# Patient Record
Sex: Female | Born: 1970
Health system: Southern US, Community
[De-identification: ages and names within clinical notes are randomized; demographics above are authoritative.]

## PROBLEM LIST (undated history)

## (undated) DIAGNOSIS — S299XXA Unspecified injury of thorax, initial encounter: Secondary | ICD-10-CM

## (undated) DIAGNOSIS — I639 Cerebral infarction, unspecified: Secondary | ICD-10-CM

## (undated) DIAGNOSIS — R519 Headache, unspecified: Secondary | ICD-10-CM

## (undated) DIAGNOSIS — Z973 Presence of spectacles and contact lenses: Secondary | ICD-10-CM

## (undated) DIAGNOSIS — K219 Gastro-esophageal reflux disease without esophagitis: Secondary | ICD-10-CM

## (undated) DIAGNOSIS — M1990 Unspecified osteoarthritis, unspecified site: Secondary | ICD-10-CM

## (undated) DIAGNOSIS — T7840XA Allergy, unspecified, initial encounter: Secondary | ICD-10-CM

## (undated) HISTORY — DX: Headache, unspecified: R51.9

## (undated) HISTORY — DX: Cerebral infarction, unspecified: I63.9

## (undated) HISTORY — DX: Allergy, unspecified, initial encounter: T78.40XA

## (undated) HISTORY — DX: Gastro-esophageal reflux disease without esophagitis: K21.9

---

## 1997-06-08 DIAGNOSIS — I639 Cerebral infarction, unspecified: Secondary | ICD-10-CM

## 1997-06-08 HISTORY — DX: Cerebral infarction, unspecified: I63.9

## 1998-06-08 HISTORY — PX: TUBAL LIGATION: SHX77

## 2000-07-22 ENCOUNTER — Other Ambulatory Visit: Admission: RE | Admit: 2000-07-22 | Discharge: 2000-07-22 | Payer: Self-pay | Admitting: Family Medicine

## 2002-08-24 ENCOUNTER — Encounter: Admission: RE | Admit: 2002-08-24 | Discharge: 2002-08-24 | Payer: Self-pay | Admitting: Orthopedic Surgery

## 2002-08-24 ENCOUNTER — Encounter: Payer: Self-pay | Admitting: Orthopedic Surgery

## 2003-03-28 ENCOUNTER — Encounter
Admission: RE | Admit: 2003-03-28 | Discharge: 2003-06-26 | Payer: Self-pay | Admitting: Physical Medicine & Rehabilitation

## 2004-01-31 ENCOUNTER — Encounter
Admission: RE | Admit: 2004-01-31 | Discharge: 2004-04-30 | Payer: Self-pay | Admitting: Physical Medicine & Rehabilitation

## 2004-07-30 ENCOUNTER — Encounter
Admission: RE | Admit: 2004-07-30 | Discharge: 2004-10-28 | Payer: Self-pay | Admitting: Physical Medicine & Rehabilitation

## 2004-07-31 ENCOUNTER — Ambulatory Visit: Payer: Self-pay | Admitting: Physical Medicine & Rehabilitation

## 2005-01-23 ENCOUNTER — Ambulatory Visit: Payer: Self-pay | Admitting: Physical Medicine & Rehabilitation

## 2005-01-23 ENCOUNTER — Encounter
Admission: RE | Admit: 2005-01-23 | Discharge: 2005-04-23 | Payer: Self-pay | Admitting: Physical Medicine & Rehabilitation

## 2005-07-15 ENCOUNTER — Encounter
Admission: RE | Admit: 2005-07-15 | Discharge: 2005-10-13 | Payer: Self-pay | Admitting: Physical Medicine & Rehabilitation

## 2005-07-15 ENCOUNTER — Ambulatory Visit: Payer: Self-pay | Admitting: Physical Medicine & Rehabilitation

## 2005-11-22 ENCOUNTER — Emergency Department (HOSPITAL_COMMUNITY): Admission: EM | Admit: 2005-11-22 | Discharge: 2005-11-22 | Payer: Self-pay | Admitting: Emergency Medicine

## 2006-01-04 ENCOUNTER — Encounter
Admission: RE | Admit: 2006-01-04 | Discharge: 2006-04-04 | Payer: Self-pay | Admitting: Physical Medicine & Rehabilitation

## 2006-01-04 ENCOUNTER — Ambulatory Visit: Payer: Self-pay | Admitting: Physical Medicine & Rehabilitation

## 2006-02-10 ENCOUNTER — Emergency Department (HOSPITAL_COMMUNITY): Admission: EM | Admit: 2006-02-10 | Discharge: 2006-02-10 | Payer: Self-pay | Admitting: Emergency Medicine

## 2006-06-08 HISTORY — PX: ARTHROSCOPY KNEE W/ DRILLING: SUR92

## 2006-06-22 ENCOUNTER — Encounter
Admission: RE | Admit: 2006-06-22 | Discharge: 2006-09-20 | Payer: Self-pay | Admitting: Physical Medicine & Rehabilitation

## 2006-06-22 ENCOUNTER — Ambulatory Visit: Payer: Self-pay | Admitting: Physical Medicine & Rehabilitation

## 2007-01-20 ENCOUNTER — Ambulatory Visit (HOSPITAL_BASED_OUTPATIENT_CLINIC_OR_DEPARTMENT_OTHER): Admission: RE | Admit: 2007-01-20 | Discharge: 2007-01-20 | Payer: Self-pay | Admitting: Orthopedic Surgery

## 2007-02-09 ENCOUNTER — Encounter: Admission: RE | Admit: 2007-02-09 | Discharge: 2007-02-09 | Payer: Self-pay | Admitting: Orthopedic Surgery

## 2007-06-09 HISTORY — PX: REPLACEMENT TOTAL KNEE: SUR1224

## 2007-06-14 ENCOUNTER — Inpatient Hospital Stay (HOSPITAL_COMMUNITY): Admission: RE | Admit: 2007-06-14 | Discharge: 2007-06-18 | Payer: Self-pay | Admitting: Orthopedic Surgery

## 2007-07-18 ENCOUNTER — Encounter: Admission: RE | Admit: 2007-07-18 | Discharge: 2007-10-16 | Payer: Self-pay | Admitting: Orthopedic Surgery

## 2007-10-17 ENCOUNTER — Encounter: Admission: RE | Admit: 2007-10-17 | Discharge: 2007-11-14 | Payer: Self-pay | Admitting: Orthopedic Surgery

## 2008-02-16 ENCOUNTER — Encounter: Admission: RE | Admit: 2008-02-16 | Discharge: 2008-03-15 | Payer: Self-pay | Admitting: Nurse Practitioner

## 2010-09-04 ENCOUNTER — Other Ambulatory Visit: Payer: Self-pay | Admitting: Family Medicine

## 2010-09-04 DIAGNOSIS — N920 Excessive and frequent menstruation with regular cycle: Secondary | ICD-10-CM

## 2010-09-08 ENCOUNTER — Ambulatory Visit
Admission: RE | Admit: 2010-09-08 | Discharge: 2010-09-08 | Disposition: A | Discharge status: Home / Self Care | Payer: PRIVATE HEALTH INSURANCE | Source: Ambulatory Visit | Attending: Family Medicine | Admitting: Family Medicine

## 2010-09-08 DIAGNOSIS — N920 Excessive and frequent menstruation with regular cycle: Secondary | ICD-10-CM

## 2010-10-21 NOTE — Op Note (Signed)
NAME:  Cheryl Reeves, Cheryl Reeves                 ACCOUNT NO.:  [id number]   MEDICAL RECORD NO.:  [id number]          PATIENT TYPE:  INP   LOCATION:  0002                         FACILITY:  [facility]   PHYSICIAN:  Deidre Ala, M.D.    DATE OF BIRTH:  [birth year]   DATE OF PROCEDURE:  06/14/2007  DATE OF DISCHARGE:                               OPERATIVE REPORT   PREOPERATIVE DIAGNOSIS:  End-stage severe degenerative joint disease  tricompartmental right knee.   POSTOPERATIVE DIAGNOSIS:  End-stage severe degenerative joint disease  tricompartmental right knee.   PROCEDURE:  Right total knee arthroplasty using cemented DePuy  components LCS type with rotating platform with MBT stem.   SURGEON:  Doristine Section.   ASSISTANT:  Phineas Semen, [facility]   ANESTHESIA:  General endotracheal with femoral nerve block.   Cultures none.  Drains were 2 medium Hemovac and 2 Autovac.   ESTIMATED BLOOD LOSS:  100 mL replaced.   Tourniquet time 1 hour 19 minutes.   PATHOLOGIC FINDINGS:  Diva is relatively young female who had her knee  arthroscoped in 2004 for chondromalacia of the femoral condyle and  synovectomy by Dr. Wyline Mood.  The patient had knee pain postoperatively  that continued, pain with some edema in Hoffa's fat pad and ultimately  was seen by me and failed conservative management for continued pain.  On 08/14, we took her to the operating room where we did a jumper's knee  excision on the knee scope.  She had a tender medial scar plica, but she  had a grade 4 defect under the medial tibial plateau that was flapped  upward and unstable.  Very unusual with cartilages peeling off measuring  1 cm x 1.5 cm.  She had a large medial plica with a grade 4 defect on  the medial femoral condyle with a piece of loose cartilage floating in  the joint over on the lateral side close to bone.  The plica was  rubbing.  She had a stellate degenerative anterior medial meniscus tear.  On the medial side, she had ablation  and microfracture on the medial  femoral condyle with debridement of the medial tibial plateau defect and  medial and lateral meniscectomies and abrasion chondroplasty.  She also  is a smoker.  In the postoperative period she did improve with her  jumper's knee tendinitis, but she continued to have significant knee  pain that she probably is going to have to go to total joint replacement  at some point.  She had significant arthritis in the knee and was  continued to have discomfort and the patient ultimately continued to  have pain which did not respond to general on the joint line especially  medially and she was aspirated several times postop but ultimately was  felt that she was going to come to total knee arthroplasty due to her  pain level.  At surgery today she had severe DJD on the medial femoral  condyle, medial tibial plateau and synovitis throughout.  Therefore she  was essentially an end stage even though very young for a multiplicity  of  reasons, she also has had a post partum stroke and was still a  smoker.  She was in the process of quitting smoking and has been on  Tegretol.  Therefore at surgery we replaced her with a standard right  femur.  A #3 revision cemented tray.  We used a universal stem fluted 75  x 14 with MBT stem to give long life to her knee as a young active  person as per my current standard.  We used a 12.5 insert.  We used a 32  mm over dome patella peg and Smart Set cement with Tobramycin two  batches with 1.3 grams per batch.  We had full extension, flexion to 105  degrees with no anterior drawer, stable varus-valgus stressing and good  overall alignment.   PROCEDURE:  With adequate anesthesia obtained using LMA technique after  femoral nerve block.  The patient was placed in supine position.  The  right lower extremity was prepped from the toes to the tourniquet in  standard fashion.  After standard prepping and draping, Esmarch  exsanguination was  used and the tourniquet was let up to 350 mmHg.  The  old midline jumper's knee excision was utilized and the straight  incision was then made over the middle third of the patella and incision  deepened sharply with adequate hemostasis obtained using the Bovie  electrocoagulated.  Small flaps were developed medially and laterally  and then the median parapatellar retinacular incision was made.  Patella  was everted and the fat pad was excised which was rather scarred.  We  then flexed the knee and removed the cruciates and both menisci.  I then  amputated the tibial spine flap and drilled down the intramedullary  canal.  Subsequently reaming up to a 14.  The tibial cutting jig was put  in place and a 2 degrees cut made.  We then sized the femur to a  standard.  We placed the intramedullary guide and used the C clamp, set  it, pinned it, felt it was still very tight for even a 10 and moved it  up 2.5 mm and made the anterior-posterior cuts.  Still tight in flexion  so cut 2.5 more on the tibia side.  That then accommodated a 12.5 in  flexion.  We then placed a 4 degrees valgus distal femoral cutting jig  in place and made that cut, subsequently cut 2 more and fit the 12.5 in  extension and flexion.  We then placed the finishing guide on the distal  femur to make the anterior-posterior chamfer cuts as well as the notch  cut.  We exposed the tibia sized to a 3, drilled centrally down with the  proximal conical reamer and ultimately reamed line-to-line to a 14 and  placed the 14 x 75 stem with a trial in place with the appropriate  rotation.  Then packed it down.  We then placed on the 10 and 12 and  decided we would trial prior to final implant on the rotating platform.  We placed on the femoral component and articulated the knee through a  range of motion.  I then callipered the patella to a 24, cut it down  with the cutting jig to a 15 replacing 8 with a 32 placed 3-peg holes  and then a  trial patella.  All trial components were then removed and  thorough jet lavaged of the knee was carried out as we brought the  components on the field  and checked for sizes.  We then mixed cement  with tobramycin and mixed it in the cement gun.  We then cemented on the  tibial component and impacted it and removed excess cement.  We had pre  assembled the tibial stem.  We then placed on the trial 12 rotating  platform cemented on the femoral component and impacted it and removed  excess cement, held in full extension, felt that was the best way to go  so then placed the 12.5 permanent implant in place and held the knee in  full extension while we removed excess cement from the femur.  We then  cemented on the patella component and packed it and removed excess  cement and held with a clamp until the cement cured.  When the cement  was set additional jet lavage was carried out.  The tourniquet was let  down and bleeding points were cauterized.  We then placed FloSeal in the  wound.  Hemovac drains were then placed in the lateral gutter and  brought out the superior lateral portal.  The Hemovac was hooked up to  that.  The wound was then closed in layers with #1 figure-of-eight  Vicryl sutures on the retinaculum with a running locking oversew of #1  PDS and 0, 2-0 and 3-0 Vicryl was then used on the subcu and a running 3-  0 Monocryl with Steri-Strips were placed.  A bulky sterile compressive  dressing was then applied.  Hemovac was hooked up to Autovac.  The  patient then having tolerated procedure well was awakened, placed in the  knee immobilizer, taken to the recovery room for routine postoperative  care, analgesia and CPM.           ______________________________  V. [name], M.D.     VEP/MEDQ  D:  06/14/2007  T:  06/14/2007  Job:  [id number]   cc:   [facility]

## 2010-10-21 NOTE — Op Note (Signed)
NAME:  Cheryl Reeves, Cheryl Reeves                 ACCOUNT NO.:  [id number]   MEDICAL RECORD NO.:  [id number]          PATIENT TYPE:  AMB   LOCATION:  NESC                         FACILITY:  [facility]   PHYSICIAN:  Deidre Ala, M.D.    DATE OF BIRTH:  18-Aug-1970   DATE OF PROCEDURE:  DATE OF DISCHARGE:                               OPERATIVE REPORT   PREOPERATIVE DIAGNOSES:  1. Right right knee jumpers knee, patellar tendinitis, with anterior      patellofemoral syndrome.  2. Hoffa's fat pad scarring  3. Medial plica.   POSTOPERATIVE DIAGNOSES:  1. Anterior medial meniscus tear.  2. Stellate degenerative unstable lateral meniscus tear.  3. Grade 4 defect of medial femoral condyle under plica.  4. Grade 4 defect with flap, anterior medial tibial plateau, unstable      osteochondral defect.  5. Medial and lateral plicas, scar type.  6. Tight lateral retinaculum.  7. Jumper's knee, patellar tendinitis inferior pole patella, entire      tendon down to tubercle.   PROCEDURE:  1. Right knee operative arthroscopy.  2. Partial lateral and medial meniscectomies.  3. Abrasion chondroplasty, ablation and microfracture of medial      femoral condyle defect and medial tibial plateau defect.  4. Lateral retinacular release.  5. Arthroscopic medial and lateral plica excisions.  6. Open excision of jumper's knee tendinitis of central patellar      tendon with drilling inferior pole patella and tubercle with      central longitudinal repair.   SURGEON:  Doristine Section, M.D.   ASSISTANT:  Phineas Semen, P.A.   ANESTHESIA:  General endotracheal.   CULTURES:  None.   DRAINS:  None.   ESTIMATED BLOOD LOSS:  Minimal.   TOURNIQUET TIME:  1 hour 17 minutes.   PATHOLOGIC FINDINGS AND HISTORY:  Cheryl Reeves has had history of knee pain.  She had surgery November 2007 by Dr. Thurston Hole.  Chondromalacia of the  femoral condyle was debrided, synovectomy carried out.  She did well  until the end of the last year when she  began having increasing pain of  the knee.  Initial evaluation suggested iliotibial band tendinitis,  which ultimately cleared.  She then continued have discomfort even after  the injection of the iliotibial band tendinitis.  The pain is now more  anteriorly, and she had a new MRI which showed edema in Hoffa's fat pad  anteriorly suggesting postsurgical synovitis.  There was an amorphous  signal over the anterior horn lateral meniscus compared with prior  examination.  Everything else looked normal.  The iliotibial band looked  normal.  We injected her with cortisone.  We told her to stop smoking.  She continually was tender over the anterior patella, central third  inferior pole down to the tubercle, a feeling of tightness  retropatellar, tenderness along the medial femoral condyle with a  palpable plica, and her exquisite pain was over the medial femoral  condyle as well as the patellar tendon.  She seemed to have a somewhat  tight lateral retinaculum.  We therefore elected to proceed after  thorough discussion of the fact that this would be a diagnostic maneuver  with arthroscopy, we would do what needed to be taken care of as well as  a classic Bassett type jumper's knee excision and repair.  That is Dr.  Cleophas Dunker of [facility] who wrote this up years ago on the jumpers  knee.  This was thoroughly explained to the patient and family.  We were  surprised to find at surgery an almost quarter-size defect over the  medial femoral condyle directly under the plica down to bone with a  ragged cartilage edge that we debrided smooth and did microfracture on  because it was raw bone.  We removed the plica over top.  We then  explored the anterior medial meniscus, which had  degenerative anterior  horn fraying, but there was a flap that when the probe was put in it,  the anterior one-third of the medial tibial plateau and somewhat  underneath the anterior medial meniscus flapped up off the  cartilage as  if it had dissociated.  This was debrided to a smooth rim defect and  also microfractured.  I think this was her area of greatest  symptomatology.  There was a loose body of cartilage in the lateral  joint that was from the medial femoral condyle.  There was lateral scar  plica.  There was a very tight lateral retinaculum, which was probably  contributing to her feeling of knee pain on flexion and overall  tightness.  The trochlea had some minor defect changes.  She was soft in  the posterior patella, but not broken down.  There was scar tissue in  the anterior notch.  The ACL was intact, but the lateral meniscus had a  significant anterior horn lateral meniscus tear, degenerative type,  unstable from front to the posterior one third that we debrided  completely out and tapered back smooth.  Therefore, she had a lot going  on in the knee.  We then did the central incision open into the patellar  tendon, excised the origin of the central patellar tendon off the  inferior pole, similar to what one does in a tennis elbow type inertial  procedure, and then multiply drilled it was 0.62 K wire to stimulate  neovascularity as well as distal.  I took out a strip of tendon distally  to stimulate healing in the central tendon where she was tender, and  also stripped the peritenon off and left it off for her patellar  tendinitis.  There was some minor necrotic tissue on the inferior pole  of the patella as well as distally.  The rest of the tendon looked  normal.  Also, I checked the Hoffa's fat pad from both the scope side as  well as through the tendon arthrotomy and did not find a pannus of  significant scar tissue that had to be excised along the anterior fat  pad.  I think she was feeling tightness because of the tight lateral  retinaculum.  She did have some synovitis underneath the inferior pole  patella inside the joint, which I resected, which may have been an area  of  tenderness also.  I think she was feeling it just in the insertional  point of the central tendon on the patellar tendon as well as the  tubercle.   PROCEDURE:  With adequate anesthesia obtained using LMA technique, 1  gram of Ancef given IV prophylaxis and another one at tourniquet let  down due  to the open procedure.  The patient was placed in the supine  position.  The right lower extremity was prepped from the malleoli to  the leg holder in the standard fashion.  After standard prepping and  draping, Esmarch exsanguination was used.  The tourniquet was let up 350  mmHg.  Superior lateral inflow portal was made, and the knee was  insufflated with normal saline with the arthroscopic pump.  Medial and  lateral scope portals were then made, and the joint was thoroughly  inspected.  I then shaved the medial femoral condyle defect directly  under the plica, smoothed the lateral edges.  There was 1 bony  excrescence that I thought was rubbing, especially under soft tissues.  I took that off with a bur.  I then used the ablator to smooth the edges  and microfractured the defect to stimulate fibrocartilage formation.  I  then checked the anterior medial meniscus and posterior, shaved the  anterior medial meniscus, smoothed it with the ablator and then found  the defect that was significant on the anterior medial tibial plateau,  flapped it up and debrided that to a stable rimmed defect using the  ablator on one to smooth and then later microfractured it also.  I then  checked the ACL.  I checked the lateral meniscus, finding the stellate  lateral meniscus tear and used basket and shaver and ablator to smooth  that down to a stable rim front to back.  I then shaved the under  surface patellar tendinitis, lateral gutter synovitis and scar tissue.  I then felt this very tight lateral retinaculum that I could not get the  scope underneath, so I did an arthroscopic lateral retinacular release   from vastus lateralis to the joint line, improving tilt and track.  I  then checked the defects again on the other side from another angle.  The knee was irrigated through the scope.  I then painted with Betadine  and made a longitudinal incision as one makes for autograft for an ACL  over the inferior pole patella and down the tendon.  Incision deepened  sharply, and adequate hemostasis was obtained using the Bovie  electrocoagulator.  I then split the patellar tendon longitudinally and  debrided its insertion centrally off the inferior pole patella and  drilled multiply with 0.62 K-wire to stimulate neovascularity.  I also  drilled distally on the tubercle as she was exquisitely tender there  preoperatively.  A small central component of the patellar tendon was  then debrided.  There was some old cortisone in that, and then I closed  fresh edges back after checking for scar pannus on Hoffa's fat pad  inside, which did not need to be resected.  Them I did a running locking  0-Vicryl suture down the tendon to repair it.  I did not repair the  paratenon, which had been excised and stripped off.  The subcu was  closed with 2-0 and 3-0 Vicryl and the skin with a running 3-0 Monocryl  with Steri-Strips.  0.5% Marcaine with morphine was injected in and  about the wound and the portals.  The medial and lateral portal were  left open.  The anterior joint line portal was closed with 4-0 nylon.  A  bulky sterile compressive dressing was applied with lateral a foam pad  and tamponade.  For tamponade, an eZY Wrap placed.  The patient then  having tolerated the procedure well was awakened, taken to the recovery  room in satisfactory condition to be discharged per outpatient routine  with crutches, weightbearing as tolerated and knee immobilizer.  Told  call the office for recheck tomorrow.           ______________________________  V. [name], M.D.     VEP/MEDQ  D:  01/20/2007  T:  01/21/2007   Job:  [id number]

## 2010-10-21 NOTE — Discharge Summary (Signed)
NAME:  Cheryl Reeves, Cheryl Reeves                 ACCOUNT NO.:  [id number]   MEDICAL RECORD NO.:  [id number]          PATIENT TYPE:  INP   LOCATION:  1602                         [facility]ACILITY:  [facility]   PHYSICIAN:  Deidre Ala, M.D.    DATE OF BIRTH:  November 25, 1970   DATE OF ADMISSION:  06/14/2007  DATE OF DISCHARGE:  06/18/2007                               DISCHARGE SUMMARY   FINAL DIAGNOSES:  1. Degenerative joint disease, right knee.  2. Acute blood loss anemia postoperative, stable.  3. History of stroke with residual right-sided weakness.  4. History of tobacco abuse.   PROCEDURES:  Right total knee arthroplasty on June 14, 2007.   SURGEON:  1. [name], M.D.   HISTORY:  This is a 40 year old Caucasian female with a long history of  right knee pain.  She has had arthroscopy for repair of jumper's knee on  January 20, 2007.  She continued to have significant pain.  She has pain  with walking at night.  She has had aspiration of her effusions, and at  this time, she has failed all medical treatments and was ready for a  total knee replacement.  Subsequently, she is scheduled for one.   HOSPITAL COURSE:  Patient is admitted to [facility] on June 14, 2007.  At that time, she underwent a right total knee arthroplasty.  Patient tolerated the procedure well.  No intraoperative complications  occurred.  Postoperatively, the patient did have a Autovac put in, and  in the PACU, she did receive 400 cc back of blood.  Other than that, she  required no other blood products.  Her hemoglobin had drifted down to  9.3 with a hematocrit of 26.4.  She continued to do well otherwise,  although she continued to have pain that was more difficult to control  than usual.  She was on a PCA pump.  We did DC this on the third  postoperative day.  The incision was healing well.  She was in an CMP  machine.  She was working with physical therapy.  She is still having  pain and difficulty moving about.   At this time on the third postoperative day, we DC'd, as noted, the PCA  pump and all IV fluids.  I encouraged her to get out of bed.  She  continues to use the CPM.  She prepared for discharge and will be  discharged on the fourth postoperative day, which will be June 18, 2007.   DISCHARGE MEDICATIONS:  At the time of discharge, her medications were:  1. Tegretol 200 mg b.i.d.  2. Claritin 10 mg daily.  3. Lioresal 10 mg daily.  4. Nasonex 2 sprays daily.  5. Percocet 5/325 1-2 p.o. q.4-6h. p.r.n. pain, 50 of these with no      refills.  6. Lovenox 30 mg subcu q.12h. for the next 10 days.  7. Robaxin 500 mg q.6h. p.r.n.   Patient has an allergy to CODEINE, which causes a rash.   Patient has done well, and we are planning on discharging her on  June 18, 2007 in satisfactory, stable condition.  She will follow up with Dr.  Renae Fickle in approximately 10 days.      Phineas Semen, P.A.    ______________________________  Seth Bake. [name], M.D.    CL/MEDQ  D:  06/17/2007  T:  06/17/2007  Job:  [id number]
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
## 2011-02-26 LAB — CBC
HCT: 26.4 — ABNORMAL LOW
HCT: 30.1 — ABNORMAL LOW
MCHC: 34.8
MCHC: 35.2
MCV: 92.1
MCV: 92.8
Platelets: 176
Platelets: 185
Platelets: 196
RBC: 2.82 — ABNORMAL LOW
WBC: 5.6
WBC: 7.1
WBC: 8.7

## 2011-02-26 LAB — BASIC METABOLIC PANEL
BUN: 3 — ABNORMAL LOW
BUN: 4 — ABNORMAL LOW
CO2: 27
Calcium: 7.8 — ABNORMAL LOW
Chloride: 106
Chloride: 109
Creatinine, Ser: 0.54
Creatinine, Ser: 0.62
GFR calc Af Amer: >60
Glucose, Bld: 121 — ABNORMAL HIGH
Potassium: 3.8

## 2011-02-26 LAB — TYPE AND SCREEN: ABO/RH(D): A POS

## 2011-03-13 LAB — COMPREHENSIVE METABOLIC PANEL
Alkaline Phosphatase: 82
BUN: 8
Chloride: 104
Creatinine, Ser: 0.63
GFR calc non Af Amer: >60
Glucose, Bld: 97
Potassium: 4.5
Total Bilirubin: 0.9

## 2011-03-13 LAB — DIFFERENTIAL
Basophils Absolute: 0
Basophils Relative: 0
Neutro Abs: 4.4
Neutrophils Relative %: 69

## 2011-03-13 LAB — CBC
HCT: 39
Hemoglobin: 13.7
MCV: 92.6
WBC: 6.4

## 2011-03-13 LAB — PREGNANCY, URINE

## 2011-03-13 LAB — APTT: aPTT: 26

## 2011-03-13 LAB — PROTIME-INR
INR: 1
Prothrombin Time: 13.4

## 2011-03-13 LAB — URINALYSIS, ROUTINE W REFLEX MICROSCOPIC
Bilirubin Urine: NEGATIVE
Ketones, ur: NEGATIVE
Nitrite: NEGATIVE
Protein, ur: NEGATIVE
pH: 7.5

## 2011-03-13 LAB — URINE CULTURE: Colony Count: NO GROWTH

## 2011-03-23 LAB — POCT PREGNANCY, URINE: Preg Test, Ur: NEGATIVE

## 2011-03-23 LAB — POCT HEMOGLOBIN-HEMACUE: Hemoglobin: 14.2

## 2012-11-21 ENCOUNTER — Telehealth: Payer: Self-pay | Admitting: Nurse Practitioner

## 2012-11-22 ENCOUNTER — Telehealth: Payer: Self-pay | Admitting: Nurse Practitioner

## 2012-11-22 NOTE — Telephone Encounter (Signed)
Called Wellbutrin to [facility] in [place]. [phone]. Patient notified

## 2012-11-22 NOTE — Telephone Encounter (Signed)
ALREADY ADDRESSED

## 2012-11-22 NOTE — Telephone Encounter (Signed)
Please advise 

## 2012-11-22 NOTE — Telephone Encounter (Signed)
Please call in rx

## 2012-11-22 NOTE — Telephone Encounter (Signed)
Please call in wellbutrin for her- pharmacy number in note

## 2012-11-22 NOTE — Telephone Encounter (Signed)
Already addressed

## 2012-12-05 ENCOUNTER — Ambulatory Visit (INDEPENDENT_AMBULATORY_CARE_PROVIDER_SITE_OTHER): Payer: No Typology Code available for payment source | Admitting: Family Medicine

## 2012-12-05 ENCOUNTER — Encounter: Payer: Self-pay | Admitting: Family Medicine

## 2012-12-05 ENCOUNTER — Telehealth: Payer: Self-pay | Admitting: Nurse Practitioner

## 2012-12-05 VITALS — BP 141/87 | HR 79 | Temp 97.1°F | Wt 187.2 lb

## 2012-12-05 DIAGNOSIS — J329 Chronic sinusitis, unspecified: Secondary | ICD-10-CM

## 2012-12-05 DIAGNOSIS — J029 Acute pharyngitis, unspecified: Secondary | ICD-10-CM

## 2012-12-05 DIAGNOSIS — R52 Pain, unspecified: Secondary | ICD-10-CM

## 2012-12-05 LAB — POCT RAPID STREP A (OFFICE): Rapid Strep A Screen: NEGATIVE

## 2012-12-05 LAB — POCT INFLUENZA A/B
Influenza A, POC: NEGATIVE
Influenza B, POC: NEGATIVE

## 2012-12-05 MED ORDER — SULFAMETHOXAZOLE-TRIMETHOPRIM 800-160 MG PO TABS: 1.0000 | ORAL_TABLET | Freq: Two times a day (BID) | ORAL | Status: DC

## 2012-12-05 MED ORDER — FLUCONAZOLE 150 MG PO TABS: 150.0000 mg | ORAL_TABLET | Freq: Once | ORAL | Status: DC

## 2012-12-05 MED ORDER — METHYLPREDNISOLONE 4 MG PO KIT: PACK | ORAL | Status: DC

## 2012-12-05 NOTE — Progress Notes (Signed)
  Subjective:    Patient ID: Cheryl Reeves, female    DOB: 17-Nov-1970, 42 y.o.   MRN: [id number]  HPI  This 42 y.o. female presents for evaluation of URI sx's for over a week.  She has been having sore throat, congestion, cough, fatigue and malaise.  She has been having chills.  She states she has a lot of sinus issues and she has had a lot of sinus infections in the past.  Review of Systems C/o sinus congestion and sore throat.  No chest pain, SOB, HA, dizziness, vision change, N/V, diarrhea, constipation, dysuria, urinary urgency or frequency, myalgias, arthralgias or rash.     Objective:   Physical Exam  Vital signs noted  Well developed well nourished female.  HEENT - Head atraumatic Normocephalic                Eyes - PERRLA, Conjuctiva - clear Sclera- Clear EOMI                Ears - EAC's Wnl TM's Wnl Gross Hearing WNL                Nose - Nares patent                 Throat - oropharanx injected. Respiratory - Lungs CTA bilateral Cardiac - RRR S1 and S2 without murmur       Results for orders placed in visit on 12/05/12  POCT RAPID STREP A (OFFICE)      Result Value Range   Rapid Strep A Screen Negative  Negative  POCT INFLUENZA A/B      Result Value Range   Influenza A, POC Negative     Influenza B, POC Negative     Assessment & Plan:  Body aches - Plan: POCT rapid strep A, POCT Influenza A/B, sulfamethoxazole-trimethoprim (BACTRIM DS,SEPTRA DS) 800-160 MG per tablet, methylPREDNISolone (MEDROL, PAK,) 4 MG tablet, fluconazole (DIFLUCAN) 150 MG tablet  Sore throat - Plan: POCT rapid strep A, POCT Influenza A/B, sulfamethoxazole-trimethoprim (BACTRIM DS,SEPTRA DS) 800-160 MG per tablet, methylPREDNISolone (MEDROL, PAK,) 4 MG tablet, fluconazole (DIFLUCAN) 150 MG tablet  Sinusitis, chronic - Plan: sulfamethoxazole-trimethoprim (BACTRIM DS,SEPTRA DS) 800-160 MG per tablet, methylPREDNISolone (MEDROL, PAK,) 4 MG tablet, fluconazole (DIFLUCAN) 150 MG tablet

## 2012-12-05 NOTE — Telephone Encounter (Signed)
APPT GIVEN

## 2012-12-12 ENCOUNTER — Other Ambulatory Visit: Payer: Self-pay | Admitting: Nurse Practitioner

## 2012-12-14 NOTE — Telephone Encounter (Signed)
LAST 1/14. MEDICINE NOT ON EPIC MED LIST BUT IN PAPER CHART. LAST RF 11/13/12.

## 2012-12-15 NOTE — Patient Instructions (Signed)

## 2012-12-30 ENCOUNTER — Encounter: Payer: Self-pay | Admitting: Family Medicine

## 2012-12-30 ENCOUNTER — Ambulatory Visit (INDEPENDENT_AMBULATORY_CARE_PROVIDER_SITE_OTHER): Payer: No Typology Code available for payment source | Admitting: Family Medicine

## 2012-12-30 ENCOUNTER — Ambulatory Visit (INDEPENDENT_AMBULATORY_CARE_PROVIDER_SITE_OTHER): Payer: No Typology Code available for payment source

## 2012-12-30 ENCOUNTER — Telehealth: Payer: Self-pay | Admitting: Family Medicine

## 2012-12-30 VITALS — BP 115/80 | HR 80 | Temp 99.4°F | Ht 66.0 in | Wt 190.0 lb

## 2012-12-30 DIAGNOSIS — M79609 Pain in unspecified limb: Secondary | ICD-10-CM

## 2012-12-30 DIAGNOSIS — M79671 Pain in right foot: Secondary | ICD-10-CM

## 2012-12-30 DIAGNOSIS — F329 Major depressive disorder, single episode, unspecified: Secondary | ICD-10-CM

## 2012-12-30 DIAGNOSIS — M21371 Foot drop, right foot: Secondary | ICD-10-CM

## 2012-12-30 DIAGNOSIS — M216X9 Other acquired deformities of unspecified foot: Secondary | ICD-10-CM

## 2012-12-30 DIAGNOSIS — R569 Unspecified convulsions: Secondary | ICD-10-CM

## 2012-12-30 MED ORDER — CYCLOBENZAPRINE HCL 10 MG PO TABS: 10.0000 mg | ORAL_TABLET | Freq: Three times a day (TID) | ORAL | Status: DC | PRN

## 2012-12-30 MED ORDER — BACLOFEN 10 MG PO TABS: 10.0000 mg | ORAL_TABLET | Freq: Three times a day (TID) | ORAL | Status: DC

## 2012-12-30 MED ORDER — BUPROPION HCL ER (XL) 150 MG PO TB24: 150.0000 mg | ORAL_TABLET | Freq: Every day | ORAL | Status: DC

## 2012-12-30 MED ORDER — NAPROXEN 500 MG PO TABS: 500.0000 mg | ORAL_TABLET | Freq: Two times a day (BID) | ORAL | Status: DC

## 2012-12-30 MED ORDER — CARBAMAZEPINE ER 200 MG PO CP12: 200.0000 mg | ORAL_CAPSULE | Freq: Two times a day (BID) | ORAL | Status: DC

## 2012-12-30 NOTE — Patient Instructions (Signed)
Tendinitis  Tendinitis is swelling and inflammation of the tendons. Tendons are band-like tissues that connect muscle to bone. Tendinitis commonly occurs in the:    Shoulders (rotator cuff).   Heels (Achilles tendon).   Elbows (triceps tendon).  CAUSES  Tendinitis is usually caused by overusing the tendon, muscles, and joints involved. When the tissue surrounding a tendon (synovium) becomes inflamed, it is called tenosynovitis. Tendinitis commonly develops in people whose jobs require repetitive motions.  SYMPTOMS   Pain.   Tenderness.   Mild swelling.  DIAGNOSIS  Tendinitis is usually diagnosed by physical exam. Your caregiver may also order X-rays or other imaging tests.  TREATMENT  Your caregiver may recommend certain medicines or exercises for your treatment.  HOME CARE INSTRUCTIONS    Use a sling or splint for as long as directed by your caregiver until the pain decreases.   Put ice on the injured area.   Put ice in a plastic bag.   Place a towel between your skin and the bag.   Leave the ice on for 15-20 minutes, 3-4 times a day.   Avoid using the limb while the tendon is painful. Perform gentle range of motion exercises only as directed by your caregiver. Stop exercises if pain or discomfort increase, unless directed otherwise by your caregiver.   Only take over-the-counter or prescription medicines for pain, discomfort, or fever as directed by your caregiver.  SEEK MEDICAL CARE IF:    Your pain and swelling increase.   You develop new, unexplained symptoms, especially increased numbness in the hands.  MAKE SURE YOU:    Understand these instructions.   Will watch your condition.   Will get help right away if you are not doing well or get worse.  Document Released: 05/22/2000 Document Revised: 08/17/2011 Document Reviewed: 08/11/2010  [employer] Patient Information 2014 [employer].

## 2012-12-30 NOTE — Telephone Encounter (Signed)
Resolved in another encounter.

## 2012-12-30 NOTE — Progress Notes (Signed)
  Subjective:    Patient ID: Cheryl Reeves, female    DOB: 1970/10/24, 42 y.o.   MRN: [id number]  HPI This 42 y.o. female presents for evaluation of pain and discomfort in her Right foot for a week.  She has hx of right foot drop/palsy due to cva  She had during pregnancy.  She wears an AFO foot device.  She gets Cramps in her right foot and is on baclofen for spasticity in her right  Foot.  She states she has chronic foot pain and discomfort. She also needs refills on her depression medicine and her siezure medicine.  Review of Systems C/o right foot pain and foot drop. No chest pain, SOB, HA, dizziness, vision change, N/V, diarrhea, constipation, dysuria, urinary urgency or frequency or rash.     Objective:   Physical Exam Vital signs noted  Well developed well nourished female.  HEENT - Head atraumatic Normocephalic                Throat - oropharanx wnl Respiratory - Lungs CTA bilateral Cardiac - RRR S1 and S2 without murmur GI - Abdomen soft Nontender and bowel sounds active x 4 Extremities - Right foot tender along the dorsal aspect and some swelling on The first and second metatarsal region Neuro - Grossly intact.  Prelim xray read right foot - Normal right foot.     Assessment & Plan:  Foot pain, right - Plan: DG Foot Complete Right, naproxen (NAPROSYN) 500 MG tablet, cyclobenzaprine (FLEXERIL) 10 MG tablet, Ambulatory referral to Orthopedic Surgery, baclofen (LIORESAL) 10 MG tablet  Right foot drop - Plan: Ambulatory referral to Orthopedic Surgery, baclofen (LIORESAL) 10 MG tablet  Seizures - Plan: carbamazepine (CARBATROL) 200 MG 12 hr capsule  Depression - Plan: buPROPion (WELLBUTRIN XL) 150 MG 24 hr tablet

## 2013-01-19 ENCOUNTER — Other Ambulatory Visit: Payer: Self-pay | Admitting: Nurse Practitioner

## 2013-03-11 ENCOUNTER — Other Ambulatory Visit: Payer: Self-pay | Admitting: Nurse Practitioner

## 2013-03-13 NOTE — Telephone Encounter (Signed)
last seen 12/30/12  B [place]

## 2013-06-26 ENCOUNTER — Other Ambulatory Visit: Payer: Self-pay | Admitting: Family Medicine

## 2013-06-28 NOTE — Telephone Encounter (Signed)
Last seen 12/2012, needs labs. If refused route to pool a so pt can be notified

## 2013-06-29 ENCOUNTER — Ambulatory Visit: Payer: PRIVATE HEALTH INSURANCE | Attending: Orthopedic Surgery | Admitting: Physical Therapy

## 2013-06-29 ENCOUNTER — Telehealth: Payer: Self-pay | Admitting: Nurse Practitioner

## 2013-06-29 DIAGNOSIS — IMO0001 Reserved for inherently not codable concepts without codable children: Secondary | ICD-10-CM | POA: Insufficient documentation

## 2013-06-29 DIAGNOSIS — Z96659 Presence of unspecified artificial knee joint: Secondary | ICD-10-CM | POA: Insufficient documentation

## 2013-06-29 DIAGNOSIS — M25676 Stiffness of unspecified foot, not elsewhere classified: Secondary | ICD-10-CM | POA: Insufficient documentation

## 2013-06-29 DIAGNOSIS — M25673 Stiffness of unspecified ankle, not elsewhere classified: Secondary | ICD-10-CM | POA: Insufficient documentation

## 2013-06-29 DIAGNOSIS — R5381 Other malaise: Secondary | ICD-10-CM | POA: Insufficient documentation

## 2013-06-29 DIAGNOSIS — M25579 Pain in unspecified ankle and joints of unspecified foot: Secondary | ICD-10-CM | POA: Insufficient documentation

## 2013-06-29 DIAGNOSIS — I69959 Hemiplegia and hemiparesis following unspecified cerebrovascular disease affecting unspecified side: Secondary | ICD-10-CM | POA: Insufficient documentation

## 2013-06-29 NOTE — Telephone Encounter (Signed)
appt offered for today but she wanted an appt for tomorrow [facility]

## 2013-06-30 ENCOUNTER — Ambulatory Visit (INDEPENDENT_AMBULATORY_CARE_PROVIDER_SITE_OTHER): Payer: PRIVATE HEALTH INSURANCE | Admitting: Family Medicine

## 2013-06-30 ENCOUNTER — Encounter: Payer: Self-pay | Admitting: Family Medicine

## 2013-06-30 VITALS — BP 139/82 | HR 73 | Temp 97.4°F | Ht 66.0 in | Wt 201.8 lb

## 2013-06-30 DIAGNOSIS — J329 Chronic sinusitis, unspecified: Secondary | ICD-10-CM

## 2013-06-30 MED ORDER — METHYLPREDNISOLONE (PAK) 4 MG PO TABS: ORAL_TABLET | ORAL | Status: DC

## 2013-06-30 MED ORDER — FLUCONAZOLE 150 MG PO TABS: 150.0000 mg | ORAL_TABLET | Freq: Once | ORAL | Status: DC

## 2013-06-30 MED ORDER — LEVOFLOXACIN 500 MG PO TABS: 500.0000 mg | ORAL_TABLET | Freq: Every day | ORAL | Status: DC

## 2013-06-30 NOTE — Progress Notes (Signed)
   Subjective:    Patient ID: Cheryl Reeves, female    DOB: 04-Jul-1970, 43 y.o.   MRN: [id number]  HPI This 43 y.o. female presents for evaluation of facial discomfort and uri sx's.   Review of Systems C/o uri sx's No chest pain, SOB, HA, dizziness, vision change, N/V, diarrhea, constipation, dysuria, urinary urgency or frequency, myalgias, arthralgias or rash.     Objective:   Physical Exam  Vital signs noted  Well developed well nourished female.  HEENT - Head atraumatic Normocephalic                Eyes - PERRLA, Conjuctiva - clear Sclera- Clear EOMI                Ears - EAC's Wnl TM's Wnl Gross Hearing WNL                Nose - Nares patent                 Throat - oropharanx wnl Respiratory - Lungs CTA bilateral Cardiac - RRR S1 and S2 without murmur GI - Abdomen soft Nontender and bowel sounds active x 4 Extremities - No edema. Neuro - Grossly intact.      Assessment & Plan:  Sinusitis - Plan: methylPREDNIsolone (MEDROL DOSPACK) 4 MG tablet, levofloxacin (LEVAQUIN) 500 MG tablet, fluconazole (DIFLUCAN) 150 MG tablet  Deatra CanterWilliam J Faron Whitelock FNP

## 2013-07-03 ENCOUNTER — Ambulatory Visit: Payer: PRIVATE HEALTH INSURANCE | Admitting: Physical Therapy

## 2013-07-05 ENCOUNTER — Ambulatory Visit: Payer: PRIVATE HEALTH INSURANCE | Admitting: Physical Therapy

## 2013-07-11 ENCOUNTER — Ambulatory Visit: Payer: PRIVATE HEALTH INSURANCE | Attending: Orthopedic Surgery | Admitting: Physical Therapy

## 2013-07-11 DIAGNOSIS — M25676 Stiffness of unspecified foot, not elsewhere classified: Secondary | ICD-10-CM | POA: Insufficient documentation

## 2013-07-11 DIAGNOSIS — M25579 Pain in unspecified ankle and joints of unspecified foot: Secondary | ICD-10-CM | POA: Insufficient documentation

## 2013-07-11 DIAGNOSIS — Z96659 Presence of unspecified artificial knee joint: Secondary | ICD-10-CM | POA: Insufficient documentation

## 2013-07-11 DIAGNOSIS — R5381 Other malaise: Secondary | ICD-10-CM | POA: Insufficient documentation

## 2013-07-11 DIAGNOSIS — M25673 Stiffness of unspecified ankle, not elsewhere classified: Secondary | ICD-10-CM | POA: Insufficient documentation

## 2013-07-11 DIAGNOSIS — I69959 Hemiplegia and hemiparesis following unspecified cerebrovascular disease affecting unspecified side: Secondary | ICD-10-CM | POA: Insufficient documentation

## 2013-07-11 DIAGNOSIS — IMO0001 Reserved for inherently not codable concepts without codable children: Secondary | ICD-10-CM | POA: Insufficient documentation

## 2013-07-13 ENCOUNTER — Ambulatory Visit: Payer: PRIVATE HEALTH INSURANCE | Admitting: Physical Therapy

## 2013-07-18 ENCOUNTER — Ambulatory Visit: Payer: PRIVATE HEALTH INSURANCE | Admitting: *Deleted

## 2013-07-19 ENCOUNTER — Encounter (HOSPITAL_BASED_OUTPATIENT_CLINIC_OR_DEPARTMENT_OTHER): Payer: Self-pay | Admitting: *Deleted

## 2013-07-19 NOTE — Progress Notes (Signed)
Pt had a mild perinatal cranial bleed-weakness rt leg No dvts No cardiac-only allergies and sinus problems

## 2013-07-20 ENCOUNTER — Ambulatory Visit: Payer: PRIVATE HEALTH INSURANCE | Admitting: *Deleted

## 2013-07-21 ENCOUNTER — Encounter (HOSPITAL_BASED_OUTPATIENT_CLINIC_OR_DEPARTMENT_OTHER)
Admission: RE | Disposition: A | Discharge status: Home / Self Care | Payer: Self-pay | Source: Ambulatory Visit | Attending: Otolaryngology

## 2013-07-21 ENCOUNTER — Encounter (HOSPITAL_BASED_OUTPATIENT_CLINIC_OR_DEPARTMENT_OTHER): Payer: Self-pay | Admitting: Certified Registered"

## 2013-07-21 ENCOUNTER — Ambulatory Visit (HOSPITAL_BASED_OUTPATIENT_CLINIC_OR_DEPARTMENT_OTHER)
Admission: RE | Admit: 2013-07-21 | Discharge: 2013-07-21 | Disposition: A | Discharge status: Home / Self Care | Payer: PRIVATE HEALTH INSURANCE | Source: Ambulatory Visit | Attending: Otolaryngology | Admitting: Otolaryngology

## 2013-07-21 ENCOUNTER — Encounter (HOSPITAL_BASED_OUTPATIENT_CLINIC_OR_DEPARTMENT_OTHER): Payer: PRIVATE HEALTH INSURANCE | Admitting: Certified Registered"

## 2013-07-21 ENCOUNTER — Ambulatory Visit (HOSPITAL_BASED_OUTPATIENT_CLINIC_OR_DEPARTMENT_OTHER): Payer: PRIVATE HEALTH INSURANCE | Admitting: Certified Registered"

## 2013-07-21 DIAGNOSIS — M1990 Unspecified osteoarthritis, unspecified site: Secondary | ICD-10-CM | POA: Insufficient documentation

## 2013-07-21 DIAGNOSIS — J329 Chronic sinusitis, unspecified: Secondary | ICD-10-CM | POA: Insufficient documentation

## 2013-07-21 DIAGNOSIS — Z7982 Long term (current) use of aspirin: Secondary | ICD-10-CM | POA: Insufficient documentation

## 2013-07-21 DIAGNOSIS — K219 Gastro-esophageal reflux disease without esophagitis: Secondary | ICD-10-CM | POA: Insufficient documentation

## 2013-07-21 DIAGNOSIS — Z87891 Personal history of nicotine dependence: Secondary | ICD-10-CM | POA: Insufficient documentation

## 2013-07-21 DIAGNOSIS — I69998 Other sequelae following unspecified cerebrovascular disease: Secondary | ICD-10-CM | POA: Insufficient documentation

## 2013-07-21 DIAGNOSIS — R29898 Other symptoms and signs involving the musculoskeletal system: Secondary | ICD-10-CM | POA: Insufficient documentation

## 2013-07-21 DIAGNOSIS — J342 Deviated nasal septum: Secondary | ICD-10-CM | POA: Insufficient documentation

## 2013-07-21 DIAGNOSIS — Z96659 Presence of unspecified artificial knee joint: Secondary | ICD-10-CM | POA: Insufficient documentation

## 2013-07-21 HISTORY — DX: Unspecified osteoarthritis, unspecified site: M19.90

## 2013-07-21 HISTORY — PX: SINUS ENDO W/FUSION: SHX777

## 2013-07-21 HISTORY — DX: Presence of spectacles and contact lenses: Z97.3

## 2013-07-21 HISTORY — PX: SEPTOPLASTY: SHX2393

## 2013-07-21 LAB — POCT HEMOGLOBIN-HEMACUE: Hemoglobin: 14.1 g/dL (ref 12.0–15.0)

## 2013-07-21 SURGERY — SINUS SURGERY, ENDOSCOPIC, USING COMPUTER-ASSISTED NAVIGATION
Anesthesia: General | Site: Nose | Laterality: Bilateral

## 2013-07-21 MED ORDER — OXYCODONE HCL 5 MG/5ML PO SOLN: 5.0000 mg | Freq: Once | ORAL | Status: DC | PRN

## 2013-07-21 MED ORDER — OXYMETAZOLINE HCL 0.05 % NA SOLN
NASAL | Status: DC | PRN
  Administered 2013-07-21: 1 via NASAL

## 2013-07-21 MED ORDER — FENTANYL CITRATE 0.05 MG/ML IJ SOLN: 50.0000 ug | INTRAMUSCULAR | Status: DC | PRN

## 2013-07-21 MED ORDER — MIDAZOLAM HCL 2 MG/2ML IJ SOLN: 1.0000 mg | INTRAMUSCULAR | Status: DC | PRN

## 2013-07-21 MED ORDER — OXYMETAZOLINE HCL 0.05 % NA SOLN
NASAL | Status: AC
  Filled 2013-07-21: qty 15

## 2013-07-21 MED ORDER — MIDAZOLAM HCL 2 MG/2ML IJ SOLN
INTRAMUSCULAR | Status: AC
  Filled 2013-07-21: qty 2

## 2013-07-21 MED ORDER — HYDROCODONE-ACETAMINOPHEN 5-325 MG PO TABS: 1.0000 | ORAL_TABLET | Freq: Four times a day (QID) | ORAL | Status: DC | PRN

## 2013-07-21 MED ORDER — BACITRACIN ZINC 500 UNIT/GM EX OINT
TOPICAL_OINTMENT | CUTANEOUS | Status: AC
  Filled 2013-07-21: qty 28.35

## 2013-07-21 MED ORDER — EPHEDRINE SULFATE 50 MG/ML IJ SOLN
INTRAMUSCULAR | Status: DC | PRN
  Administered 2013-07-21: 10 mg via INTRAVENOUS

## 2013-07-21 MED ORDER — FENTANYL CITRATE 0.05 MG/ML IJ SOLN
INTRAMUSCULAR | Status: AC
  Filled 2013-07-21: qty 6

## 2013-07-21 MED ORDER — SUCCINYLCHOLINE CHLORIDE 20 MG/ML IJ SOLN
INTRAMUSCULAR | Status: DC | PRN
  Administered 2013-07-21: 100 mg via INTRAVENOUS

## 2013-07-21 MED ORDER — CEPHALEXIN 500 MG PO CAPS: 500.0000 mg | ORAL_CAPSULE | Freq: Three times a day (TID) | ORAL | Status: DC

## 2013-07-21 MED ORDER — HYDROMORPHONE HCL PF 1 MG/ML IJ SOLN: 0.2500 mg | INTRAMUSCULAR | Status: DC | PRN

## 2013-07-21 MED ORDER — BACITRACIN ZINC 500 UNIT/GM EX OINT
TOPICAL_OINTMENT | CUTANEOUS | Status: DC | PRN
  Administered 2013-07-21: 1 via TOPICAL

## 2013-07-21 MED ORDER — DEXAMETHASONE SODIUM PHOSPHATE 4 MG/ML IJ SOLN
INTRAMUSCULAR | Status: DC | PRN
  Administered 2013-07-21: 10 mg via INTRAVENOUS

## 2013-07-21 MED ORDER — BACITRACIN-NEOMYCIN-POLYMYXIN 400-5-5000 EX OINT
TOPICAL_OINTMENT | CUTANEOUS | Status: AC
  Filled 2013-07-21: qty 1

## 2013-07-21 MED ORDER — LACTATED RINGERS IV SOLN
INTRAVENOUS | Status: DC
  Administered 2013-07-21 (×2): via INTRAVENOUS

## 2013-07-21 MED ORDER — OXYCODONE HCL 5 MG PO TABS: 5.0000 mg | ORAL_TABLET | Freq: Once | ORAL | Status: DC | PRN

## 2013-07-21 MED ORDER — LIDOCAINE-EPINEPHRINE 1 %-1:100000 IJ SOLN
INTRAMUSCULAR | Status: DC | PRN
  Administered 2013-07-21: 6 mL

## 2013-07-21 MED ORDER — PROPOFOL 10 MG/ML IV BOLUS
INTRAVENOUS | Status: DC | PRN
  Administered 2013-07-21: 130 mg via INTRAVENOUS

## 2013-07-21 MED ORDER — LIDOCAINE-EPINEPHRINE 1 %-1:100000 IJ SOLN
INTRAMUSCULAR | Status: AC
  Filled 2013-07-21: qty 1

## 2013-07-21 MED ORDER — MIDAZOLAM HCL 5 MG/5ML IJ SOLN
INTRAMUSCULAR | Status: DC | PRN
  Administered 2013-07-21: 2 mg via INTRAVENOUS

## 2013-07-21 MED ORDER — LIDOCAINE HCL (CARDIAC) 20 MG/ML IV SOLN
INTRAVENOUS | Status: DC | PRN
  Administered 2013-07-21: 30 mg via INTRAVENOUS

## 2013-07-21 MED ORDER — FENTANYL CITRATE 0.05 MG/ML IJ SOLN
INTRAMUSCULAR | Status: DC | PRN
  Administered 2013-07-21: 50 ug via INTRAVENOUS

## 2013-07-21 SURGICAL SUPPLY — 75 items
ATTRACTOMAT 16X20 MAGNETIC DRP (DRAPES) IMPLANT
BLADE RAD40 ROTATE 4M 4 5PK (BLADE) IMPLANT
BLADE RAD40 ROTATE 4M 4MM 5PK (BLADE)
BLADE RAD60 ROTATE M4 4 5PK (BLADE) IMPLANT
BLADE RAD60 ROTATE M4 4MM 5PK (BLADE)
BLADE ROTATE RAD 12 4 M4 (BLADE) IMPLANT
BLADE ROTATE RAD 12 4MM M4 (BLADE)
BLADE ROTATE RAD 40 4 M4 (BLADE) IMPLANT
BLADE ROTATE RAD 40 4MM M4 (BLADE)
BLADE ROTATE RAD12 5PK M4 4MM (BLADE) IMPLANT
BLADE ROTATE TRICUT 4MX13CM M4 (BLADE) ×1
BLADE ROTATE TRICUT 4X13 M4 (BLADE) ×2 IMPLANT
BLADE TRICUT ROTATE M4 4 5PK (BLADE) IMPLANT
BLADE TRICUT ROTATE M4 4MM 5PK (BLADE)
BUR HS RAD FRONTAL 3 (BURR) IMPLANT
BUR HS RAD FRONTAL 3MM (BURR)
CANISTER SUC SOCK COL 7 IN (MISCELLANEOUS) ×5 IMPLANT
CANISTER SUCT 1200ML W/VALVE (MISCELLANEOUS) ×3 IMPLANT
COAGULATOR SUCT 6 FR SWTCH (ELECTROSURGICAL)
COAGULATOR SUCT SWTCH 10FR 6 (ELECTROSURGICAL) IMPLANT
DECANTER SPIKE VIAL GLASS SM (MISCELLANEOUS) ×1 IMPLANT
DRAPE SURG 17X23 STRL (DRAPES) IMPLANT
DRESSING NASAL KENNEDY 3.5X.9 (MISCELLANEOUS) IMPLANT
DRSG NASAL KENNEDY 3.5X.9 (MISCELLANEOUS)
DRSG NASOPORE 8CM (GAUZE/BANDAGES/DRESSINGS) ×2 IMPLANT
DRSG TELFA 3X8 NADH (GAUZE/BANDAGES/DRESSINGS) IMPLANT
ELECT COATED BLADE 2.86 ST (ELECTRODE) IMPLANT
ELECT REM PT RETURN 9FT ADLT (ELECTROSURGICAL)
ELECTRODE REM PT RTRN 9FT ADLT (ELECTROSURGICAL) ×1 IMPLANT
GLOVE BIOGEL PI IND STRL 7.5 (GLOVE) IMPLANT
GLOVE BIOGEL PI INDICATOR 7.5 (GLOVE) ×2
GLOVE SS BIOGEL STRL SZ 7.5 (GLOVE) ×1 IMPLANT
GLOVE SUPERSENSE BIOGEL SZ 7.5 (GLOVE) ×2
GLOVE SURG SS PI 7.0 STRL IVOR (GLOVE) ×4 IMPLANT
GOWN STRL REUS W/ TWL LRG LVL3 (GOWN DISPOSABLE) ×2 IMPLANT
GOWN STRL REUS W/ TWL XL LVL3 (GOWN DISPOSABLE) ×1 IMPLANT
GOWN STRL REUS W/TWL LRG LVL3 (GOWN DISPOSABLE) ×6
GOWN STRL REUS W/TWL XL LVL3 (GOWN DISPOSABLE) ×3
IV NS 1000ML (IV SOLUTION)
IV NS 1000ML BAXH (IV SOLUTION) IMPLANT
IV NS 500ML (IV SOLUTION) ×3
IV NS 500ML BAXH (IV SOLUTION) ×1 IMPLANT
NDL SPNL 25GX3.5 QUINCKE BL (NEEDLE) IMPLANT
NEEDLE 27GAX1X1/2 (NEEDLE) ×3 IMPLANT
NEEDLE SPNL 25GX3.5 QUINCKE BL (NEEDLE) IMPLANT
NS IRRIG 1000ML POUR BTL (IV SOLUTION) ×3 IMPLANT
PACK BASIN DAY SURGERY FS (CUSTOM PROCEDURE TRAY) ×3 IMPLANT
PACK ENT DAY SURGERY (CUSTOM PROCEDURE TRAY) ×3 IMPLANT
PAD DRESSING TELFA 3X8 NADH (GAUZE/BANDAGES/DRESSINGS) IMPLANT
PAD ENT ADHESIVE 25PK (MISCELLANEOUS) ×3 IMPLANT
PATTIES SURGICAL .5 X3 (DISPOSABLE) ×3 IMPLANT
PENCIL FOOT CONTROL (ELECTRODE) IMPLANT
SLEEVE SCD COMPRESS KNEE MED (MISCELLANEOUS) ×3 IMPLANT
SOLUTION ANTI FOG 6CC (MISCELLANEOUS) ×3 IMPLANT
SPONGE GAUZE 2X2 8PLY STER LF (GAUZE/BANDAGES/DRESSINGS) ×1
SPONGE GAUZE 2X2 8PLY STRL LF (GAUZE/BANDAGES/DRESSINGS) ×2 IMPLANT
SPONGE SURGIFOAM ABS GEL 12-7 (HEMOSTASIS) IMPLANT
SUT CHROMIC 3 0 PS 2 (SUTURE) IMPLANT
SUT CHROMIC 4 0 P 3 18 (SUTURE) ×3 IMPLANT
SUT ETHILON 3 0 PS 1 (SUTURE) ×3 IMPLANT
SUT ETHILON 4 0 CL P 3 (SUTURE) IMPLANT
SUT ETHILON 5 0 PC 1 (SUTURE) IMPLANT
SUT PLAIN 4 0 ~~LOC~~ 1 (SUTURE) ×3 IMPLANT
SUT VIC AB 4-0 P-3 18XBRD (SUTURE) IMPLANT
SUT VIC AB 4-0 P3 18 (SUTURE)
SUT VIC AB 5-0 P-3 18X BRD (SUTURE) IMPLANT
SUT VIC AB 5-0 P3 18 (SUTURE)
TOWEL OR 17X24 6PK STRL BLUE (TOWEL DISPOSABLE) ×3 IMPLANT
TRACKER ENT INSTRUMENT (MISCELLANEOUS) ×3 IMPLANT
TRACKER ENT PATIENT (MISCELLANEOUS) ×3 IMPLANT
TRAY DSU PREP LF (CUSTOM PROCEDURE TRAY) ×3 IMPLANT
TUBE CONNECTING 20'X1/4 (TUBING) ×1
TUBE CONNECTING 20X1/4 (TUBING) ×2 IMPLANT
TUBING STRAIGHTSHOT EPS 5PK (TUBING) ×3 IMPLANT
YANKAUER SUCT BULB TIP NO VENT (SUCTIONS) ×3 IMPLANT

## 2013-07-21 NOTE — H&P (Signed)
Cheryl Reeves is an 43 y.o. female.   Chief Complaint: sinusitis  HPI: hx of sinusitis and now ready to proceed with surgical treatment  Past Medical History  Diagnosis Date  . GERD (gastroesophageal reflux disease)   . Allergy   . Wears glasses   . Stroke 1999    S/p childbirth-weakness rt leg  . DJD (degenerative joint disease)     Past Surgical History  Procedure Laterality Date  . Tubal ligation  2000  . Replacement total knee Right 2009  . Arthroscopy knee w/ drilling  2008    rt    Family History  Problem Relation Age of Onset  . Cancer Mother     lung  . Heart disease Father    Social History:  reports that she quit smoking about 19 months ago. She has never used smokeless tobacco. She reports that she does not drink alcohol or use illicit drugs.  Allergies:  Allergies  Allergen Reactions  . Codeine     headaches    Medications Prior to Admission  Medication Sig Dispense Refill  . aspirin 81 MG tablet Take 81 mg by mouth daily.      . baclofen (LIORESAL) 10 MG tablet TAKE 1 TABLET BY MOUTH 2 TIMES DAILY  60 tablet  2  . buPROPion (WELLBUTRIN XL) 150 MG 24 hr tablet Take 1 tablet (150 mg total) by mouth daily.  30 tablet  11  . carbamazepine (CARBATROL) 200 MG 12 hr capsule Take 1 capsule (200 mg total) by mouth 2 (two) times daily.  60 capsule  11  . levocetirizine (XYZAL) 5 MG tablet Take 5 mg by mouth every evening.      . montelukast (SINGULAIR) 10 MG tablet Take 10 mg by mouth at bedtime.      . naproxen (NAPROSYN) 500 MG tablet TAKE 1 TABLET (500 MG TOTAL) BY MOUTH 2 (TWO) TIMES DAILY WITH A MEAL.  30 tablet  0  . omeprazole (PRILOSEC) 20 MG capsule Take 20 mg by mouth daily.      Marland Kitchen. azelastine (ASTELIN) 137 MCG/SPRAY nasal spray Place 1 spray into the nose 2 (two) times daily. Use in each nostril as directed        Results for orders placed during the hospital encounter of 07/21/13 (from the past 48 hour(s))  POCT HEMOGLOBIN-HEMACUE     Status: None   Collection Time    07/21/13  8:54 AM      Result Value Ref Range   Hemoglobin 14.1  12.0 - 15.0 g/dL   No results found.  Review of Systems  Constitutional: Negative.   HENT: Negative.   Eyes: Negative.   Respiratory: Negative.   Cardiovascular: Negative.   Skin: Negative.     Blood pressure 140/96, pulse 80, temperature 97.9 F (36.6 C), temperature source Oral, height 5\' 6"  (1.676 m), weight 89.359 kg (197 lb), last menstrual period 07/10/2013, SpO2 98.00%. Physical Exam  Constitutional: She appears well-developed and well-nourished.  HENT:  Head: Normocephalic and atraumatic.  Mouth/Throat: Oropharynx is clear and moist.  Eyes: Pupils are equal, round, and reactive to light.  Neck: Normal range of motion. Neck supple.  Cardiovascular: Normal rate.   Respiratory: Effort normal.  GI: Soft.     Assessment/Plan Chronic sinusitis- she has failed medical therapy and ready for surgery. Procedures of septoplasty and ESS discussed.  Suzanna ObeyBYERS, Damon Hargrove 07/21/2013, 9:51 AM

## 2013-07-21 NOTE — Op Note (Signed)
Preop/postop diagnoses: Deviated septum and chronic sinusitis Procedure: Bilateral maxillary antrostomy, bilateral ethmoidectomy anterior, bilateral frontal sinusotomy, and septoplasty. Fusion guidance was used Anesthesia: Gen. Estimated blood loss: Approximately 50 cc Indications: 43 year old with history of significant issues with sinusitis refractory medical therapy. She has nasal obstruction and deviated septum with CT scan findings consistent with chronic sinusitis. She was informed a risk and benefits of the procedure and options were discussed all questions are answered and consent was obtained. Operation: Patient was taken to the operating room placed in the supine position after the fusion computer guidance system was positioned calibrated he was prepped and draped in the usual sterile manner. Oxymetazoline pledgets were placed into the nose bilaterally and the inferior turbinates were injected with along with the septum with 1% lidocaine with 1 100,000 epinephrine. The right side middle turbinate could not be visualized secondary to the deviated septum. A right hemitransfixion incision was performed raised a mucoperichondrial and ostial flap. The cartilage was divided about 2 cm posterior to the caudal strut and the deviated portion of the cartilage was remove the Jansen-Middleton forceps the Therapist, nutritionalreer elevator. The Jansen-Middleton forceps are used to remove the deviated portion of the bone. This corrected the septal deflection. The left side was begun with the sinus surgery with the microdebrider removing the uncinate process from that up to the attachment of middle turbinate. The antrostomy was opened widely with the microdebrider and fusion guidance. The bulla was then opened with the microdebrider and fusion guidance and there was thickened material in the anterior ethmoid up into the nasal frontal duct. Nasal frontal duct was opened. The anterior cells were all opened up with thickening. The pledget  was placed. The right side was repeated in a similar fashion with uncinate removed antrostomy opened widely anterior ethmoid dissected and the nasal frontal duct opened. There was thickened tissue and some polypoid material within the anterior ethmoid region. The nasopharynx was suctioned out of all blood and debris. The hemitransfixion incision closed interrupted 4-0 chromic and a 4 plain gut quilting stitch placed to the septum. Nasopor soaked in Bactroban was placed into the ethmoid bilaterally. There was good hemostasis. Patient was  then awakened brought to recovery in stable condition counts correct

## 2013-07-21 NOTE — Anesthesia Procedure Notes (Signed)
Procedure Name: Intubation Date/Time: 07/21/2013 10:10 AM Performed by: Antwaun Buth Pre-anesthesia Checklist: Patient identified, Emergency Drugs available, Suction available, Patient being monitored and Timeout performed Patient Re-evaluated:Patient Re-evaluated prior to inductionOxygen Delivery Method: Circle system utilized Preoxygenation: Pre-oxygenation with 100% oxygen Intubation Type: IV induction Ventilation: Mask ventilation without difficulty Laryngoscope Size: Mac and 3 Grade View: Grade I Tube type: Oral Tube size: 7.0 mm Number of attempts: 1 Airway Equipment and Method: Stylet and LTA kit utilized Placement Confirmation: ETT inserted through vocal cords under direct vision,  positive ETCO2,  CO2 detector and breath sounds checked- equal and bilateral Secured at: 21 cm Tube secured with: Tape Dental Injury: Teeth and Oropharynx as per pre-operative assessment

## 2013-07-21 NOTE — Discharge Instructions (Signed)

## 2013-07-21 NOTE — Transfer of Care (Signed)
Immediate Anesthesia Transfer of Care Note  Patient: Cheryl Reeves  Procedure(s) Performed: Procedure(s): ENDOSCOPIC SINUS SURGERY WITH FUSION NAVIGATION (Bilateral) SEPTOPLASTY (Bilateral)  Patient Location: PACU  Anesthesia Type:General  Level of Consciousness: awake, alert , oriented and patient cooperative  Airway & Oxygen Therapy: Patient Spontanous Breathing, Patient connected to face mask and aerosol face mask  Post-op Assessment: Report given to PACU RN and Post -op Vital signs reviewed and stable  Post vital signs: Reviewed and stable  Complications: No apparent anesthesia complications

## 2013-07-21 NOTE — Anesthesia Preprocedure Evaluation (Addendum)
Anesthesia Evaluation  Patient identified by MRN, date of birth, ID band Patient awake    Reviewed: Allergy & Precautions, H&P , NPO status , Patient's Chart, lab work & pertinent test results  Airway Mallampati: II TM Distance: >3 FB Neck ROM: Full    Dental no notable dental hx. (+) Teeth Intact, Dental Advisory Given, Chipped   Pulmonary neg pulmonary ROS, former smoker,  breath sounds clear to auscultation  Pulmonary exam normal       Cardiovascular negative cardio ROS  Rhythm:Regular Rate:Normal     Neuro/Psych CVA, Residual Symptoms negative psych ROS   GI/Hepatic negative GI ROS, Neg liver ROS, GERD-  Medicated and Controlled,  Endo/Other  negative endocrine ROS  Renal/GU negative Renal ROS  negative genitourinary   Musculoskeletal   Abdominal   Peds  Hematology negative hematology ROS (+)   Anesthesia Other Findings   Reproductive/Obstetrics negative OB ROS                          Anesthesia Physical Anesthesia Plan  ASA: II  Anesthesia Plan: General   Post-op Pain Management:    Induction: Intravenous  Airway Management Planned: Oral ETT  Additional Equipment:   Intra-op Plan:   Post-operative Plan: Extubation in OR  Informed Consent: I have reviewed the patients History and Physical, chart, labs and discussed the procedure including the risks, benefits and alternatives for the proposed anesthesia with the patient or authorized representative who has indicated his/her understanding and acceptance.   Dental advisory given  Plan Discussed with: CRNA  Anesthesia Plan Comments:         Anesthesia Quick Evaluation

## 2013-07-21 NOTE — Anesthesia Postprocedure Evaluation (Signed)
  Anesthesia Post-op Note  Patient: Cheryl Reeves  Procedure(s) Performed: Procedure(s): ENDOSCOPIC SINUS SURGERY WITH FUSION NAVIGATION (Bilateral) SEPTOPLASTY (Bilateral)  Patient Location: PACU  Anesthesia Type:General  Level of Consciousness: awake, alert , oriented and patient cooperative  Airway and Oxygen Therapy: Patient Spontanous Breathing  Post-op Pain: mild  Post-op Assessment: Post-op Vital signs reviewed, Patient's Cardiovascular Status Stable, Respiratory Function Stable, Patent Airway, No signs of Nausea or vomiting and Pain level controlled  Post-op Vital Signs: Reviewed and stable  Complications: No apparent anesthesia complications

## 2013-07-24 ENCOUNTER — Encounter (HOSPITAL_BASED_OUTPATIENT_CLINIC_OR_DEPARTMENT_OTHER): Payer: Self-pay | Admitting: Otolaryngology

## 2013-08-01 ENCOUNTER — Encounter: Payer: PRIVATE HEALTH INSURANCE | Admitting: Physical Therapy

## 2013-08-03 ENCOUNTER — Encounter: Payer: PRIVATE HEALTH INSURANCE | Admitting: Physical Therapy

## 2013-08-08 ENCOUNTER — Ambulatory Visit: Payer: PRIVATE HEALTH INSURANCE | Attending: Orthopedic Surgery | Admitting: Physical Therapy

## 2013-08-08 DIAGNOSIS — IMO0001 Reserved for inherently not codable concepts without codable children: Secondary | ICD-10-CM | POA: Insufficient documentation

## 2013-08-08 DIAGNOSIS — M25676 Stiffness of unspecified foot, not elsewhere classified: Secondary | ICD-10-CM | POA: Insufficient documentation

## 2013-08-08 DIAGNOSIS — M25579 Pain in unspecified ankle and joints of unspecified foot: Secondary | ICD-10-CM | POA: Insufficient documentation

## 2013-08-08 DIAGNOSIS — M25673 Stiffness of unspecified ankle, not elsewhere classified: Secondary | ICD-10-CM | POA: Insufficient documentation

## 2013-08-08 DIAGNOSIS — Z96659 Presence of unspecified artificial knee joint: Secondary | ICD-10-CM | POA: Insufficient documentation

## 2013-08-08 DIAGNOSIS — R5381 Other malaise: Secondary | ICD-10-CM | POA: Insufficient documentation

## 2013-08-08 DIAGNOSIS — I69959 Hemiplegia and hemiparesis following unspecified cerebrovascular disease affecting unspecified side: Secondary | ICD-10-CM | POA: Insufficient documentation

## 2013-09-24 ENCOUNTER — Other Ambulatory Visit: Payer: Self-pay | Admitting: Family Medicine

## 2013-09-26 ENCOUNTER — Telehealth: Payer: Self-pay | Admitting: Nurse Practitioner

## 2013-09-26 NOTE — Telephone Encounter (Signed)
appt given for 5/4 with Cheryl Reeves

## 2013-10-09 ENCOUNTER — Encounter: Payer: Self-pay | Admitting: Nurse Practitioner

## 2013-10-09 ENCOUNTER — Ambulatory Visit (INDEPENDENT_AMBULATORY_CARE_PROVIDER_SITE_OTHER): Payer: PRIVATE HEALTH INSURANCE | Admitting: Nurse Practitioner

## 2013-10-09 VITALS — BP 143/83 | HR 83 | Temp 97.8°F | Ht 66.0 in | Wt 194.0 lb

## 2013-10-09 DIAGNOSIS — K219 Gastro-esophageal reflux disease without esophagitis: Secondary | ICD-10-CM

## 2013-10-09 DIAGNOSIS — J309 Allergic rhinitis, unspecified: Secondary | ICD-10-CM

## 2013-10-09 DIAGNOSIS — F329 Major depressive disorder, single episode, unspecified: Secondary | ICD-10-CM

## 2013-10-09 DIAGNOSIS — R569 Unspecified convulsions: Secondary | ICD-10-CM

## 2013-10-09 DIAGNOSIS — F32A Depression, unspecified: Secondary | ICD-10-CM

## 2013-10-09 DIAGNOSIS — G40909 Epilepsy, unspecified, not intractable, without status epilepticus: Secondary | ICD-10-CM | POA: Insufficient documentation

## 2013-10-09 DIAGNOSIS — M21371 Foot drop, right foot: Secondary | ICD-10-CM

## 2013-10-09 DIAGNOSIS — L719 Rosacea, unspecified: Secondary | ICD-10-CM

## 2013-10-09 DIAGNOSIS — F3289 Other specified depressive episodes: Secondary | ICD-10-CM

## 2013-10-09 DIAGNOSIS — Z8673 Personal history of transient ischemic attack (TIA), and cerebral infarction without residual deficits: Secondary | ICD-10-CM

## 2013-10-09 DIAGNOSIS — M216X9 Other acquired deformities of unspecified foot: Secondary | ICD-10-CM

## 2013-10-09 DIAGNOSIS — I6930 Unspecified sequelae of cerebral infarction: Secondary | ICD-10-CM | POA: Insufficient documentation

## 2013-10-09 MED ORDER — CARBAMAZEPINE ER 200 MG PO CP12: 200.0000 mg | ORAL_CAPSULE | Freq: Two times a day (BID) | ORAL | Status: DC

## 2013-10-09 MED ORDER — LEVOCETIRIZINE DIHYDROCHLORIDE 5 MG PO TABS: 5.0000 mg | ORAL_TABLET | Freq: Every evening | ORAL | Status: DC

## 2013-10-09 MED ORDER — MONTELUKAST SODIUM 10 MG PO TABS: 10.0000 mg | ORAL_TABLET | Freq: Every day | ORAL | Status: DC

## 2013-10-09 MED ORDER — OMEPRAZOLE 20 MG PO CPDR: 20.0000 mg | DELAYED_RELEASE_CAPSULE | Freq: Every day | ORAL | Status: DC

## 2013-10-09 MED ORDER — METRONIDAZOLE 1 % EX GEL: Freq: Every day | CUTANEOUS | Status: DC

## 2013-10-09 MED ORDER — BUPROPION HCL ER (XL) 150 MG PO TB24: 150.0000 mg | ORAL_TABLET | Freq: Every day | ORAL | Status: DC

## 2013-10-09 MED ORDER — AZELASTINE HCL 0.1 % NA SOLN: 1.0000 | Freq: Two times a day (BID) | NASAL | Status: DC

## 2013-10-09 NOTE — Progress Notes (Signed)
Subjective:    Patient ID: Cheryl Reeves, female    DOB: Sep 13, 1970, 43 y.o.   MRN: [id number]  [facility]- No complaints today:  Patient Active Problem List   Diagnosis Date Noted  . Allergic rhinitis 10/09/2013  . GERD (gastroesophageal reflux disease) 10/09/2013  . H/O: CVA (cerebrovascular accident) 10/09/2013  . Foot drop, right 10/09/2013  . Rosacea, acne 10/09/2013  . Seizures 10/09/2013    Outpatient Encounter Prescriptions as of 10/09/2013  Medication Sig  . aspirin 81 MG tablet Take 81 mg by mouth daily.  Marland Kitchen azelastine (ASTELIN) 137 MCG/SPRAY nasal spray Place 1 spray into the nose 2 (two) times daily. Use in each nostril as directed  . baclofen (LIORESAL) 10 MG tablet TAKE 1 TABLET BY MOUTH 2 TIMES DAILY  . buPROPion (WELLBUTRIN XL) 150 MG 24 hr tablet Take 1 tablet (150 mg total) by mouth daily.  . carbamazepine (CARBATROL) 200 MG 12 hr capsule Take 1 capsule (200 mg total) by mouth 2 (two) times daily.  Marland Kitchen levocetirizine (XYZAL) 5 MG tablet Take 5 mg by mouth every evening.  . montelukast (SINGULAIR) 10 MG tablet Take 10 mg by mouth at bedtime.  Marland Kitchen omeprazole (PRILOSEC) 20 MG capsule Take 20 mg by mouth daily.  . [DISCONTINUED] HYDROcodone-acetaminophen (LORTAB) 5-325 MG per tablet Take 1 tablet by mouth every 6 (six) hours as needed for moderate pain.  . [DISCONTINUED] cephALEXin (KEFLEX) 500 MG capsule Take 1 capsule (500 mg total) by mouth 3 (three) times daily.  . [DISCONTINUED] naproxen (NAPROSYN) 500 MG tablet TAKE 1 TABLET (500 MG TOTAL) BY MOUTH 2 (TWO) TIMES DAILY WITH A MEAL.       Review of Systems  Constitutional: Negative.   HENT: Negative.   Respiratory: Negative.   Cardiovascular: Negative.   Genitourinary: Negative.   Neurological: Negative.   Psychiatric/Behavioral: Negative.   All other systems reviewed and are negative.      Objective:   Physical Exam  Constitutional: She is  oriented to person, place, and time. She appears well-developed and well-nourished.  HENT:  Nose: Nose normal.  Mouth/Throat: Oropharynx is clear and moist.  Eyes: EOM are normal.  Neck: Trachea normal, normal range of motion and full passive range of motion without pain. Neck supple. No JVD present. Carotid bruit is not present. No thyromegaly present.  Cardiovascular: Normal rate, regular rhythm, normal heart sounds and intact distal pulses.  Exam reveals no gallop and no friction rub.   No murmur heard. Pulmonary/Chest: Effort normal and breath sounds normal.  Abdominal: Soft. Bowel sounds are normal. She exhibits no distension and no mass. There is no tenderness.  Musculoskeletal: Normal range of motion.  Foot drop on right  Lymphadenopathy:    She has no cervical adenopathy.  Neurological: She is alert and oriented to person, place, and time. She has normal reflexes.  Skin: Skin is warm and dry.  Psychiatric: She has a normal mood and affect. Her behavior is normal. Judgment and thought content normal.    BP 143/83  Pulse 83  Temp(Src) 97.8 F (36.6 C) (Oral)  Ht 5' 6"  (1.676 m)  Wt 194 lb (87.998 kg)  BMI 31.33 kg/m2       Assessment & Plan:   1. Allergic rhinitis   2. GERD (gastroesophageal reflux disease)   3. H/O: CVA (cerebrovascular accident)   4. Foot drop, right   5. Rosacea, acne   6. Depression  7. Seizures    Orders Placed This Encounter  Procedures  . CMP14+EGFR  . NMR, lipoprofile  . Carbamazepine level, free   Meds ordered this encounter  Medications  . metroNIDAZOLE (METROGEL) 1 % gel    Sig: Apply topically daily.    Dispense:  45 g    Refill:  0    Order Specific Question:  Supervising Provider    Answer:  Chipper Herb [[id number]]  . buPROPion (WELLBUTRIN XL) 150 MG 24 hr tablet    Sig: Take 1 tablet (150 mg total) by mouth daily.    Dispense:  30 tablet    Refill:  11    Order Specific Question:  Supervising Provider    Answer:  Chipper Herb [[id number]]  . carbamazepine (CARBATROL) 200 MG 12 hr capsule    Sig: Take 1 capsule (200 mg total) by mouth 2 (two) times daily.    Dispense:  60 capsule    Refill:  11    Order Specific Question:  Supervising Provider    Answer:  Chipper Herb [[id number]]  . azelastine (ASTELIN) 0.1 % nasal spray    Sig: Place 1 spray into both nostrils 2 (two) times daily. Use in each nostril as directed    Dispense:  30 mL    Refill:  5    Order Specific Question:  Supervising Provider    Answer:  Chipper Herb [[id number]]  . levocetirizine (XYZAL) 5 MG tablet    Sig: Take 1 tablet (5 mg total) by mouth every evening.    Dispense:  30 tablet    Refill:  5    Order Specific Question:  Supervising Provider    Answer:  Chipper Herb [[id number]]  . montelukast (SINGULAIR) 10 MG tablet    Sig: Take 1 tablet (10 mg total) by mouth at bedtime.    Dispense:  30 tablet    Refill:  5    Order Specific Question:  Supervising Provider    Answer:  Chipper Herb [[id number]]  . omeprazole (PRILOSEC) 20 MG capsule    Sig: Take 1 capsule (20 mg total) by mouth daily.    Dispense:  30 capsule    Refill:  5    Order Specific Question:  Supervising Provider    Answer:  Joycelyn Man   Added metro gel for rosacea Labs pending Health maintenance reviewed Diet and exercise encouraged Continue all meds Follow up  In 6 months   [place], FNP

## 2013-10-09 NOTE — Patient Instructions (Signed)
Rosacea Rosacea is a long-term (chronic) condition that affects the skin of the face (cheeks, nose, brow, and chin) and sometimes the eyes. Rosacea causes the blood vessels near the surface of the skin to enlarge, resulting in redness. This condition usually begins after age 43. It occurs most often in light-skinned women. Without treatment, rosacea tends to get worse over time. There is no cure for rosacea, but treatment can help control your symptoms. CAUSES  The cause is unknown. It is thought that some people may inherit a tendency to develop rosacea. Certain triggers can make your rosacea worse, including:  Hot baths.  Exercise.  Sunlight.  Very hot or cold temperatures.  Hot or spicy foods and drinks.  Drinking alcohol.  Stress.  Taking blood pressure medicine.  Long-term use of topical steroids on the face. SYMPTOMS   Redness of the face.  Red bumps or pimples on the face.  Red, enlarged nose (rhinophyma).  Blushing easily.  Red lines on the skin.  Irritated or burning feeling in the eyes.  Swollen eyelids. DIAGNOSIS  Your caregiver can usually tell what is wrong by asking about your symptoms and performing a physical exam. TREATMENT  Avoiding triggers is an important part of treatment. You will also need to see a skin specialist (dermatologist) who can develop a treatment plan for you. The goals of treatment are to control your condition and to improve the appearance of your skin. It may take several weeks or months of treatment before you notice an improvement in your skin. Even after your skin improves, you will likely need to continue treatment to prevent your rosacea from coming back. Treatment methods may include:  Using sunscreen or sunblock daily to protect the skin.  Antibiotic medicine, such as metronidazole, applied directly to the skin.  Antibiotics taken by mouth. This is usually prescribed if you have eye problems from your rosacea.  Laser surgery  to improve the appearance of the skin. This surgery can reduce the appearance of red lines on the skin and can remove excess tissue from the nose to reduce its size. HOME CARE INSTRUCTIONS  Avoid things that seem to trigger your flare-ups.  If you are given antibiotics, take them as directed. Finish them even if you start to feel better.  Use a gentle facial cleanser that does not contain alcohol.  You may use a mild facial moisturizer.  Use a sunscreen or sunblock with SPF 30 or greater.  Wear a green-tinted foundation powder to conceal redness, if needed. Choose cosmetics that are noncomedogenic. This means they do not block your pores.  If your eyelids are affected, apply warm compresses to the eyelids. Do this up to 4 times a day or as directed by your caregiver. SEEK MEDICAL CARE IF:  Your skin problems get worse.  You feel depressed.  You lose your appetite.  You have trouble concentrating.  You have problems with your eyes, such as redness or itching. MAKE SURE YOU:  Understand these instructions.  Will watch your condition.  Will get help right away if you are not doing well or get worse. Document Released: 07/02/2004 Document Revised: 11/24/2011 Document Reviewed: 05/05/2011 Exit[employer] Patient Information 2014 [employer].  

## 2013-10-10 LAB — CMP14+EGFR
ALT: 11 IU/L (ref 0–32)
AST: 18 IU/L (ref 0–40)
Albumin/Globulin Ratio: 2 (ref 1.1–2.5)
Albumin: 4.6 g/dL (ref 3.5–5.5)
Alkaline Phosphatase: 90 IU/L (ref 39–117)
BUN / CREAT RATIO: 10 (ref 9–23)
BUN: 7 mg/dL (ref 6–24)
CHLORIDE: 96 mmol/L — AB (ref 97–108)
CO2: 23 mmol/L (ref 18–29)
CREATININE: 0.67 mg/dL (ref 0.57–1.00)
Calcium: 9.5 mg/dL (ref 8.7–10.2)
GFR calc Af Amer: 125 mL/min/{1.73_m2} (ref >59)
GFR calc non Af Amer: 108 mL/min/{1.73_m2} (ref >59)
GLOBULIN, TOTAL: 2.3 g/dL (ref 1.5–4.5)
Glucose: 96 mg/dL (ref 65–99)
Potassium: 4.4 mmol/L (ref 3.5–5.2)
Sodium: 134 mmol/L (ref 134–144)
Total Bilirubin: 0.2 mg/dL (ref 0.0–1.2)
Total Protein: 6.9 g/dL (ref 6.0–8.5)

## 2013-10-10 LAB — NMR, LIPOPROFILE
Cholesterol: 174 mg/dL (ref <200)
HDL Cholesterol by NMR: 41 mg/dL (ref >=40)
HDL Particle Number: 27.4 umol/L — ABNORMAL LOW (ref >=30.5)
LDL Particle Number: 1534 nmol/L — ABNORMAL HIGH (ref <1000)
LDL Size: 20.6 nm (ref >20.5)
LDLC SERPL CALC-MCNC: 108 mg/dL — AB (ref <100)
LP-IR Score: 41 (ref <=45)
Small LDL Particle Number: 849 nmol/L — ABNORMAL HIGH (ref <=527)
Triglycerides by NMR: 123 mg/dL (ref <150)

## 2013-10-10 LAB — CARBAMAZEPINE, FREE AND TOTAL: Carbamazepine, Free: 1.8 ug/mL (ref 0.6–4.2)

## 2013-10-16 ENCOUNTER — Encounter: Payer: Self-pay | Admitting: *Deleted

## 2013-10-17 ENCOUNTER — Encounter: Payer: Self-pay | Admitting: Neurology

## 2013-10-17 ENCOUNTER — Ambulatory Visit (INDEPENDENT_AMBULATORY_CARE_PROVIDER_SITE_OTHER): Payer: PRIVATE HEALTH INSURANCE | Admitting: Neurology

## 2013-10-17 VITALS — BP 135/84 | HR 77 | Ht 66.0 in | Wt 195.0 lb

## 2013-10-17 DIAGNOSIS — R51 Headache: Secondary | ICD-10-CM

## 2013-10-17 DIAGNOSIS — G8110 Spastic hemiplegia affecting unspecified side: Secondary | ICD-10-CM

## 2013-10-17 MED ORDER — TOPIRAMATE 25 MG PO TABS: 25.0000 mg | ORAL_TABLET | Freq: Two times a day (BID) | ORAL | Status: DC

## 2013-10-17 NOTE — Progress Notes (Signed)
[facility]    Provider:  Dr Hosie PoissonSumner Referring Provider: Ernestina PennaMoore, Donald W, MD Primary Care Physician:  Rudi HeapMOORE, DONALD, MD  CC:  headaches  HPI:  Cheyne T Sw[place] is a 43 y.o. female here as a referral from Cheryl. Christell Reeves for headache evaluation. Notes headaches started years ago, she has been working with ENT and allergist, had repair of a deviated septum and cleaned out her sinuses. Unfortunately her headaches have continued. Typically occuring 2-3 times a week, bifrontal and behind her nose and eyes, gets some sensitivity of her teeth. Described as a pounding pressure type sensation. Gets up to 7/10 at its worst, can last all day if severe. Has some nausea, no photo or phonophobia. No focal motor or sensory changes. No known triggers. Notes sleeping well but notes she is up frequently. Notes she snores, unclear if any apnea events. Wakes up feeling fatigued and remains fatigued throughout the day.   Had stroke at age 43, told it was related to post partum hemorrhage. Since stroke has had right sided weakness, causing difficulty with walking due to inversion of her right ankle, causing break down of her arch. Developed seizures after this event, started on tegretol, last seizure in 2001. Overall healthy, has chronic sinus congestion.  Review of Systems: Out of a complete 14 system review, the patient complains of only the following symptoms, and all other reviewed systems are negative. + headache, weakness, slurred speech, seizures, anxiety  History   Social History  . Marital Status: Single    Spouse Name: N/A    Number of Children: N/A  . Years of Education: N/A   Occupational History  . Not on file.   Social History Main Topics  . Smoking status: Former Smoker    Quit date: 12/06/2011  . Smokeless tobacco: Never Used  . Alcohol Use: No  . Drug Use: No  . Sexual Activity: Not on file   Other Topics Concern  . Not on file   Social History Narrative   Single, 1  child   Right handed   10 th grade   1/2-1 cup daily    Family History  Problem Relation Age of Onset  . Cancer Mother     lung  . Heart disease Father   . Diabetes    . Arthritis/Rheumatoid      Past Medical History  Diagnosis Date  . GERD (gastroesophageal reflux disease)   . Allergy   . Wears glasses   . Stroke 1999    S/p childbirth-weakness rt leg  . DJD (degenerative joint disease)     Past Surgical History  Procedure Laterality Date  . Tubal ligation  2000  . Replacement total knee Right 2009  . Arthroscopy knee w/ drilling  2008    rt  . Sinus endo w/fusion Bilateral 07/21/2013    Procedure: ENDOSCOPIC SINUS SURGERY WITH FUSION NAVIGATION;  Surgeon: Suzanna ObeyJohn Byers, MD;  Location: [facility];  Service: ENT;  Laterality: Bilateral;  . Septoplasty Bilateral 07/21/2013    Procedure: SEPTOPLASTY;  Surgeon: Suzanna ObeyJohn Byers, MD;  Location: [facility];  Service: ENT;  Laterality: Bilateral;    Current Outpatient Prescriptions  Medication Sig Dispense Refill  . aspirin 81 MG tablet Take 81 mg by mouth daily.      Marland Kitchen. azelastine (ASTELIN) 0.1 % nasal spray Place 1 spray into both nostrils 2 (two) times daily. Use in each nostril as directed  30 mL  5  . baclofen (LIORESAL) 10  MG tablet TAKE 1 TABLET BY MOUTH 2 TIMES DAILY  60 tablet  2  . buPROPion (WELLBUTRIN XL) 150 MG 24 hr tablet Take 1 tablet (150 mg total) by mouth daily.  30 tablet  11  . carbamazepine (CARBATROL) 200 MG 12 hr capsule Take 1 capsule (200 mg total) by mouth 2 (two) times daily.  60 capsule  11  . levocetirizine (XYZAL) 5 MG tablet Take 1 tablet (5 mg total) by mouth every evening.  30 tablet  5  . metroNIDAZOLE (METROGEL) 1 % gel Apply topically daily.  45 g  0  . montelukast (SINGULAIR) 10 MG tablet Take 1 tablet (10 mg total) by mouth at bedtime.  30 tablet  5  . omeprazole (PRILOSEC) 20 MG capsule Take 1 capsule (20 mg total) by mouth daily.  30 capsule  5   No current  facility-administered medications for this visit.    Allergies as of 10/17/2013 - Review Complete 10/17/2013  Allergen Reaction Noted  . Codeine  12/05/2012    Vitals: BP 135/84  Pulse 77  Ht 5\' 6"  (1.676 m)  Wt 195 lb (88.451 kg)  BMI 31.49 kg/m2 Last Weight:  Wt Readings from Last 1 Encounters:  10/17/13 195 lb (88.451 kg)   Last Height:   Ht Readings from Last 1 Encounters:  10/17/13 5\' 6"  (1.676 m)     Physical exam: Exam: Gen: NAD, conversant Eyes: anicteric sclerae, moist conjunctivae HENT: Atraumatic, oropharynx clear Neck: Trachea midline; supple,  Lungs: CTA, no wheezing, rales, rhonic                          CV: RRR, no MRG Abdomen: Soft, non-tender;  Extremities: No peripheral edema  Skin: Normal temperature, no rash,  Psych: Appropriate affect, pleasant  Neuro: MS: AA&Ox3, appropriately interactive, normal affect   Speech: fluent w/o paraphasic error  Memory: good recent and remote recall  CN: PERRL, VFF to FC bilat, unable to fully visualize fundus due to pupil size, EOMI no nystagmus, no ptosis, sensation intact to LT V1-V3 bilat, face symmetric, no weakness, hearing grossly intact, palate elevates symmetrically, shoulder shrug 5/5 bilat,  tongue protrudes midline, no fasiculations noted.  Motor: normal bulk and tone Strength: RUE proximal 5-/5, distal 5/5, RLE 4+/5 proximal, distal foot drop with inversion of right ankle noted  Coord: rapid alternating and point-to-point (FNF, HTS) movements intact.  Reflexes: brisk right sided reflexes, clonus noted at patella on right  Sens: LT intact in all extremities   Assessment:  After physical and neurologic examination, review of laboratory studies, imaging, neurophysiology testing and pre-existing records, assessment will be reviewed on the problem list.  Plan:  Treatment plan and additional workup will be reviewed under Problem List.  1)Headache 2)spastic hemiplegia  43y/o woman presenting  for initial evaluation of chronic headaches and post stroke spasticity. Unclear etiology of headaches, appear to be more tension type in nature, notes they are occuring frequently and are negatively impacting her quality of life. Therefore will start patient on Topamax 25mg  twice a day. She will follow up with her eye doctor to fully visualize optic discs and also see if eye strain could be contributing to her symptoms. Will place request for botox therapy for post stroke spasticity. Will follow up once approval granted.   Elspeth ChoPeter Hindy Perrault, DO  [facility] [address] [place], [place] [id number]  Phone [phone] Fax [phone]

## 2013-10-17 NOTE — Patient Instructions (Signed)
Overall you are doing fairly well but I do want to suggest a few things today:   Remember to drink plenty of fluid, eat healthy meals and do not skip any meals. Try to eat protein with a every meal and eat a healthy snack such as fruit or nuts in between meals. Try to keep a regular sleep-wake schedule and try to exercise daily, particularly in the form of walking, 20-30 minutes a day, if you can.   As far as your medications are concerned, I would like to suggest the following: 1)Please start taking Topamax 25mg  nightly for 2 weeks and then increase to 25mg  twice a day  Please follow up with your eye doctor for a routine eye exam  I think you will benefit from Botox injections to treat your lower extremity spasticity. We will place a referral with your insurance company and will call you to schedule  Please call us with any interim questions, concerns, problems, updates or refill requests.   My clinical assistant and will answer any of your questions and relay your messages to me and also relay most of my messages to you.   Our phone number is [phone]. We also have an after hours call service for urgent matters and there is a physician on-call for urgent questions. For any emergencies you know to call 911 or go to the nearest emergency room

## 2013-10-27 ENCOUNTER — Other Ambulatory Visit: Payer: Self-pay | Admitting: Family Medicine

## 2013-12-21 ENCOUNTER — Ambulatory Visit (INDEPENDENT_AMBULATORY_CARE_PROVIDER_SITE_OTHER): Payer: PRIVATE HEALTH INSURANCE | Admitting: Family Medicine

## 2013-12-21 VITALS — BP 149/87 | HR 78 | Temp 98.3°F | Ht 66.0 in | Wt 182.6 lb

## 2013-12-21 DIAGNOSIS — J320 Chronic maxillary sinusitis: Secondary | ICD-10-CM

## 2013-12-21 MED ORDER — FLUCONAZOLE 150 MG PO TABS: 150.0000 mg | ORAL_TABLET | Freq: Once | ORAL | Status: DC

## 2013-12-21 MED ORDER — LEVOFLOXACIN 500 MG PO TABS: 500.0000 mg | ORAL_TABLET | Freq: Every day | ORAL | Status: DC

## 2013-12-21 MED ORDER — METHYLPREDNISOLONE 4 MG PO KIT: PACK | ORAL | Status: DC

## 2013-12-21 NOTE — Progress Notes (Signed)
   Subjective:    Patient ID: Cheryl Reeves, female    DOB: 07-11-1970, 43 y.o.   MRN: [id number]  HPI This 43 y.o. female presents for evaluation of sinus pressure and URI sx's.  She has Hx of chronic sinusitis.   Review of Systems    No chest pain, SOB, HA, dizziness, vision change, N/V, diarrhea, constipation, dysuria, urinary urgency or frequency, myalgias, arthralgias or rash.  Objective:   Physical Exam  Vital signs noted  Well developed well nourished female.  HEENT - Head atraumatic Normocephalic                Eyes - PERRLA, Conjuctiva - clear Sclera- Clear EOMI                Ears - EAC's Wnl TM's Wnl Gross Hearing WNL                 Throat - oropharanx wnl Respiratory - Lungs CTA bilateral Cardiac - RRR S1 and S2 without murmur GI - Abdomen soft Nontender and bowel sounds active x 4       Assessment & Plan:  Chronic maxillary sinusitis - Plan: fluconazole (DIFLUCAN) 150 MG tablet, levofloxacin (LEVAQUIN) 500 MG tablet, methylPREDNISolone (MEDROL DOSEPAK) 4 MG tablet  Push po fluids, rest, tylenol and motrin otc prn as directed for fever, arthralgias, and myalgias.  Follow up prn if sx's continue or persist.  Deatra CanterWilliam J Oxford FNP

## 2013-12-27 ENCOUNTER — Other Ambulatory Visit: Payer: Self-pay | Admitting: Nurse Practitioner

## 2014-01-23 ENCOUNTER — Telehealth: Payer: Self-pay | Admitting: Family Medicine

## 2014-01-23 NOTE — Telephone Encounter (Signed)
Pt notified will need to be seen Verbalizes understanding

## 2014-01-23 NOTE — Telephone Encounter (Signed)
Pt needs to be seen in office as Cheryl Reeves StableBill is out of town this week

## 2014-01-23 NOTE — Telephone Encounter (Signed)
Saw Cheryl Reeves on 7/16 Pt having continued sxs of sinus pressure, nasal drainage, sinus pressure Wants refill on Levaquin Please advise

## 2014-05-10 ENCOUNTER — Other Ambulatory Visit: Payer: Self-pay | Admitting: Nurse Practitioner

## 2014-06-10 ENCOUNTER — Other Ambulatory Visit: Payer: Self-pay | Admitting: Nurse Practitioner

## 2014-07-07 ENCOUNTER — Other Ambulatory Visit: Payer: Self-pay | Admitting: Nurse Practitioner

## 2014-07-10 ENCOUNTER — Other Ambulatory Visit: Payer: Self-pay | Admitting: Nurse Practitioner

## 2014-07-13 ENCOUNTER — Ambulatory Visit (INDEPENDENT_AMBULATORY_CARE_PROVIDER_SITE_OTHER): Payer: Medicare Other | Admitting: Nurse Practitioner

## 2014-07-13 ENCOUNTER — Encounter: Payer: Self-pay | Admitting: Nurse Practitioner

## 2014-07-13 VITALS — BP 132/86 | HR 70 | Temp 97.1°F | Ht 66.0 in | Wt 182.0 lb

## 2014-07-13 DIAGNOSIS — J069 Acute upper respiratory infection, unspecified: Secondary | ICD-10-CM | POA: Diagnosis not present

## 2014-07-13 DIAGNOSIS — Z8673 Personal history of transient ischemic attack (TIA), and cerebral infarction without residual deficits: Secondary | ICD-10-CM | POA: Diagnosis not present

## 2014-07-13 DIAGNOSIS — L719 Rosacea, unspecified: Secondary | ICD-10-CM

## 2014-07-13 DIAGNOSIS — J309 Allergic rhinitis, unspecified: Secondary | ICD-10-CM | POA: Diagnosis not present

## 2014-07-13 DIAGNOSIS — F329 Major depressive disorder, single episode, unspecified: Secondary | ICD-10-CM

## 2014-07-13 DIAGNOSIS — F32A Depression, unspecified: Secondary | ICD-10-CM

## 2014-07-13 DIAGNOSIS — M21371 Foot drop, right foot: Secondary | ICD-10-CM | POA: Diagnosis not present

## 2014-07-13 DIAGNOSIS — K219 Gastro-esophageal reflux disease without esophagitis: Secondary | ICD-10-CM

## 2014-07-13 DIAGNOSIS — R569 Unspecified convulsions: Secondary | ICD-10-CM

## 2014-07-13 MED ORDER — BUPROPION HCL ER (XL) 150 MG PO TB24: 150.0000 mg | ORAL_TABLET | Freq: Every day | ORAL | Status: DC

## 2014-07-13 MED ORDER — MONTELUKAST SODIUM 10 MG PO TABS: 10.0000 mg | ORAL_TABLET | Freq: Every day | ORAL | Status: DC

## 2014-07-13 MED ORDER — CARBAMAZEPINE ER 200 MG PO CP12: 200.0000 mg | ORAL_CAPSULE | Freq: Two times a day (BID) | ORAL | Status: DC

## 2014-07-13 MED ORDER — BACLOFEN 10 MG PO TABS: 10.0000 mg | ORAL_TABLET | Freq: Two times a day (BID) | ORAL | Status: DC

## 2014-07-13 MED ORDER — OMEPRAZOLE 20 MG PO CPDR: DELAYED_RELEASE_CAPSULE | ORAL | Status: DC

## 2014-07-13 MED ORDER — AZELASTINE HCL 0.1 % NA SOLN: 1.0000 | Freq: Two times a day (BID) | NASAL | Status: DC

## 2014-07-13 MED ORDER — LEVOCETIRIZINE DIHYDROCHLORIDE 5 MG PO TABS: ORAL_TABLET | ORAL | Status: DC

## 2014-07-13 MED ORDER — AZITHROMYCIN 250 MG PO TABS: ORAL_TABLET | ORAL | Status: DC

## 2014-07-13 MED ORDER — METHYLPREDNISOLONE ACETATE 80 MG/ML IJ SUSP
80.0000 mg | Freq: Once | INTRAMUSCULAR | Status: AC
  Administered 2014-07-13: 80 mg via INTRAMUSCULAR

## 2014-07-13 NOTE — Patient Instructions (Addendum)

## 2014-07-13 NOTE — Progress Notes (Signed)
Subjective:    Patient ID: Cheryl Reeves, female    DOB: 05/04/1971, 44 y.o.   MRN: [id number]  HPI Patient is here today for chronic disease follow up. She is complaining of sinus pressure that started two weeks ago. She reports postnasal drip, rhinorrhea, sore throat. She denies any coughing. She has tried OTC sudafed, alcacelcer, muccinex medication without any relief.   Patient Active Problem List   Diagnosis Date Noted  . Allergic rhinitis 10/09/2013  . GERD (gastroesophageal reflux disease) 10/09/2013  . H/O: CVA (cerebrovascular accident) 10/09/2013  . Foot drop, right 10/09/2013  . Rosacea, acne 10/09/2013  . Seizures 10/09/2013   Current Outpatient Prescriptions on File Prior to Visit  Medication Sig Dispense Refill  . aspirin 81 MG tablet Take 81 mg by mouth daily.    Marland Kitchen azelastine (ASTELIN) 0.1 % nasal spray Place 1 spray into both nostrils 2 (two) times daily. Use in each nostril as directed 30 mL 5  . baclofen (LIORESAL) 10 MG tablet TAKE 1 TABLET BY MOUTH 2 TIMES DAILY 60 tablet 2  . buPROPion (WELLBUTRIN XL) 150 MG 24 hr tablet Take 1 tablet (150 mg total) by mouth daily. 30 tablet 11  . carbamazepine (CARBATROL) 200 MG 12 hr capsule Take 1 capsule (200 mg total) by mouth 2 (two) times daily. 60 capsule 11  . levocetirizine (XYZAL) 5 MG tablet TAKE 1 TABLET (5 MG TOTAL) BY MOUTH EVERY EVENING. 30 tablet 4  . metroNIDAZOLE (METROGEL) 1 % gel Apply topically daily. 45 g 0  . montelukast (SINGULAIR) 10 MG tablet Take 1 tablet (10 mg total) by mouth at bedtime. 30 tablet 5  . omeprazole (PRILOSEC) 20 MG capsule TAKE 1 CAPSULE (20 MG TOTAL) BY MOUTH DAILY. 30 capsule 0  . topiramate (TOPAMAX) 25 MG tablet Take 1 tablet (25 mg total) by mouth 2 (two) times daily. 60 tablet 6   No current facility-administered medications on file prior to visit.     Review of Systems  HENT: Positive for postnasal drip, rhinorrhea, sinus pressure and sore throat. Negative for sneezing.     Eyes: Negative.   Cardiovascular: Negative.   Gastrointestinal: Negative.   Genitourinary: Negative.   Musculoskeletal: Negative.   Allergic/Immunologic: Negative.   Neurological: Negative.   Hematological: Negative.        Objective:   Physical Exam  Constitutional: She is oriented to person, place, and time. She appears well-developed and well-nourished.  HENT:  Head: Normocephalic and atraumatic.  Eyes: Pupils are equal, round, and reactive to light.  Neck: Normal range of motion.  Cardiovascular: Normal rate and regular rhythm.   Pulmonary/Chest: Effort normal and breath sounds normal.  Abdominal: Soft.  Musculoskeletal:  Ambulate with a cane.   Neurological: She is alert and oriented to person, place, and time.  Skin: Skin is warm.  Psychiatric: She has a normal mood and affect.    BP 132/86 mmHg  Pulse 70  Temp(Src) 97.1 F (36.2 C) (Oral)  Ht 5' 6"  (1.676 m)  Wt 182 lb (82.555 kg)  BMI 29.39 kg/m2      Assessment & Plan:  1. Allergic rhinitis, unspecified allergic rhinitis type   - azelastine (ASTELIN) 0.1 % nasal spray; Place 1 spray into both nostrils 2 (two) times daily. Use in each nostril as directed  Dispense: 30 mL; Refill: 5 - montelukast (SINGULAIR) 10 MG tablet; Take 1 tablet (10 mg total) by mouth at bedtime.  Dispense: 30 tablet; Refill: 5 - levocetirizine (XYZAL) 5  MG tablet; TAKE 1 TABLET (5 MG TOTAL) BY MOUTH EVERY EVENING.  Dispense: 30 tablet; Refill: 5  2. Gastroesophageal reflux disease, esophagitis presence not specified Avoid eating 2 hours before going to bed Avoid spicy food - omeprazole (PRILOSEC) 20 MG capsule; TAKE 1 CAPSULE (20 MG TOTAL) BY MOUTH DAILY.  Dispense: 30 capsule; Refill: 5  3. H/O: CVA (cerebrovascular accident) - CMP14+EGFR - NMR, lipoprofile - baclofen (LIORESAL) 10 MG tablet; Take 1 tablet (10 mg total) by mouth 2 (two) times daily.  Dispense: 60 tablet; Refill: 5  4. Foot drop, right   5. Rosacea,  acne Continue using metrogel   6. Seizures  - Carbamazepine level, total - carbamazepine (CARBATROL) 200 MG 12 hr capsule; Take 1 capsule (200 mg total) by mouth 2 (two) times daily.  Dispense: 60 capsule; Refill: 11  7. URI (upper respiratory infection) Increase fluid intake - azithromycin (ZITHROMAX) 250 MG tablet; USE AS DIRECTED.  Dispense: 6 tablet; Refill: 0 - methylPREDNISolone acetate (DEPO-MEDROL) injection 80 mg; Inject 1 mL (80 mg total) into the muscle once.  8. Depression - buPROPion (WELLBUTRIN XL) 150 MG 24 hr tablet; Take 1 tablet (150 mg total) by mouth daily.  Dispense: 30 tablet; Refill: 11    Labs pending Health maintenance reviewed Diet and exercise encouraged Continue all meds Follow up  In 3 months  [place], FNP

## 2014-07-19 ENCOUNTER — Telehealth: Payer: Self-pay | Admitting: Nurse Practitioner

## 2014-07-19 NOTE — Telephone Encounter (Signed)
zithromax satys in system for 10 days even thjough only take for 5 - should not need another antibiotic

## 2014-07-19 NOTE — Telephone Encounter (Signed)
Patient aware and verbalizes understanding. 

## 2014-09-24 ENCOUNTER — Ambulatory Visit (INDEPENDENT_AMBULATORY_CARE_PROVIDER_SITE_OTHER): Payer: Medicare Other | Admitting: Nurse Practitioner

## 2014-09-24 ENCOUNTER — Telehealth: Payer: Self-pay | Admitting: Nurse Practitioner

## 2014-09-24 ENCOUNTER — Encounter: Payer: Self-pay | Admitting: Nurse Practitioner

## 2014-09-24 VITALS — BP 132/88 | HR 73 | Temp 98.9°F | Ht 66.0 in | Wt 167.0 lb

## 2014-09-24 DIAGNOSIS — K625 Hemorrhage of anus and rectum: Secondary | ICD-10-CM | POA: Diagnosis not present

## 2014-09-24 DIAGNOSIS — K648 Other hemorrhoids: Secondary | ICD-10-CM

## 2014-09-24 MED ORDER — HYDROCORTISONE ACETATE 25 MG RE SUPP: 25.0000 mg | Freq: Two times a day (BID) | RECTAL | Status: DC

## 2014-09-24 NOTE — Patient Instructions (Addendum)

## 2014-09-24 NOTE — Progress Notes (Signed)
  Subjective:    Cheryl Reeves is a 44 y.o. female here for evaluation of blood in stool. Patient has associated symptoms of visible blood: just notes blood on TP. The patient denies abdominal pain, change in stool color: or, diarrhea and loose stools. The patient has a known history of: NSAID use and taking one aspirin a day. . The patient has had a few episodes of rectal bleeding. There is not a history of rectal injury. Patient has not had similar episodes of rectal bleeding in the past. She reports using preparation-h OTC.   The following portions of the patient's history were reviewed and updated as appropriate: allergies, current medications, past family history, past medical history, past social history, past surgical history and problem list.  Review of Systems Pertinent items are noted in HPI.    Objective:     BP 132/88 mmHg  Pulse 73  Temp(Src) 98.9 F (37.2 C) (Oral)  Ht 5\' 6"  (1.676 m)  Wt 167 lb (75.751 kg)  BMI 26.97 kg/m2   General Appearance:  Alert, cooperative, no distress, appears stated age  Head:  Normocephalic, without obvious abnormality, atraumatic  Eyes:  PERRL, conjunctiva/corneas clear, EOM's intact, fundi benign, both eyes  Ears:  Normal TM's and external ear canals, both ears  Nose: Nares normal, septum midline,mucosa normal, no drainage or sinus tenderness  Throat: Lips, mucosa, and tongue normal; teeth and gums normal  Neck: Supple, symmetrical, trachea midline, no adenopathy;  thyroid: not enlarged, symmetric, no tenderness/mass/nodules; no carotid bruit or JVD  Back:   Symmetric, no curvature, ROM normal, no CVA tenderness  Lungs:   Clear to auscultation bilaterally, respirations unlabored  Breasts:  No masses or tenderness  Heart:  Regular rate and rhythm, S1 and S2 normal, no murmur, rub, or gallop  Abdomen:   Soft, non-tender, bowel sounds active all four quadrants,  no masses, no organomegaly  Pelvic: Deferred- rectal- soft mildly thrombosed  hemorrhoid at 12 oclock position- blood around anal opening.  Extremities: Extremities normal, atraumatic, no cyanosis or edema  Pulses: 2+ and symmetric  Skin: Skin color, texture, turgor normal, no rashes or lesions  Lymph nodes: Cervical, supraclavicular, and axillary nodes normal  Neurologic: Normal       Assessment:  Internal hemorrhoid   Plan:   Meds ordered this encounter  Medications  . hydrocortisone (ANUSOL-HC) 25 MG suppository    Sig: Place 1 suppository (25 mg total) rectally 2 (two) times daily.    Dispense:  12 suppository    Refill:  2    Order Specific Question:  Supervising Provider    Answer:  Ernestina PennaMOORE, DONALD W [[id number]]   Stool softner daily- mirlax qAM Increase fiber in diet RTO if not improving  Mary-Margaret Daphine DeutscherMartin, FNP

## 2014-09-25 NOTE — Telephone Encounter (Signed)
That should be fine. 

## 2014-09-25 NOTE — Telephone Encounter (Signed)
Patient aware.

## 2014-10-05 DIAGNOSIS — R42 Dizziness and giddiness: Secondary | ICD-10-CM | POA: Diagnosis not present

## 2014-10-05 DIAGNOSIS — R55 Syncope and collapse: Secondary | ICD-10-CM | POA: Diagnosis not present

## 2014-10-08 ENCOUNTER — Telehealth: Payer: Self-pay | Admitting: Nurse Practitioner

## 2014-10-08 NOTE — Telephone Encounter (Signed)
Patient aware that we have not refilled any allergy medication since 07/13/2014

## 2014-11-23 ENCOUNTER — Encounter: Payer: Self-pay | Admitting: Family

## 2014-11-23 ENCOUNTER — Ambulatory Visit (INDEPENDENT_AMBULATORY_CARE_PROVIDER_SITE_OTHER): Payer: Medicare Other | Admitting: Family

## 2014-11-23 VITALS — BP 137/80 | HR 67 | Temp 97.1°F | Ht 66.0 in | Wt 162.0 lb

## 2014-11-23 DIAGNOSIS — B373 Candidiasis of vulva and vagina: Secondary | ICD-10-CM | POA: Diagnosis not present

## 2014-11-23 DIAGNOSIS — J0110 Acute frontal sinusitis, unspecified: Secondary | ICD-10-CM

## 2014-11-23 DIAGNOSIS — B3731 Acute candidiasis of vulva and vagina: Secondary | ICD-10-CM

## 2014-11-23 MED ORDER — METHYLPREDNISOLONE 4 MG PO TBPK: ORAL_TABLET | ORAL | Status: DC

## 2014-11-23 MED ORDER — FLUCONAZOLE 150 MG PO TABS: 150.0000 mg | ORAL_TABLET | Freq: Once | ORAL | Status: DC

## 2014-11-23 MED ORDER — AMOXICILLIN-POT CLAVULANATE 875-125 MG PO TABS: 1.0000 | ORAL_TABLET | Freq: Two times a day (BID) | ORAL | Status: DC

## 2014-11-23 NOTE — Progress Notes (Signed)
Subjective:    Patient ID: Cheryl Reeves, female    DOB: March 17, 1971, 44 y.o.   MRN: [id number]  Sinusitis This is a recurrent problem. The current episode started 1 to 4 weeks ago. The problem is unchanged. There has been no fever. Her pain is at a severity of 8/10. The pain is moderate. Associated symptoms include chills, congestion, ear pain (Left ear), headaches, a hoarse voice, sinus pressure and sneezing. Pertinent negatives include no coughing, shortness of breath or sore throat. Past treatments include antibiotics (pt has been taking her sons antibiotic cefdinir 300mg  bid for 6 days ). The treatment provided no relief.      Review of Systems  Constitutional: Positive for chills.  HENT: Positive for congestion, ear pain (Left ear), hoarse voice, sinus pressure and sneezing. Negative for sore throat.   Eyes: Negative.   Respiratory: Negative.  Negative for cough and shortness of breath.   Cardiovascular: Negative.  Negative for palpitations.  Gastrointestinal: Negative.   Endocrine: Negative.   Genitourinary: Negative.   Musculoskeletal: Negative.   Neurological: Positive for headaches.  Hematological: Negative.   Psychiatric/Behavioral: Negative.   All other systems reviewed and are negative.      Objective:   Physical Exam  Constitutional: She is oriented to person, place, and time. She appears well-developed and well-nourished. No distress.  HENT:  Head: Normocephalic and atraumatic.  Right Ear: External ear normal.  Left Ear: External ear normal.  Nose: Right sinus exhibits maxillary sinus tenderness and frontal sinus tenderness. Left sinus exhibits maxillary sinus tenderness and frontal sinus tenderness.  Nasal passage erythemas with mild swelling  Oropharynx erythemas   Eyes: Pupils are equal, round, and reactive to light.  Neck: Normal range of motion. Neck supple. No thyromegaly present.  Cardiovascular: Normal rate, regular rhythm, normal heart sounds and  intact distal pulses.   No murmur heard. Pulmonary/Chest: Effort normal and breath sounds normal. No respiratory distress. She has no wheezes.  Abdominal: Soft. Bowel sounds are normal. She exhibits no distension. There is no tenderness.  Musculoskeletal: Normal range of motion. She exhibits no edema or tenderness.  Neurological: She is alert and oriented to person, place, and time. She has normal reflexes. No cranial nerve deficit.  Skin: Skin is warm and dry.  Psychiatric: She has a normal mood and affect. Her behavior is normal. Judgment and thought content normal.  Vitals reviewed.     BP 137/80 mmHg  Pulse 67  Temp(Src) 97.1 F (36.2 C) (Oral)  Ht 5\' 6"  (1.676 m)  Wt 162 lb (73.483 kg)  BMI 26.16 kg/m2  LMP 11/14/2014 (Exact Date)     Assessment & Plan:  1. Acute frontal sinusitis, recurrence not specified - Take meds as prescribed - Use a cool mist humidifier  -Use saline nose sprays frequently -Saline irrigations of the nose can be very helpful if done frequently.  * 4X daily for 1 week*  * Use of a nettie pot can be helpful with this. Follow directions with this* -Force fluids -For any cough or congestion  Use plain Mucinex- regular strength or max strength is fine   * Children- consult with Pharmacist for dosing -For fever or aces or pains- take tylenol or ibuprofen appropriate for age and weight.  * for fevers greater than 101 orally you may alternate ibuprofen and tylenol every  3 hours. -Throat lozenges if help - amoxicillin-clavulanate (AUGMENTIN) 875-125 MG per tablet; Take 1 tablet by mouth 2 (two) times daily.  Dispense: 14 tablet;  Refill: 0 - methylPREDNISolone (MEDROL DOSEPAK) 4 MG TBPK tablet; Use as directed  Dispense: 21 tablet; Refill: 0  2. Vagina, candidiasis -Keep clean and dry -Cotton underwear -RTO prn  - fluconazole (DIFLUCAN) 150 MG tablet; Take 1 tablet (150 mg total) by mouth once.  Dispense: 1 tablet; Refill: 0  Jannifer Rodney, FNP

## 2014-11-23 NOTE — Patient Instructions (Signed)
Sinusitis Sinusitis is redness, soreness, and inflammation of the paranasal sinuses. Paranasal sinuses are air pockets within the bones of your face (beneath the eyes, the middle of the forehead, or above the eyes). In healthy paranasal sinuses, mucus is able to drain out, and air is able to circulate through them by way of your nose. However, when your paranasal sinuses are inflamed, mucus and air can become trapped. This can allow bacteria and other germs to grow and cause infection. Sinusitis can develop quickly and last only a short time (acute) or continue over a long period (chronic). Sinusitis that lasts for more than 12 weeks is considered chronic.  CAUSES  Causes of sinusitis include:  Allergies.  Structural abnormalities, such as displacement of the cartilage that separates your nostrils (deviated septum), which can decrease the air flow through your nose and sinuses and affect sinus drainage.  Functional abnormalities, such as when the small hairs (cilia) that line your sinuses and help remove mucus do not work properly or are not present. SIGNS AND SYMPTOMS  Symptoms of acute and chronic sinusitis are the same. The primary symptoms are pain and pressure around the affected sinuses. Other symptoms include:  Upper toothache.  Earache.  Headache.  Bad breath.  Decreased sense of smell and taste.  A cough, which worsens when you are lying flat.  Fatigue.  Fever.  Thick drainage from your nose, which often is green and may contain pus (purulent).  Swelling and warmth over the affected sinuses. DIAGNOSIS  Your health care provider will perform a physical exam. During the exam, your health care provider may:  Look in your nose for signs of abnormal growths in your nostrils (nasal polyps).  Tap over the affected sinus to check for signs of infection.  View the inside of your sinuses (endoscopy) using an imaging device that has a light attached (endoscope). If your health  care provider suspects that you have chronic sinusitis, one or more of the following tests may be recommended:  Allergy tests.  Nasal culture. A sample of mucus is taken from your nose, sent to a lab, and screened for bacteria.  Nasal cytology. A sample of mucus is taken from your nose and examined by your health care provider to determine if your sinusitis is related to an allergy. TREATMENT  Most cases of acute sinusitis are related to a viral infection and will resolve on their own within 10 days. Sometimes medicines are prescribed to help relieve symptoms (pain medicine, decongestants, nasal steroid sprays, or saline sprays).  However, for sinusitis related to a bacterial infection, your health care provider will prescribe antibiotic medicines. These are medicines that will help kill the bacteria causing the infection.  Rarely, sinusitis is caused by a fungal infection. In theses cases, your health care provider will prescribe antifungal medicine. For some cases of chronic sinusitis, surgery is needed. Generally, these are cases in which sinusitis recurs more than 3 times per year, despite other treatments. HOME CARE INSTRUCTIONS   Drink plenty of water. Water helps thin the mucus so your sinuses can drain more easily.  Use a humidifier.  Inhale steam 3 to 4 times a day (for example, sit in the bathroom with the shower running).  Apply a warm, moist washcloth to your face 3 to 4 times a day, or as directed by your health care provider.  Use saline nasal sprays to help moisten and clean your sinuses.  Take medicines only as directed by your health care provider.    If you were prescribed either an antibiotic or antifungal medicine, finish it all even if you start to feel better. SEEK IMMEDIATE MEDICAL CARE IF:  You have increasing pain or severe headaches.  You have nausea, vomiting, or drowsiness.  You have swelling around your face.  You have vision problems.  You have a stiff  neck.  You have difficulty breathing. MAKE SURE YOU:   Understand these instructions.  Will watch your condition.  Will get help right away if you are not doing well or get worse. Document Released: 05/25/2005 Document Revised: 10/09/2013 Document Reviewed: 06/09/2011 Exit[employer] Patient Information 2015 [employer]. This information is not intended to replace advice given to you by your health care provider. Make sure you discuss any questions you have with your health care provider.  - Take meds as prescribed - Use a cool mist humidifier  -Use saline nose sprays frequently -Saline irrigations of the nose can be very helpful if done frequently.  * 4X daily for 1 week*  * Use of a nettie pot can be helpful with this. Follow directions with this* -Force fluids -For any cough or congestion  Use plain Mucinex- regular strength or max strength is fine   * Children- consult with Pharmacist for dosing -For fever or aces or pains- take tylenol or ibuprofen appropriate for age and weight.  * for fevers greater than 101 orally you may alternate ibuprofen and tylenol every  3 hours. -Throat lozenges if help   Lela Murfin, FNP  

## 2014-11-26 DIAGNOSIS — Z1231 Encounter for screening mammogram for malignant neoplasm of breast: Secondary | ICD-10-CM | POA: Diagnosis not present

## 2014-11-27 DIAGNOSIS — Z1231 Encounter for screening mammogram for malignant neoplasm of breast: Secondary | ICD-10-CM | POA: Diagnosis not present

## 2014-11-29 DIAGNOSIS — J329 Chronic sinusitis, unspecified: Secondary | ICD-10-CM | POA: Diagnosis not present

## 2014-11-29 DIAGNOSIS — Z87891 Personal history of nicotine dependence: Secondary | ICD-10-CM | POA: Diagnosis not present

## 2014-11-29 DIAGNOSIS — Z8673 Personal history of transient ischemic attack (TIA), and cerebral infarction without residual deficits: Secondary | ICD-10-CM | POA: Diagnosis not present

## 2014-11-29 DIAGNOSIS — R220 Localized swelling, mass and lump, head: Secondary | ICD-10-CM | POA: Diagnosis not present

## 2014-11-29 DIAGNOSIS — Z885 Allergy status to narcotic agent status: Secondary | ICD-10-CM | POA: Diagnosis not present

## 2014-11-29 DIAGNOSIS — Z79899 Other long term (current) drug therapy: Secondary | ICD-10-CM | POA: Diagnosis not present

## 2014-11-29 DIAGNOSIS — G9389 Other specified disorders of brain: Secondary | ICD-10-CM | POA: Diagnosis not present

## 2014-11-29 DIAGNOSIS — R51 Headache: Secondary | ICD-10-CM | POA: Diagnosis not present

## 2014-11-29 DIAGNOSIS — L03211 Cellulitis of face: Secondary | ICD-10-CM | POA: Diagnosis not present

## 2014-11-29 DIAGNOSIS — Z96651 Presence of right artificial knee joint: Secondary | ICD-10-CM | POA: Diagnosis not present

## 2014-11-30 ENCOUNTER — Telehealth: Payer: Self-pay | Admitting: Nurse Practitioner

## 2014-11-30 NOTE — Telephone Encounter (Signed)
Patient was diagnosed with cellulitis behind sinuses last night at ER. Patient states that she was given Bactrim. Advised patient to take all meds and gave her an appt for follow up when abt is finished. Advised if she had any increasing HA, fever, or unusual facial swelling to let us know or go to the ER. Patient verbalized understanding

## 2014-12-17 ENCOUNTER — Encounter: Payer: Self-pay | Admitting: Family Medicine

## 2014-12-26 ENCOUNTER — Ambulatory Visit (INDEPENDENT_AMBULATORY_CARE_PROVIDER_SITE_OTHER): Payer: Medicare Other | Admitting: Nurse Practitioner

## 2014-12-26 ENCOUNTER — Encounter: Payer: Self-pay | Admitting: Nurse Practitioner

## 2014-12-26 VITALS — BP 141/86 | HR 71 | Temp 97.0°F | Ht 66.0 in | Wt 161.0 lb

## 2014-12-26 DIAGNOSIS — Z0000 Encounter for general adult medical examination without abnormal findings: Secondary | ICD-10-CM | POA: Diagnosis not present

## 2014-12-26 DIAGNOSIS — Z01419 Encounter for gynecological examination (general) (routine) without abnormal findings: Secondary | ICD-10-CM | POA: Diagnosis not present

## 2014-12-26 DIAGNOSIS — L719 Rosacea, unspecified: Secondary | ICD-10-CM | POA: Diagnosis not present

## 2014-12-26 DIAGNOSIS — M21371 Foot drop, right foot: Secondary | ICD-10-CM

## 2014-12-26 DIAGNOSIS — E785 Hyperlipidemia, unspecified: Secondary | ICD-10-CM | POA: Diagnosis not present

## 2014-12-26 DIAGNOSIS — Z8673 Personal history of transient ischemic attack (TIA), and cerebral infarction without residual deficits: Secondary | ICD-10-CM | POA: Diagnosis not present

## 2014-12-26 DIAGNOSIS — K219 Gastro-esophageal reflux disease without esophagitis: Secondary | ICD-10-CM | POA: Diagnosis not present

## 2014-12-26 DIAGNOSIS — R5383 Other fatigue: Secondary | ICD-10-CM | POA: Diagnosis not present

## 2014-12-26 DIAGNOSIS — R569 Unspecified convulsions: Secondary | ICD-10-CM

## 2014-12-26 DIAGNOSIS — J309 Allergic rhinitis, unspecified: Secondary | ICD-10-CM

## 2014-12-26 DIAGNOSIS — E559 Vitamin D deficiency, unspecified: Secondary | ICD-10-CM | POA: Diagnosis not present

## 2014-12-26 LAB — POCT CBC
Granulocyte percent: 61.4 %G (ref 37–80)
HCT, POC: 39.5 % (ref 37.7–47.9)
Hemoglobin: 12.9 g/dL (ref 12.2–16.2)
LYMPH, POC: 1.5 (ref 0.6–3.4)
MCH: 29.8 pg (ref 27–31.2)
MCHC: 32.6 g/dL (ref 31.8–35.4)
MCV: 91.5 fL (ref 80–97)
MPV: 6.6 fL (ref 0–99.8)
POC GRANULOCYTE: 2.9 (ref 2–6.9)
POC LYMPH PERCENT: 32.6 %L (ref 10–50)
Platelet Count, POC: 295 10*3/uL (ref 142–424)
RBC: 4.31 M/uL (ref 4.04–5.48)
RDW, POC: 13 %
WBC: 4.7 10*3/uL (ref 4.6–10.2)

## 2014-12-26 LAB — POCT UA - MICROSCOPIC ONLY
Bacteria, U Microscopic: NEGATIVE
CRYSTALS, UR, HPF, POC: NEGATIVE
Casts, Ur, LPF, POC: NEGATIVE
Mucus, UA: NEGATIVE
RBC, URINE, MICROSCOPIC: NEGATIVE
WBC, Ur, HPF, POC: NEGATIVE
YEAST UA: NEGATIVE

## 2014-12-26 LAB — POCT URINALYSIS DIPSTICK
Bilirubin, UA: NEGATIVE
Blood, UA: NEGATIVE
GLUCOSE UA: NEGATIVE
Ketones, UA: NEGATIVE
LEUKOCYTES UA: NEGATIVE
Nitrite, UA: NEGATIVE
Protein, UA: NEGATIVE
Spec Grav, UA: 1.01
UROBILINOGEN UA: NEGATIVE
pH, UA: 8

## 2014-12-26 MED ORDER — BACLOFEN 10 MG PO TABS: 10.0000 mg | ORAL_TABLET | Freq: Two times a day (BID) | ORAL | Status: DC

## 2014-12-26 NOTE — Patient Instructions (Signed)

## 2014-12-26 NOTE — Progress Notes (Signed)
Subjective:    Patient ID: Cheryl Reeves, female    DOB: 02-23-1971, 44 y.o.   MRN: [id number]  HPI Patient in today for complete physical exam and pap- she is doing well today without complaints. Current medical problems include: Allergic rhinitis- currently doing well without - uses astelin on occasion GERD- current;y on no meds- doing diet control Hx CVA - right sided paralysis and walks with a cane- currently on baby aspirin daily Seizures- takes carbatrol - has had no recent seizures- sees neurologist every 6 months Depression- wellbutrin Xl working well to keep her calm and from being sad all the time- has no side effects from meds.    Review of Systems  Constitutional: Negative.   HENT: Negative.   Respiratory: Negative.   Cardiovascular: Negative.   Gastrointestinal: Negative.   Genitourinary: Negative.   Neurological: Negative.   Psychiatric/Behavioral: Negative.   All other systems reviewed and are negative.      Objective:   Physical Exam  Constitutional: She is oriented to person, place, and time. She appears well-developed and well-nourished.  HENT:  Head: Normocephalic.  Right Ear: Hearing, tympanic membrane, external ear and ear canal normal.  Left Ear: Hearing, tympanic membrane, external ear and ear canal normal.  Nose: Nose normal.  Mouth/Throat: Uvula is midline, oropharynx is clear and moist and mucous membranes are normal.  Eyes: Conjunctivae and EOM are normal. Pupils are equal, round, and reactive to light.  Neck: Normal range of motion and full passive range of motion without pain. Neck supple. No JVD present. Carotid bruit is not present. No thyroid mass and no thyromegaly present.  Cardiovascular: Normal rate, normal heart sounds and intact distal pulses.   No murmur heard. Pulmonary/Chest: Effort normal and breath sounds normal. She has no wheezes. She has no rales. Right breast exhibits no inverted nipple, no mass, no nipple discharge, no skin  change and no tenderness. Left breast exhibits no inverted nipple, no mass, no nipple discharge, no skin change and no tenderness.  Abdominal: Soft. Bowel sounds are normal. She exhibits no mass. There is no tenderness.  Genitourinary: Vagina normal and uterus normal. No breast swelling, tenderness, discharge or bleeding.  bimanual exam-No adnexal masses or tenderness. Cervix nonparous and pink- no vaginal discharge  Musculoskeletal: Normal range of motion.  Right upper and lower extremity weakness- ambulates with a cane  Lymphadenopathy:    She has no cervical adenopathy.  Neurological: She is alert and oriented to person, place, and time. She has normal reflexes.  Skin: Skin is warm and dry.  Psychiatric: She has a normal mood and affect. Her behavior is normal. Judgment and thought content normal.   BP 141/86 mmHg  Pulse 71  Temp(Src) 97 F (36.1 C) (Oral)  Ht 5\' 6"  (1.676 m)  Wt 161 lb (73.029 kg)  BMI 26.00 kg/m2        Assessment & Plan:  1. Annual physical exam - POCT UA - Microscopic Only - POCT urinalysis dipstick  2. Encounter for routine gynecological examination  3. Gastroesophageal reflux disease, esophagitis presence not specified Avoid spicy foods Do not eat 2 hours prior to bedtime  4. Seizures Keep follow up with neurologist  5. H/O: CVA (cerebrovascular accident) - baclofen (LIORESAL) 10 MG tablet; Take 1 tablet (10 mg total) by mouth 2 (two) times daily.  Dispense: 60 tablet; Refill: 5  6. Foot drop, right Fall precautions  7. Allergic rhinitis, unspecified allergic rhinitis type Try flonase OTC  8. Rosacea, acne  Labs pending Health maintenance reviewed Diet and exercise encouraged Continue all meds Follow up  In 6 month   [place], FNP

## 2014-12-27 LAB — CMP14+EGFR
A/G RATIO: 1.7 (ref 1.1–2.5)
ALT: 5 IU/L (ref 0–32)
AST: 12 IU/L (ref 0–40)
Albumin: 4.2 g/dL (ref 3.5–5.5)
Alkaline Phosphatase: 75 IU/L (ref 39–117)
BUN/Creatinine Ratio: 10 (ref 9–23)
BUN: 6 mg/dL (ref 6–24)
Bilirubin Total: 0.3 mg/dL (ref 0.0–1.2)
CALCIUM: 9 mg/dL (ref 8.7–10.2)
CHLORIDE: 96 mmol/L — AB (ref 97–108)
CO2: 23 mmol/L (ref 18–29)
CREATININE: 0.62 mg/dL (ref 0.57–1.00)
GFR calc Af Amer: 127 mL/min/{1.73_m2} (ref >59)
GFR calc non Af Amer: 110 mL/min/{1.73_m2} (ref >59)
GLOBULIN, TOTAL: 2.5 g/dL (ref 1.5–4.5)
Glucose: 90 mg/dL (ref 65–99)
Potassium: 4.4 mmol/L (ref 3.5–5.2)
Sodium: 133 mmol/L — ABNORMAL LOW (ref 134–144)
Total Protein: 6.7 g/dL (ref 6.0–8.5)

## 2014-12-27 LAB — LIPID PANEL
Chol/HDL Ratio: 3.1 ratio units (ref 0.0–4.4)
Cholesterol, Total: 179 mg/dL (ref 100–199)
HDL: 57 mg/dL (ref >39)
LDL Calculated: 102 mg/dL — ABNORMAL HIGH (ref 0–99)
TRIGLYCERIDES: 101 mg/dL (ref 0–149)
VLDL Cholesterol Cal: 20 mg/dL (ref 5–40)

## 2014-12-27 LAB — THYROID PANEL WITH TSH
Free Thyroxine Index: 1.5 (ref 1.2–4.9)
T3 Uptake Ratio: 26 % (ref 24–39)
T4 TOTAL: 5.7 ug/dL (ref 4.5–12.0)
TSH: 1.94 u[IU]/mL (ref 0.450–4.500)

## 2014-12-27 LAB — VITAMIN D 25 HYDROXY (VIT D DEFICIENCY, FRACTURES): Vit D, 25-Hydroxy: 38.5 ng/mL (ref 30.0–100.0)

## 2014-12-28 LAB — PAP IG W/ RFLX HPV ASCU: PAP Smear Comment: 0

## 2015-01-16 DIAGNOSIS — R51 Headache: Secondary | ICD-10-CM | POA: Diagnosis not present

## 2015-01-16 DIAGNOSIS — J329 Chronic sinusitis, unspecified: Secondary | ICD-10-CM | POA: Diagnosis not present

## 2015-01-22 ENCOUNTER — Ambulatory Visit (INDEPENDENT_AMBULATORY_CARE_PROVIDER_SITE_OTHER): Payer: Medicare Other | Admitting: Neurology

## 2015-01-22 ENCOUNTER — Encounter: Payer: Self-pay | Admitting: Neurology

## 2015-01-22 VITALS — BP 118/77 | HR 61 | Ht 66.0 in | Wt 164.0 lb

## 2015-01-22 DIAGNOSIS — I639 Cerebral infarction, unspecified: Secondary | ICD-10-CM | POA: Diagnosis not present

## 2015-01-22 DIAGNOSIS — G43709 Chronic migraine without aura, not intractable, without status migrainosus: Secondary | ICD-10-CM | POA: Diagnosis not present

## 2015-01-22 MED ORDER — DICLOFENAC POTASSIUM(MIGRAINE) 50 MG PO PACK: 50.0000 mg | PACK | ORAL | Status: DC | PRN

## 2015-01-22 MED ORDER — ONDANSETRON 4 MG PO TBDP: 4.0000 mg | ORAL_TABLET | Freq: Three times a day (TID) | ORAL | Status: DC | PRN

## 2015-01-22 MED ORDER — NORTRIPTYLINE HCL 25 MG PO CAPS: ORAL_CAPSULE | ORAL | Status: DC

## 2015-01-22 NOTE — Progress Notes (Addendum)
PATIENT: Cheryl Reeves DOB: Sep 29, 1970  Chief Complaint  Patient presents with  . Headache    She is still having daily headaches.  She has recently had a CT Brain that just showed her old CVA.  She also had a CT Sinus that was negative for sinusitis but the MD told her it could be preseptal orbital cellulitis.  She has completed 30 days of antibiotics.  . Cerebrovascular Accident    Feels baclofen keeps her right leg spasms under good control.     HISTORICAL  Cheryl Reeves is a 44 years old right-handed female, seen in refer by her primary care physician Dr. Rudi Heap for evaluation of frequent headaches, last visit was with Dr. Hosie Poisson in 2015  She suffered stroke in 1999, was attributed to post partum hemorrhage, with mild aphasia, mild right upper extremity weakness, spastic gait right lower extremity weakness, she also had one seizure when she had a stroke, recurrent seizure in 2001, taking Tegretol 200 mg twice a day  She has frequent headaches even before she had stroke, contributed her sinus headache, had severe headache the day she had stroke, and persistent almost daily headache ever since, since 2014, she has moderate sinus pressure headaches, has been taking daily ibuprofen 10-12 tablets each day, has tried different over-the-counter sinus medications without helping her symptoms.  She never tried preventive medications in the past, she complains of excessive stress, her son just moved out from her house to live with her ex-husband, has difficulty sleeping, is taking Wellbutrin 150 mg every morning  I have personally reviewed MRI of the brain in 2007, left frontal encephalomalacia  REVIEW OF SYSTEMS: Full 14 system review of systems performed and notable only for constipation, snoring, back pain, achy muscles, walking difficulty, frequent infections, bruise easily, headaches, seizure, weakness, depression, anxiety  ALLERGIES: Allergies  Allergen Reactions  . Codeine     headaches    HOME MEDICATIONS: Current Outpatient Prescriptions  Medication Sig Dispense Refill  . aspirin 81 MG tablet Take 81 mg by mouth daily.    . baclofen (LIORESAL) 10 MG tablet Take 1 tablet (10 mg total) by mouth 2 (two) times daily. 60 tablet 5  . buPROPion (WELLBUTRIN XL) 150 MG 24 hr tablet Take 1 tablet (150 mg total) by mouth daily. 30 tablet 11  . carbamazepine (CARBATROL) 200 MG 12 hr capsule Take 1 capsule (200 mg total) by mouth 2 (two) times daily. 60 capsule 11  . Diclofenac Potassium 50 MG PACK Take 50 mg by mouth as needed. 15 each 6  . nortriptyline (PAMELOR) 25 MG capsule One po qhs xone week, then 2 tabs po qhs 60 capsule 6  . ondansetron (ZOFRAN ODT) 4 MG disintegrating tablet Take 1 tablet (4 mg total) by mouth every 8 (eight) hours as needed for nausea or vomiting. 20 tablet 6   No current facility-administered medications for this visit.    PAST MEDICAL HISTORY: Past Medical History  Diagnosis Date  . GERD (gastroesophageal reflux disease)   . Allergy   . Wears glasses   . Stroke 1999    S/p childbirth-weakness rt leg  . DJD (degenerative joint disease)     PAST SURGICAL HISTORY: Past Surgical History  Procedure Laterality Date  . Tubal ligation  2000  . Replacement total knee Right 2009  . Arthroscopy knee w/ drilling  2008    rt  . Sinus endo w/fusion Bilateral 07/21/2013    Procedure: ENDOSCOPIC SINUS SURGERY WITH FUSION  NAVIGATION;  Surgeon: Suzanna Obey, MD;  Location: [facility];  Service: ENT;  Laterality: Bilateral;  . Septoplasty Bilateral 07/21/2013    Procedure: SEPTOPLASTY;  Surgeon: Suzanna Obey, MD;  Location: [facility];  Service: ENT;  Laterality: Bilateral;    FAMILY HISTORY: Family History  Problem Relation Age of Onset  . Cancer Mother     lung  . Heart disease Father   . Diabetes    . Arthritis/Rheumatoid      SOCIAL HISTORY:  Social History   Social History  . Marital Status: Single     Spouse Name: N/A  . Number of Children: N/A  . Years of Education: N/A   Occupational History  . Not on file.   Social History Main Topics  . Smoking status: Former Smoker    Quit date: 12/06/2011  . Smokeless tobacco: Never Used  . Alcohol Use: No  . Drug Use: No  . Sexual Activity: Not on file   Other Topics Concern  . Not on file   Social History Narrative   Single, 1 child   Right handed   10 th grade   1/2-1 cup daily     PHYSICAL EXAM   Filed Vitals:   01/22/15 1003  BP: 118/77  Pulse: 61  Height:  (1.676 m)  Weight: 164 lb (74.39 kg)    Not recorded      Body mass index is 26.48 kg/(m^2).  PHYSICAL EXAMNIATION:  Gen: NAD, conversant, well nourised, obese, well groomed                     Cardiovascular: Regular rate rhythm, no peripheral edema, warm, nontender. Eyes: Conjunctivae clear without exudates or hemorrhage Neck: Supple, no carotid bruise. Pulmonary: Clear to auscultation bilaterally   NEUROLOGICAL EXAM:  MENTAL STATUS: Speech:  She has mild expressive aphasia, normal comprehension  Cognition:     Orientation to time, place and person     Normal recent and remote memory     Normal Attention span and concentration     Normal Language, naming, repeating,spontaneous speech     Fund of knowledge   CRANIAL NERVES: CN II: Visual fields are full to confrontation. Fundoscopic exam is normal with sharp discs and no vascular changes. Pupils are round equal and briskly reactive to light. CN III, IV, VI: extraocular movement are normal. No ptosis. CN V: Facial sensation is intact to pinprick in all 3 divisions bilaterally. Corneal responses are intact.  CN VII: Face is symmetric with normal eye closure and smile. CN VIII: Hearing is normal to rubbing fingers CN IX, X: Palate elevates symmetrically. Phonation is normal. CN XI: Head turning and shoulder shrug are intact CN XII: Tongue is midline with normal movements and no  atrophy.  MOTOR: Mild right arm proximal and distal weakness 5 minus, moderate right lower extremity spasticity and proximal and distal weakness, right foot tends to stay in right ankle plantarflexion  REFLEXES: Hyperreflexia at the right sideck, position sense, and vibration sense are intact in fingers and toes on the left, extensor on the right .  COORDINATION: Rapid alternating movements and fine finger movements are intact. There is no dysmetria on finger-to-nose and heel-knee-shin.    GAIT/STANCE: Right spastic hemi-circumferential gait  DIAGNOSTIC DATA (LABS, IMAGING, TESTING) - I reviewed patient records, labs, notes, testing and imaging myself where available.   ASSESSMENT AND PLAN  Cheryl Reeves is a 44 y.o. female  History of left  frontal stroke Chronic headaches  Some migraine features, a component of medicine rebound headaches  I have advised her stop daily ibuprofen use  Start preventive medications nortriptyline 25 mg titrating to 50 mg every night  Not a candidate for triptan treatment   [place] as needed    Levert Feinstein, M.D. Ph.D.  [facility] [address], [address] [place], [place] [address] Ph: [phone] Fax: [phone]  CC: Referring Provider

## 2015-02-25 ENCOUNTER — Ambulatory Visit (INDEPENDENT_AMBULATORY_CARE_PROVIDER_SITE_OTHER): Payer: Medicare Other | Admitting: Neurology

## 2015-02-25 ENCOUNTER — Encounter: Payer: Self-pay | Admitting: Neurology

## 2015-02-25 VITALS — BP 132/84 | HR 84 | Ht 66.0 in | Wt 162.0 lb

## 2015-02-25 DIAGNOSIS — I639 Cerebral infarction, unspecified: Secondary | ICD-10-CM

## 2015-02-25 DIAGNOSIS — M545 Low back pain, unspecified: Secondary | ICD-10-CM

## 2015-02-25 DIAGNOSIS — G43709 Chronic migraine without aura, not intractable, without status migrainosus: Secondary | ICD-10-CM | POA: Diagnosis not present

## 2015-02-25 DIAGNOSIS — G8110 Spastic hemiplegia affecting unspecified side: Secondary | ICD-10-CM

## 2015-02-25 NOTE — Progress Notes (Signed)
Chief Complaint  Patient presents with  . Headache    Feels headaches have improved with nortriptyline. She is only taking nortriptyline  at bedtime.  She is still taking ibuprofen  four times weekly for back pain.  She was unable to afford Cambia.        PATIENT: Cheryl Reeves DOB: 1970/07/06  Chief Complaint  Patient presents with  . Headache    Feels headaches have improved with nortriptyline. She is only taking nortriptyline  at bedtime.  She is still taking ibuprofen  four times weekly for back pain.  She was unable to afford Cambia.       HISTORICAL  Cheryl Reeves is a 44 years old right-handed female, seen in refer by her primary care physician Dr. Rudi Heap for evaluation of frequent headaches, last visit was with Dr. Hosie Poisson in 2015  She suffered stroke in 1999, was attributed to post partum hemorrhage, with mild aphasia, mild right upper extremity weakness, spastic gait right lower extremity weakness, she also had one seizure when she had a stroke, recurrent seizure in 2001, taking Tegretol 200 mg twice a day  She has frequent headaches even before she had stroke, contributed her sinus headache, had severe headache the day she had stroke, and persistent almost daily headache ever since, since 2014, she has moderate sinus pressure headaches, has been taking daily ibuprofen 10-12 tablets each day, has tried different over-the-counter sinus medications without helping her symptoms.  She never tried preventive medications in the past, she complains of excessive stress, her son just moved out from her house to live with her ex-husband, has difficulty sleeping, is taking Wellbutrin 150 mg every morning  I have personally reviewed MRI of the brain in 2007, left frontal encephalomalacia  UPDATE Feb 25 2015: She only take one nortirptyline  qhs, instead of 2 tablets every night, which has been helpful, she no longer has constant headaches, but she has to take  ibuprofen up to 5 tablets each time for her right side low back pain, she complains of right hip, low back pain, related to her abnormal gait, she denies shooting pain to her right lower extremity  REVIEW OF SYSTEMS: Full 14 system review of systems performed and notable only for seizure, weakness, depression, back pain, walking difficulty, neck stiffness, snoring  ALLERGIES: Allergies  Allergen Reactions  . Codeine     headaches    HOME MEDICATIONS: Current Outpatient Prescriptions  Medication Sig Dispense Refill  . aspirin 81 MG tablet Take 81 mg by mouth daily.    . baclofen (LIORESAL) 10 MG tablet Take 1 tablet (10 mg total) by mouth 2 (two) times daily. 60 tablet 5  . buPROPion (WELLBUTRIN XL) 150 MG 24 hr tablet Take 1 tablet (150 mg total) by mouth daily. 30 tablet 11  . carbamazepine (CARBATROL) 200 MG 12 hr capsule Take 1 capsule (200 mg total) by mouth 2 (two) times daily. 60 capsule 11  . nortriptyline (PAMELOR) 25 MG capsule One po qhs xone week, then 2 tabs po qhs 60 capsule 6  . ondansetron (ZOFRAN ODT) 4 MG disintegrating tablet Take 1 tablet (4 mg total) by mouth every 8 (eight) hours as needed for nausea or vomiting. 20 tablet 6   No current facility-administered medications for this visit.    PAST MEDICAL HISTORY: Past Medical History  Diagnosis Date  . GERD (gastroesophageal reflux disease)   . Allergy   . Wears glasses   . Stroke 1999  S/p childbirth-weakness rt leg  . DJD (degenerative joint disease)     PAST SURGICAL HISTORY: Past Surgical History  Procedure Laterality Date  . Tubal ligation  2000  . Replacement total knee Right 2009  . Arthroscopy knee w/ drilling  2008    rt  . Sinus endo w/fusion Bilateral 07/21/2013    Procedure: ENDOSCOPIC SINUS SURGERY WITH FUSION NAVIGATION;  Surgeon: Suzanna Obey, MD;  Location: [facility];  Service: ENT;  Laterality: Bilateral;  . Septoplasty Bilateral 07/21/2013    Procedure: SEPTOPLASTY;   Surgeon: Suzanna Obey, MD;  Location: [facility];  Service: ENT;  Laterality: Bilateral;    FAMILY HISTORY: Family History  Problem Relation Age of Onset  . Cancer Mother     lung  . Heart disease Father   . Diabetes    . Arthritis/Rheumatoid      SOCIAL HISTORY:  Social History   Social History  . Marital Status: Single    Spouse Name: N/A  . Number of Children: N/A  . Years of Education: N/A   Occupational History  . Not on file.   Social History Main Topics  . Smoking status: Former Smoker    Quit date: 12/06/2011  . Smokeless tobacco: Never Used  . Alcohol Use: No  . Drug Use: No  . Sexual Activity: Not on file   Other Topics Concern  . Not on file   Social History Narrative   Single, 1 child   Right handed   10 th grade   1/2-1 cup daily     PHYSICAL EXAM   Filed Vitals:   02/25/15 0825  BP: 132/84  Pulse: 84  Height:  (1.676 m)  Weight: 162 lb (73.483 kg)    Not recorded      Body mass index is 26.16 kg/(m^2).  PHYSICAL EXAMNIATION:  Gen: NAD, conversant, well nourised, obese, well groomed                     Cardiovascular: Regular rate rhythm, no peripheral edema, warm, nontender. Eyes: Conjunctivae clear without exudates or hemorrhage Neck: Supple, no carotid bruise. Pulmonary: Clear to auscultation bilaterally   NEUROLOGICAL EXAM:  MENTAL STATUS: Speech:  She has mild expressive aphasia, normal comprehension  Cognition:     Orientation to time, place and person     Normal recent and remote memory     Normal Attention span and concentration     Normal Language, naming, repeating,spontaneous speech     Fund of knowledge   CRANIAL NERVES: CN II: Visual fields are full to confrontation. Fundoscopic exam is normal with sharp discs and no vascular changes. Pupils are round equal and briskly reactive to light. CN III, IV, VI: extraocular movement are normal. No ptosis. CN V: Facial sensation is intact to pinprick  in all 3 divisions bilaterally. Corneal responses are intact.  CN VII: Face is symmetric with normal eye closure and smile. CN VIII: Hearing is normal to rubbing fingers CN IX, X: Palate elevates symmetrically. Phonation is normal. CN XI: Head turning and shoulder shrug are intact CN XII: Tongue is midline with normal movements and no atrophy.  MOTOR: Mild right arm proximal and distal weakness 5 minus, moderate right lower extremity spasticity and proximal and distal weakness, right foot tends to stay in right ankle plantarflexion  REFLEXES: Hyperreflexia at the right side  Sensory: Intact to light touch  COORDINATION: Rapid alternating movements and fine finger movements are intact. There  is no dysmetria on finger-to-nose and heel-knee-shin.    GAIT/STANCE: Right spastic hemi-circumferential gait  DIAGNOSTIC DATA (LABS, IMAGING, TESTING) - I reviewed patient records, labs, notes, testing and imaging myself where available.   ASSESSMENT AND PLAN  Cheryl Reeves is a 44 y.o. female  History of left frontal stroke, with residual spastic right hemiparesis, unsteady gait  Right side low back pain  I encouraged her continue moderate exercise, she wants to hold off physical therapy referral at this point Chronic headaches  Some migraine features, a component of medicine rebound headaches  Her headache has much improved with nortriptyline 25 mg, have suggested her titrating to 50 mg every night   Not a candidate for triptan treatment   Cambia was not covered by her insurance company  Return to clinic in 3 months with nurse practitioner  Levert Feinstein, M.D. Ph.D.  [facility] [address], [address] [place], [place] [address] Ph: [phone] Fax: [phone]  CC: Referring Provider

## 2015-04-26 ENCOUNTER — Telehealth: Payer: Self-pay | Admitting: Nurse Practitioner

## 2015-04-26 NOTE — Telephone Encounter (Signed)
appt given per patient request 

## 2015-04-29 ENCOUNTER — Ambulatory Visit (INDEPENDENT_AMBULATORY_CARE_PROVIDER_SITE_OTHER): Payer: Medicare Other

## 2015-04-29 ENCOUNTER — Encounter: Payer: Self-pay | Admitting: Nurse Practitioner

## 2015-04-29 ENCOUNTER — Ambulatory Visit (INDEPENDENT_AMBULATORY_CARE_PROVIDER_SITE_OTHER): Payer: Medicare Other | Admitting: Nurse Practitioner

## 2015-04-29 VITALS — BP 153/93 | HR 85 | Temp 97.3°F | Ht 66.0 in | Wt 158.0 lb

## 2015-04-29 DIAGNOSIS — K5900 Constipation, unspecified: Secondary | ICD-10-CM

## 2015-04-29 DIAGNOSIS — R1084 Generalized abdominal pain: Secondary | ICD-10-CM

## 2015-04-29 NOTE — Patient Instructions (Signed)

## 2015-04-29 NOTE — Progress Notes (Signed)
   Subjective:    Patient ID: Cheryl Reeves, female    DOB: 02/01/1971, 44 y.o.   MRN: [id number]  HPI Patient in today c/o nausea for about 2 months- she says that she stays constipated and has been more so then usual for the last several months as well. SHe has been taking a stool softner daily but has not helped. Nausea is constant. Not eating much because she does not fell like eating.    Review of Systems  Constitutional: Negative.   HENT: Negative.   Respiratory: Negative.   Cardiovascular: Negative.   Gastrointestinal: Positive for constipation.  Genitourinary: Negative.   Musculoskeletal: Negative.   Neurological: Negative.   Psychiatric/Behavioral: Negative.   All other systems reviewed and are negative.      Objective:   Physical Exam  Constitutional: She is oriented to person, place, and time. She appears well-developed and well-nourished.  Cardiovascular: Normal rate, regular rhythm and normal heart sounds.   Pulmonary/Chest: Effort normal and breath sounds normal.  Abdominal: Soft. Bowel sounds are normal. There is tenderness (mild diffuse).  Neurological: She is alert and oriented to person, place, and time.  Skin: Skin is warm.  Psychiatric: She has a normal mood and affect. Her behavior is normal. Judgment and thought content normal.    BP 153/93 mmHg  Pulse 85  Temp(Src) 97.3 F (36.3 C) (Oral)  Ht 5\' 6"  (1.676 m)  Wt 158 lb (71.668 kg)  BMI 25.51 kg/m2       Assessment & Plan:   1. Generalized abdominal pain   2. Constipation, unspecified constipation type    Milk of magensia dose and 6 oz prune juice Increase fiber in diet miralax in apple juice daily Force fluids RTO prn  Mary-Margaret Daphine DeutscherMartin, FNP

## 2015-06-05 ENCOUNTER — Ambulatory Visit: Payer: Medicare Other | Admitting: Nurse Practitioner

## 2015-06-12 ENCOUNTER — Encounter: Payer: Self-pay | Admitting: Nurse Practitioner

## 2015-06-12 ENCOUNTER — Ambulatory Visit (INDEPENDENT_AMBULATORY_CARE_PROVIDER_SITE_OTHER): Payer: Medicare Other | Admitting: Nurse Practitioner

## 2015-06-12 VITALS — BP 170/108 | HR 83 | Ht 66.0 in | Wt 155.6 lb

## 2015-06-12 DIAGNOSIS — Z8673 Personal history of transient ischemic attack (TIA), and cerebral infarction without residual deficits: Secondary | ICD-10-CM

## 2015-06-12 DIAGNOSIS — G43909 Migraine, unspecified, not intractable, without status migrainosus: Secondary | ICD-10-CM

## 2015-06-12 DIAGNOSIS — R569 Unspecified convulsions: Secondary | ICD-10-CM | POA: Diagnosis not present

## 2015-06-12 DIAGNOSIS — M21371 Foot drop, right foot: Secondary | ICD-10-CM | POA: Diagnosis not present

## 2015-06-12 NOTE — Progress Notes (Signed)
[facility]  PATIENT: Cheryl Reeves DOB: 22-Oct-1970   REASON FOR VISIT: follow up for hx of stroke, chronic migraine, spastic hemiplegia, right-sided low back pain, history of seizure disorder HISTORY FROM: Patient    HISTORY OF PRESENT ILLNESS: HISTORY: Cheryl Reeves is a 45 years old right-handed female, seen in refer by her primary care physician Dr. Rudi Heaponald Moore for evaluation of frequent headaches, last visit was with Dr. Hosie PoissonSumner in 2015  She suffered stroke in 1999, was attributed to post partum hemorrhage, with mild aphasia, mild right upper extremity weakness, spastic gait right lower extremity weakness, she also had one seizure when she had a stroke, recurrent seizure in 2001, taking Tegretol 200 mg twice a day  She has frequent headaches even before she had stroke, contributed her sinus headache, had severe headache the day she had stroke, and persistent almost daily headache ever since, since 2014, she has moderate sinus pressure headaches, has been taking daily ibuprofen 10-12 tablets each day, has tried different over-the-counter sinus medications without helping her symptoms.  She never tried preventive medications in the past, she complains of excessive stress, her son just moved out from her house to live with her ex-husband, has difficulty sleeping, is taking Wellbutrin 150 mg every morning I have personally reviewed MRI of the brain in 2007, left frontal encephalomalacia  UPDATE Feb 25 2015:She only take one nortirptyline 25mg  qhs, instead of 2 tablets every night, which has been helpful, she no longer has constant headaches, but she has to take ibuprofen up to 5 tablets each time for her right side low back pain, she complains of right hip, low back pain, related to her abnormal gait, she denies shooting pain to her right lower extremity UPDATE 06/12/2015 Cheryl Reeves 45 year old female returns for follow-up. She has been previously followed by Dr. Terrace ArabiaYan.  Records reviewed . She has previous history of stroke and is currently on aspirin with no further stroke or TIA symptoms. She also has a history of seizure disorder with no seizures in many years She also has history of headaches and is currently taking nortriptyline 1 tablet at night with benefit. She continues to have right-sided low back pain probably related to her abnormal gait. She ambulates with a single-point cane and has not had any falls. She returns for reevaluation.   REVIEW OF SYSTEMS: Full 14 system review of systems performed and notable only for those listed, all others are neg:  Constitutional: neg  Cardiovascular: neg Ear/Nose/Throat: neg  Skin: neg Eyes: neg Respiratory: neg Gastroitestinal: neg  Hematology/Lymphatic: neg  Endocrine: neg Musculoskeletal: Back pain walking difficulty Allergy/Immunology: neg Neurological: History of seizure disorder, history of stroke after pregnancy Psychiatric: Anxiety and nervousness Sleep : neg   ALLERGIES: Allergies  Allergen Reactions  . Codeine     headaches    HOME MEDICATIONS: Outpatient Prescriptions Prior to Visit  Medication Sig Dispense Refill  . aspirin 81 MG tablet Take 81 mg by mouth daily.    . baclofen (LIORESAL) 10 MG tablet Take 1 tablet (10 mg total) by mouth 2 (two) times daily. 60 tablet 5  . buPROPion (WELLBUTRIN XL) 150 MG 24 hr tablet Take 1 tablet (150 mg total) by mouth daily. 30 tablet 11  . carbamazepine (CARBATROL) 200 MG 12 hr capsule Take 1 capsule (200 mg total) by mouth 2 (two) times daily. 60 capsule 11  . nortriptyline (PAMELOR) 25 MG capsule One po qhs xone week, then 2 tabs po qhs (Patient taking  differently: Take 25 mg by mouth at bedtime. One po qhs xone week, then 2 tabs po qhs) 60 capsule 6  . ondansetron (ZOFRAN ODT) 4 MG disintegrating tablet Take 1 tablet (4 mg total) by mouth every 8 (eight) hours as needed for nausea or vomiting. 20 tablet 6   No facility-administered medications  prior to visit.    PAST MEDICAL HISTORY: Past Medical History  Diagnosis Date  . GERD (gastroesophageal reflux disease)   . Allergy   . Wears glasses   . Stroke [facility]) 1999    S/p childbirth-weakness rt leg  . DJD (degenerative joint disease)     PAST SURGICAL HISTORY: Past Surgical History  Procedure Laterality Date  . Tubal ligation  2000  . Replacement total knee Right 2009  . Arthroscopy knee w/ drilling  2008    rt  . Sinus endo w/fusion Bilateral 07/21/2013    Procedure: ENDOSCOPIC SINUS SURGERY WITH FUSION NAVIGATION;  Surgeon: Suzanna Obey, MD;  Location: [facility];  Service: ENT;  Laterality: Bilateral;  . Septoplasty Bilateral 07/21/2013    Procedure: SEPTOPLASTY;  Surgeon: Suzanna Obey, MD;  Location: [facility];  Service: ENT;  Laterality: Bilateral;    FAMILY HISTORY: Family History  Problem Relation Age of Onset  . Cancer Mother     lung  . Heart disease Father   . Diabetes    . Arthritis/Rheumatoid      SOCIAL HISTORY: Social History   Social History  . Marital Status: Single    Spouse Name: N/A  . Number of Children: N/A  . Years of Education: N/A   Occupational History  . Not on file.   Social History Main Topics  . Smoking status: Current Every Day Smoker -- 0.50 packs/day    Types: Cigarettes    Last Attempt to Quit: 12/06/2011  . Smokeless tobacco: Never Used  . Alcohol Use: No  . Drug Use: Yes    Special: Marijuana     Comment: occasionally  . Sexual Activity: Not on file   Other Topics Concern  . Not on file   Social History Narrative   Single, 1 child   Right handed   10 th grade   1/2-1 cup daily     PHYSICAL EXAM  Filed Vitals:   06/12/15 0925  BP: 170/108  Pulse: 83  Height: 5\' 6"  (1.676 m)  Weight: 155 lb 9.6 oz (70.58 kg)   Body mass index is 25.13 kg/(m^2).  Generalized: Well developed, in no acute distress  Head: normocephalic and atraumatic,. Oropharynx benign  Neck: Supple, no  carotid bruits  Cardiac: Regular rate rhythm, no murmur  Musculoskeletal: No deformity   Neurological examination   Mentation: Alert oriented to time, place, history taking. Attention span and concentration appropriate. Recent and remote memory intact.  Follows all commands speech with mild expressive aphasia .   Cranial nerve II-XII: Fundoscopic exam reveals sharp disc margins.Pupils were equal round reactive to light extraocular movements were full, visual field were full on confrontational test. Facial sensation and strength were normal. hearing was intact to finger rubbing bilaterally. Uvula tongue midline. head turning and shoulder shrug were normal and symmetric.Tongue protrusion into cheek strength was normal. Motor: Mild right arm proximal and distal weakness, moderate right lower extremity spasticity with proximal and distal weakness . AFO  right lower leg Sensory: normal and symmetric to light touch, pinprick, and  Vibration,   Coordination: finger-nose-finger, heel-to-shin bilaterally, no dysmetria Reflexes: Brachioradialis 2/2, biceps 2/2,  triceps 2/2, patellar 2/2, Achilles 2/2, plantar responses were flexor bilaterally. Gait and Station: Right spastic hemicircumferential gait DIAGNOSTIC DATA (LABS, IMAGING, TESTING) - I reviewed patient records, labs, notes, testing and imaging myself where available.  Lab Results  Component Value Date   WBC 4.7 12/26/2014   HGB 12.9 12/26/2014   HCT 39.5 12/26/2014   MCV 91.5 12/26/2014   PLT 185 06/17/2007      Component Value Date/Time   NA 133* 12/26/2014 0942   NA 137 06/16/2007 0518   K 4.4 12/26/2014 0942   CL 96* 12/26/2014 0942   CO2 23 12/26/2014 0942   GLUCOSE 90 12/26/2014 0942   GLUCOSE 118* 06/16/2007 0518   BUN 6 12/26/2014 0942   BUN 3* 06/16/2007 0518   CREATININE 0.62 12/26/2014 0942   CALCIUM 9.0 12/26/2014 0942   PROT 6.7 12/26/2014 0942   PROT 7.0 06/08/2007 0935   ALBUMIN 4.2 12/26/2014 0942   ALBUMIN 3.8  06/08/2007 0935   AST 12 12/26/2014 0942   ALT 5 12/26/2014 0942   ALKPHOS 75 12/26/2014 0942   BILITOT 0.3 12/26/2014 0942   BILITOT 0.2 10/09/2013 1242   GFRNONAA 110 12/26/2014 0942   GFRAA 127 12/26/2014 0942   Lab Results  Component Value Date   CHOL 179 12/26/2014   HDL 57 12/26/2014   LDLCALC 102* 12/26/2014   TRIG 101 12/26/2014   CHOLHDL 3.1 12/26/2014    Lab Results  Component Value Date   TSH 1.940 12/26/2014      ASSESSMENT AND PLAN  45 y.o. year old female  has a past medical history of left frontal stroke with residual spastic right hemiparesis, unsteady gait right-sided low back pain and chronic headaches. Not a candidate for triptan's and Cambia non-covered by insurance  PLAN: Continue aspirin for secondary stroke prevention Continue nortriptyline for headaches Handicap sticker filled out Follow up in 6-8 months Nilda Riggs, [facility], [facility], APRN  [facility] [address], [address] [place], [place] [address] [phone]

## 2015-06-12 NOTE — Patient Instructions (Signed)
Continue aspirin for secondary stroke prevention Continue nortriptyline for headaches Handicap sticker filled out Follow up in 6-8 months

## 2015-06-12 NOTE — Progress Notes (Signed)
I have reviewed and agreed above plan. 

## 2015-06-19 ENCOUNTER — Encounter: Payer: Self-pay | Admitting: Family Medicine

## 2015-06-19 ENCOUNTER — Ambulatory Visit (INDEPENDENT_AMBULATORY_CARE_PROVIDER_SITE_OTHER): Payer: Medicare Other | Admitting: Family Medicine

## 2015-06-19 VITALS — BP 151/93 | HR 87 | Temp 97.6°F | Ht 66.0 in | Wt 156.0 lb

## 2015-06-19 DIAGNOSIS — R350 Frequency of micturition: Secondary | ICD-10-CM | POA: Diagnosis not present

## 2015-06-19 DIAGNOSIS — N3090 Cystitis, unspecified without hematuria: Secondary | ICD-10-CM | POA: Diagnosis not present

## 2015-06-19 LAB — POCT URINALYSIS DIPSTICK
Bilirubin, UA: NEGATIVE
Glucose, UA: NEGATIVE
Ketones, UA: NEGATIVE
Nitrite, UA: POSITIVE
PH UA: 7.5
PROTEIN UA: NEGATIVE
Spec Grav, UA: 1.01
Urobilinogen, UA: NEGATIVE

## 2015-06-19 LAB — POCT UA - MICROSCOPIC ONLY
CASTS, UR, LPF, POC: NEGATIVE
Crystals, Ur, HPF, POC: NEGATIVE
Yeast, UA: NEGATIVE

## 2015-06-19 MED ORDER — NITROFURANTOIN MONOHYD MACRO 100 MG PO CAPS: 100.0000 mg | ORAL_CAPSULE | Freq: Two times a day (BID) | ORAL | Status: DC

## 2015-06-19 NOTE — Progress Notes (Signed)
Subjective:  Patient ID: Cheryl Reeves, female    DOB: [birth year]  Age: 45 y.o. MRN: [id number]  CC: Urinary Tract Infection   HPI Cheryl Reeves presents for urinary sx for several days. Burning with each urination. Feeling need to go frequently - every hour. History Cheryl Reeves has a past medical history of GERD (gastroesophageal reflux disease); Allergy; Wears glasses; Stroke (HCC) (1999); and DJD (degenerative joint disease).   She has past surgical history that includes Tubal ligation (2000); Replacement total knee (Right, 2009); Arthroscopy knee w/ drilling (2008); Sinus endo w/fusion (Bilateral, 07/21/2013); and Septoplasty (Bilateral, 07/21/2013).   Her family history includes Cancer in her mother; Heart disease in her father.She reports that she has been smoking Cigarettes.  She has been smoking about 0.50 packs per day. She has never used smokeless tobacco. She reports that she uses illicit drugs (Marijuana). She reports that she does not drink alcohol.  Outpatient Prescriptions Prior to Visit  Medication Sig Dispense Refill  . aspirin 81 MG tablet Take 81 mg by mouth daily.    . baclofen (LIORESAL) 10 MG tablet Take 1 tablet (10 mg total) by mouth 2 (two) times daily. 60 tablet 5  . buPROPion (WELLBUTRIN XL) 150 MG 24 hr tablet Take 1 tablet (150 mg total) by mouth daily. 30 tablet 11  . carbamazepine (CARBATROL) 200 MG 12 hr capsule Take 1 capsule (200 mg total) by mouth 2 (two) times daily. 60 capsule 11  . nortriptyline (PAMELOR) 25 MG capsule One po qhs xone week, then 2 tabs po qhs (Patient taking differently: Take 25 mg by mouth at bedtime. One po qhs xone week, then 2 tabs po qhs) 60 capsule 6  . ondansetron (ZOFRAN ODT) 4 MG disintegrating tablet Take 1 tablet (4 mg total) by mouth every 8 (eight) hours as needed for nausea or vomiting. 20 tablet 6   No facility-administered medications prior to visit.    ROS Review of Systems  Constitutional: Negative for fever, chills and  diaphoresis.  HENT: Negative for congestion.   Eyes: Negative for visual disturbance.  Respiratory: Negative for cough and shortness of breath.   Cardiovascular: Negative for chest pain and palpitations.  Gastrointestinal: Negative for nausea, diarrhea and constipation.  Genitourinary: Positive for dysuria, urgency and frequency. Negative for hematuria, flank pain, decreased urine volume, menstrual problem and pelvic pain.  Musculoskeletal: Negative for joint swelling and arthralgias.  Skin: Negative for rash.    Objective:  BP 151/93 mmHg  Pulse 87  Temp(Src) 97.6 F (36.4 C) (Oral)  Ht 5\' 6"  (1.676 m)  Wt 156 lb (70.761 kg)  BMI 25.19 kg/m2  SpO2 100%  LMP 06/09/2015  BP Readings from Last 3 Encounters:  06/19/15 151/93  06/12/15 170/108  04/29/15 153/93    Wt Readings from Last 3 Encounters:  06/19/15 156 lb (70.761 kg)  06/12/15 155 lb 9.6 oz (70.58 kg)  04/29/15 158 lb (71.668 kg)     Physical Exam  Constitutional: She is oriented to person, place, and time. She appears well-developed and well-nourished.  HENT:  Head: Normocephalic and atraumatic.  Cardiovascular: Normal rate and regular rhythm.   No murmur heard. Pulmonary/Chest: Effort normal and breath sounds normal.  Abdominal: Soft. Bowel sounds are normal. She exhibits no mass. There is no tenderness. There is no rebound and no guarding.  Musculoskeletal: She exhibits no tenderness (CVA nontender bilaterally).  Neurological: She is alert and oriented to person, place, and time.  Skin: Skin is warm and dry.  Psychiatric: She has a normal mood and affect. Her behavior is normal.     Lab Results  Component Value Date   WBC 4.7 12/26/2014   HGB 12.9 12/26/2014   HCT 39.5 12/26/2014   PLT 185 06/17/2007   GLUCOSE 90 12/26/2014   CHOL 179 12/26/2014   TRIG 101 12/26/2014   HDL 57 12/26/2014   LDLCALC 102* 12/26/2014   ALT 5 12/26/2014   AST 12 12/26/2014   NA 133* 12/26/2014   K 4.4 12/26/2014    CL 96* 12/26/2014   CREATININE 0.62 12/26/2014   BUN 6 12/26/2014   CO2 23 12/26/2014   TSH 1.940 12/26/2014   INR 1.0 06/08/2007   Results for orders placed or performed in visit on 06/19/15  Urine culture  Result Value Ref Range   Urine Culture, Routine Final report (A)    Urine Culture result 1 Escherichia coli (A)    ANTIMICROBIAL SUSCEPTIBILITY Comment   POCT urinalysis dipstick  Result Value Ref Range   Color, UA yellow    Clarity, UA cloudy    Glucose, UA neg    Bilirubin, UA neg    Ketones, UA neg    Spec Grav, UA 1.010    Blood, UA trace    pH, UA 7.5    Protein, UA neg    Urobilinogen, UA negative    Nitrite, UA pos    Leukocytes, UA Trace (A) Negative  POCT UA - Microscopic Only  Result Value Ref Range   WBC, Ur, HPF, POC 10-12    RBC, urine, microscopic 1-5    Bacteria, U Microscopic few    Mucus, UA occ    Epithelial cells, urine per micros occ    Crystals, Ur, HPF, POC neg    Casts, Ur, LPF, POC neg    Yeast, UA neg      Assessment & Plan:   Cheryl Reeves was seen today for urinary tract infection.  Diagnoses and all orders for this visit:  Urinary frequency -     POCT urinalysis dipstick -     POCT UA - Microscopic Only -     Urine culture  Cystitis  Other orders -     nitrofurantoin, macrocrystal-monohydrate, (MACROBID) 100 MG capsule; Take 1 capsule (100 mg total) by mouth 2 (two) times daily.   I am having Cheryl Reeves start on nitrofurantoin (macrocrystal-monohydrate). I am also having her maintain her aspirin, buPROPion, carbamazepine, baclofen, nortriptyline, and ondansetron.  Meds ordered this encounter  Medications  . nitrofurantoin, macrocrystal-monohydrate, (MACROBID) 100 MG capsule    Sig: Take 1 capsule (100 mg total) by mouth 2 (two) times daily.    Dispense:  14 capsule    Refill:  0     Follow-up: Return if symptoms worsen or fail to improve.  Mechele Claude, M.D.

## 2015-06-21 LAB — URINE CULTURE

## 2015-07-05 ENCOUNTER — Ambulatory Visit (INDEPENDENT_AMBULATORY_CARE_PROVIDER_SITE_OTHER): Payer: Medicare Other | Admitting: Nurse Practitioner

## 2015-07-05 ENCOUNTER — Encounter: Payer: Self-pay | Admitting: Nurse Practitioner

## 2015-07-05 VITALS — BP 153/98 | HR 94 | Temp 97.2°F | Ht 66.0 in | Wt 155.0 lb

## 2015-07-05 DIAGNOSIS — Z205 Contact with and (suspected) exposure to viral hepatitis: Secondary | ICD-10-CM

## 2015-07-05 DIAGNOSIS — F411 Generalized anxiety disorder: Secondary | ICD-10-CM

## 2015-07-05 DIAGNOSIS — R3915 Urgency of urination: Secondary | ICD-10-CM

## 2015-07-05 DIAGNOSIS — I10 Essential (primary) hypertension: Secondary | ICD-10-CM

## 2015-07-05 LAB — POCT UA - MICROSCOPIC ONLY
BACTERIA, U MICROSCOPIC: NEGATIVE
Casts, Ur, LPF, POC: NEGATIVE
Crystals, Ur, HPF, POC: NEGATIVE
MUCUS UA: NEGATIVE
RBC, URINE, MICROSCOPIC: NEGATIVE
Yeast, UA: NEGATIVE

## 2015-07-05 LAB — POCT URINALYSIS DIPSTICK
Bilirubin, UA: NEGATIVE
Blood, UA: NEGATIVE
Glucose, UA: NEGATIVE
Ketones, UA: NEGATIVE
Nitrite, UA: NEGATIVE
PROTEIN UA: NEGATIVE
Spec Grav, UA: 1.01
UROBILINOGEN UA: NEGATIVE
pH, UA: 7.5

## 2015-07-05 MED ORDER — LISINOPRIL 10 MG PO TABS: 10.0000 mg | ORAL_TABLET | Freq: Every day | ORAL | Status: DC

## 2015-07-05 MED ORDER — BUPROPION HCL ER (XL) 300 MG PO TB24: 300.0000 mg | ORAL_TABLET | Freq: Every day | ORAL | Status: DC

## 2015-07-05 NOTE — Patient Instructions (Signed)
Hypertension Hypertension, commonly called high blood pressure, is when the force of blood pumping through your arteries is too strong. Your arteries are the blood vessels that carry blood from your heart throughout your body. A blood pressure reading consists of a higher number over a lower number, such as 110/72. The higher number (systolic) is the pressure inside your arteries when your heart pumps. The lower number (diastolic) is the pressure inside your arteries when your heart relaxes. Ideally you want your blood pressure below 120/80. Hypertension forces your heart to work harder to pump blood. Your arteries may become narrow or stiff. Having untreated or uncontrolled hypertension can cause heart attack, stroke, kidney disease, and other problems. RISK FACTORS Some risk factors for high blood pressure are controllable. Others are not.  Risk factors you cannot control include:   Race. You may be at higher risk if you are African American.  Age. Risk increases with age.  Gender. Men are at higher risk than women before age 45 years. After age 65, women are at higher risk than men. Risk factors you can control include:  Not getting enough exercise or physical activity.  Being overweight.  Getting too much fat, sugar, calories, or salt in your diet.  Drinking too much alcohol. SIGNS AND SYMPTOMS Hypertension does not usually cause signs or symptoms. Extremely high blood pressure (hypertensive crisis) may cause headache, anxiety, shortness of breath, and nosebleed. DIAGNOSIS To check if you have hypertension, your health care provider will measure your blood pressure while you are seated, with your arm held at the level of your heart. It should be measured at least twice using the same arm. Certain conditions can cause a difference in blood pressure between your right and left arms. A blood pressure reading that is higher than normal on one occasion does not mean that you need treatment. If  it is not clear whether you have high blood pressure, you may be asked to return on a different day to have your blood pressure checked again. Or, you may be asked to monitor your blood pressure at home for 1 or more weeks. TREATMENT Treating high blood pressure includes making lifestyle changes and possibly taking medicine. Living a healthy lifestyle can help lower high blood pressure. You may need to change some of your habits. Lifestyle changes may include:  Following the DASH diet. This diet is high in fruits, vegetables, and whole grains. It is low in salt, red meat, and added sugars.  Keep your sodium intake below 2,300 mg per day.  Getting at least 30-45 minutes of aerobic exercise at least 4 times per week.  Losing weight if necessary.  Not smoking.  Limiting alcoholic beverages.  Learning ways to reduce stress. Your health care provider may prescribe medicine if lifestyle changes are not enough to get your blood pressure under control, and if one of the following is true:  You are 18-59 years of age and your systolic blood pressure is above 140.  You are 60 years of age or older, and your systolic blood pressure is above 150.  Your diastolic blood pressure is above 90.  You have diabetes, and your systolic blood pressure is over 140 or your diastolic blood pressure is over 90.  You have kidney disease and your blood pressure is above 140/90.  You have heart disease and your blood pressure is above 140/90. Your personal target blood pressure may vary depending on your medical conditions, your age, and other factors. HOME CARE INSTRUCTIONS    Have your blood pressure rechecked as directed by your health care provider.   Take medicines only as directed by your health care provider. Follow the directions carefully. Blood pressure medicines must be taken as prescribed. The medicine does not work as well when you skip doses. Skipping doses also puts you at risk for  problems.  Do not smoke.   Monitor your blood pressure at home as directed by your health care provider. SEEK MEDICAL CARE IF:   You think you are having a reaction to medicines taken.  You have recurrent headaches or feel dizzy.  You have swelling in your ankles.  You have trouble with your vision. SEEK IMMEDIATE MEDICAL CARE IF:  You develop a severe headache or confusion.  You have unusual weakness, numbness, or feel faint.  You have severe chest or abdominal pain.  You vomit repeatedly.  You have trouble breathing. MAKE SURE YOU:   Understand these instructions.  Will watch your condition.  Will get help right away if you are not doing well or get worse.   This information is not intended to replace advice given to you by your health care provider. Make sure you discuss any questions you have with your health care provider.   Document Released: 05/25/2005 Document Revised: 10/09/2014 Document Reviewed: 03/17/2013 Elsevier Interactive Patient Education 2016 [employer]  

## 2015-07-05 NOTE — Progress Notes (Signed)
Subjective:    Patient ID: Cheryl Reeves, female    DOB: 10-10-1970, 45 y.o.   MRN: [id number]  HPI Patient here with several complains: - Patient went to see neurologist several weeks ago and blood pressure was elevated- 170/108-he wanted her too have it checked. She has not been checking blood pressure at home. Denies headache, chest or SOB. No visual problems. - GAD- has gotten worse- stays anxious- is on wellbutrin currently- feels like she is having panic attacks occasionally. - Partner checked positive for Hep C and she wants to be checked. - had UTI 2 weeks ago- took antibiotic- still has some symptoms  Review of Systems  Constitutional: Negative.   HENT: Negative.   Respiratory: Negative for shortness of breath.   Cardiovascular: Negative for chest pain.  Gastrointestinal: Negative.   Genitourinary: Negative.   Neurological: Negative.   Psychiatric/Behavioral: The patient is nervous/anxious.   All other systems reviewed and are negative.      Objective:   Physical Exam  Constitutional: She is oriented to person, place, and time. She appears well-developed and well-nourished.  Cardiovascular: Normal rate, regular rhythm and normal heart sounds.   Pulmonary/Chest: Effort normal and breath sounds normal.  Neurological: She is alert and oriented to person, place, and time.  Skin: Skin is warm and dry.  Psychiatric: She has a normal mood and affect. Her behavior is normal. Judgment and thought content normal.   GAD 7 : Generalized Anxiety Score 07/05/2015  Nervous, Anxious, on Edge 1  Control/stop worrying 3  Worry too much - different things 3  Trouble relaxing 2  Restless 3  Easily annoyed or irritable 2  Afraid - awful might happen 0  Total GAD 7 Score 14  Anxiety Difficulty Very difficult    BP 153/98 mmHg  Pulse 94  Temp(Src) 97.2 F (36.2 C) (Oral)  Ht  (1.676 m)  Wt 155 lb (70.308 kg)  BMI 25.03 kg/m2  LMP 06/09/2015  Results for orders placed or  performed in visit on 07/05/15  POCT urinalysis dipstick  Result Value Ref Range   Color, UA yellow    Clarity, UA clear    Glucose, UA neg    Bilirubin, UA neg    Ketones, UA neg    Spec Grav, UA 1.010    Blood, UA neg    pH, UA 7.5    Protein, UA neg    Urobilinogen, UA negative    Nitrite, UA neg    Leukocytes, UA moderate (2+) (A) Negative  POCT UA - Microscopic Only  Result Value Ref Range   WBC, Ur, HPF, POC 10-15    RBC, urine, microscopic neg    Bacteria, U Microscopic neg    Mucus, UA neg    Epithelial cells, urine per micros occ    Crystals, Ur, HPF, POC neg    Casts, Ur, LPF, POC neg    Yeast, UA neg           Assessment & Plan:  1. Urinary urgency Force fluids Will wait on culture to treat - POCT urinalysis dipstick - POCT UA - Microscopic Only - Urine culture - Urine culture  2. GAD (generalized anxiety disorder) Stress management - buPROPion (WELLBUTRIN XL) 300 MG 24 hr tablet; Take 1 tablet (300 mg total) by mouth daily.  Dispense: 30 tablet; Refill: 5  3. Exposure to hepatitis C - Hepatitis C antibody  4. Essential hypertension Do not add salt to diet - lisinopril (PRINIVIL,ZESTRIL) 10  MG tablet; Take 1 tablet (10 mg total) by mouth daily.  Dispense: 30 tablet; Refill: 5 -CMP pending  Follow up in 1 month  [name], FNP

## 2015-07-06 ENCOUNTER — Telehealth: Payer: Self-pay | Admitting: Nurse Practitioner

## 2015-07-06 LAB — CMP14+EGFR
ALT: 6 IU/L (ref 0–32)
AST: 14 IU/L (ref 0–40)
Albumin/Globulin Ratio: 1.6 (ref 1.1–2.5)
Albumin: 4.2 g/dL (ref 3.5–5.5)
Alkaline Phosphatase: 90 IU/L (ref 39–117)
BUN/Creatinine Ratio: 9 (ref 9–23)
BUN: 5 mg/dL — ABNORMAL LOW (ref 6–24)
Bilirubin Total: 0.4 mg/dL (ref 0.0–1.2)
CALCIUM: 8.9 mg/dL (ref 8.7–10.2)
CO2: 24 mmol/L (ref 18–29)
CREATININE: 0.58 mg/dL (ref 0.57–1.00)
Chloride: 95 mmol/L — ABNORMAL LOW (ref 96–106)
GFR calc Af Amer: 130 mL/min/{1.73_m2} (ref >59)
GFR, EST NON AFRICAN AMERICAN: 112 mL/min/{1.73_m2} (ref >59)
Globulin, Total: 2.6 g/dL (ref 1.5–4.5)
Glucose: 70 mg/dL (ref 65–99)
Potassium: 4.8 mmol/L (ref 3.5–5.2)
Sodium: 133 mmol/L — ABNORMAL LOW (ref 134–144)
TOTAL PROTEIN: 6.8 g/dL (ref 6.0–8.5)

## 2015-07-06 LAB — HEPATITIS C ANTIBODY: HEP C VIRUS AB: 0.1 {s_co_ratio} (ref 0.0–0.9)

## 2015-07-07 LAB — URINE CULTURE

## 2015-07-08 ENCOUNTER — Other Ambulatory Visit: Payer: Self-pay | Admitting: Nurse Practitioner

## 2015-07-08 MED ORDER — NITROFURANTOIN MONOHYD MACRO 100 MG PO CAPS: 100.0000 mg | ORAL_CAPSULE | Freq: Two times a day (BID) | ORAL | Status: DC

## 2015-07-31 ENCOUNTER — Other Ambulatory Visit: Payer: Self-pay | Admitting: Nurse Practitioner

## 2015-08-02 ENCOUNTER — Other Ambulatory Visit: Payer: Self-pay | Admitting: Nurse Practitioner

## 2015-08-05 ENCOUNTER — Telehealth: Payer: Self-pay | Admitting: Nurse Practitioner

## 2015-08-05 ENCOUNTER — Ambulatory Visit: Payer: Medicare Other | Admitting: Nurse Practitioner

## 2015-08-05 DIAGNOSIS — R6889 Other general symptoms and signs: Secondary | ICD-10-CM | POA: Diagnosis not present

## 2015-08-05 NOTE — Telephone Encounter (Signed)
Advised we don't have any openings and we are completely booked, offered appt tomorrow but pt declined.

## 2015-08-31 ENCOUNTER — Ambulatory Visit (INDEPENDENT_AMBULATORY_CARE_PROVIDER_SITE_OTHER): Payer: Medicare Other | Admitting: Nurse Practitioner

## 2015-08-31 VITALS — BP 145/87 | HR 88 | Temp 98.0°F | Ht 66.0 in | Wt 154.0 lb

## 2015-08-31 DIAGNOSIS — R300 Dysuria: Secondary | ICD-10-CM

## 2015-08-31 MED ORDER — CIPROFLOXACIN HCL 500 MG PO TABS: 500.0000 mg | ORAL_TABLET | Freq: Two times a day (BID) | ORAL | Status: DC

## 2015-08-31 MED ORDER — FLUCONAZOLE 150 MG PO TABS: 150.0000 mg | ORAL_TABLET | Freq: Once | ORAL | Status: DC

## 2015-08-31 NOTE — Addendum Note (Signed)
Addended by: Bennie PieriniMARTIN, MARY-MARGARET on: 08/31/2015 10:12 AM   Modules accepted: Orders, SmartSet

## 2015-08-31 NOTE — Patient Instructions (Signed)

## 2015-08-31 NOTE — Progress Notes (Signed)
  Subjective:    Cheryl Reeves is a 45 y.o. female who complains of burning with urination, dysuria, frequency and urgency. She has had symptoms for 3 days. Patient also complains of back pain. Patient denies fever and vaginal discharge. Patient does have a history of recurrent UTI. Patient does have a history of pyelonephritis.   The following portions of the patient's history were reviewed and updated as appropriate: allergies, current medications, past family history, past medical history, past social history, past surgical history and problem list.  Review of Systems Pertinent items are noted in HPI.    Objective:    BP 145/87 mmHg  Pulse 88  Temp(Src) 98 F (36.7 C) (Oral)  Ht 5\' 6"  (1.676 m)  Wt 154 lb (69.854 kg)  BMI 24.87 kg/m2 General appearance: alert and cooperative Lungs: clear to auscultation bilaterally Heart: regular rate and rhythm, S1, S2 normal, no murmur, click, rub or gallop Abdomen: soft, non-tender; bowel sounds normal; no masses,  no organomegaly  Laboratory:  Urine dipstick: trace for leukocyte esterase.      Assessment:    Acute cystitis     Plan:  Take medication as prescribe Cotton underwear Take shower not bath Cranberry juice, yogurt Force fluids AZO over the counter X2 days Culture pending RTO prn Meds ordered this encounter  Medications  . ciprofloxacin (CIPRO) 500 MG tablet    Sig: Take 1 tablet (500 mg total) by mouth 2 (two) times daily.    Dispense:  20 tablet    Refill:  0    Order Specific Question:  Supervising Provider    Answer:  Ernestina PennaMOORE, Cheryl W [[id number]]   Mary-Margaret Daphine DeutscherMartin, FNP

## 2015-09-01 LAB — URINALYSIS
Bilirubin, UA: NEGATIVE
Glucose, UA: NEGATIVE
Ketones, UA: NEGATIVE
Nitrite, UA: POSITIVE — AB
PH UA: 7 (ref 5.0–7.5)
PROTEIN UA: NEGATIVE
SPEC GRAV UA: 1.008 (ref 1.005–1.030)
Urobilinogen, Ur: 0.2 mg/dL (ref 0.2–1.0)

## 2015-09-02 LAB — URINE CULTURE

## 2015-10-24 ENCOUNTER — Other Ambulatory Visit: Payer: Self-pay | Admitting: Neurology

## 2015-11-21 ENCOUNTER — Other Ambulatory Visit: Payer: Self-pay | Admitting: Nurse Practitioner

## 2015-12-18 ENCOUNTER — Encounter: Payer: Self-pay | Admitting: Nurse Practitioner

## 2015-12-18 ENCOUNTER — Ambulatory Visit (INDEPENDENT_AMBULATORY_CARE_PROVIDER_SITE_OTHER): Payer: Medicare Other | Admitting: Nurse Practitioner

## 2015-12-18 VITALS — BP 128/87 | HR 91 | Temp 97.2°F | Ht 66.0 in | Wt 153.0 lb

## 2015-12-18 DIAGNOSIS — N3000 Acute cystitis without hematuria: Secondary | ICD-10-CM

## 2015-12-18 DIAGNOSIS — L84 Corns and callosities: Secondary | ICD-10-CM | POA: Diagnosis not present

## 2015-12-18 DIAGNOSIS — R300 Dysuria: Secondary | ICD-10-CM

## 2015-12-18 LAB — URINALYSIS, COMPLETE
BILIRUBIN UA: NEGATIVE
GLUCOSE, UA: NEGATIVE
KETONES UA: NEGATIVE
NITRITE UA: NEGATIVE
Protein, UA: NEGATIVE
SPEC GRAV UA: 1.02 (ref 1.005–1.030)
UUROB: 0.2 mg/dL (ref 0.2–1.0)
pH, UA: 7.5 (ref 5.0–7.5)

## 2015-12-18 LAB — MICROSCOPIC EXAMINATION: RBC MICROSCOPIC, UA: NONE SEEN /HPF (ref 0–?)

## 2015-12-18 MED ORDER — CIPROFLOXACIN HCL 500 MG PO TABS: 500.0000 mg | ORAL_TABLET | Freq: Two times a day (BID) | ORAL | Status: DC

## 2015-12-18 NOTE — Patient Instructions (Signed)
Take medication as prescribe Cotton underwear Take shower not bath Cranberry juice, yogurt Force fluids AZO over the counter X2 days Culture pending RTO prn  

## 2015-12-18 NOTE — Progress Notes (Signed)
  Subjective:    Cheryl Reeves is a 45 y.o. female who complains of burning with urination, dysuria, foul smelling urine, frequency and urgency. She has had symptoms for 4 days. Patient also complains of suprapubic pressure. Patient denies back pain and vaginal discharge. Patient does have a history of recurrent UTI. Patient does not have a history of pyelonephritis.   The following portions of the patient's history were reviewed and updated as appropriate: allergies, current medications, past family history, past medical history, past social history, past surgical history and problem list.  * also has a  Callus on  medial side  Right big toe Review of Systems Pertinent items are noted in HPI.    Objective:    BP 128/87 mmHg  Pulse 91  Temp(Src) 97.2 F (36.2 C) (Oral)  Ht 5\' 6"  (1.676 m)  Wt 153 lb (69.4 kg)  BMI 24.71 kg/m2 General appearance: alert and cooperative Lungs: clear to auscultation bilaterally Heart: regular rate and rhythm, S1, S2 normal, no murmur, click, rub or gallop Abdomen: normal findings: bowel sounds normal, liver span normal to percussion, no masses palpable, no organomegaly and mild suprapubic painon palpation Ext: callus formation medial side right big toe Laboratory:  Urine dipstick: 2+ for leukocyte esterase.   Micro exam: 6-11 WBCs per HPF.    Assessment:    Acute cystitis    callus formation right big toe    Plan:  Take medication as prescribe Cotton underwear Take shower not bath Cranberry juice, yogurt Force fluids AZO over the counter X2 days Culture pending RTO prn   Meds ordered this encounter  Medications  . ciprofloxacin (CIPRO) 500 MG tablet    Sig: Take 1 tablet (500 mg total) by mouth 2 (two) times daily.    Dispense:  20 tablet    Refill:  0    Order Specific Question:  Supervising Provider    Answer:  Oswaldo DoneVINCENT, CAROL L [[id number]]   Orders Placed This Encounter  Procedures  . Urinalysis, Complete  . Ambulatory referral to  Podiatry    Referral Priority:  Routine    Referral Type:  Consultation    Referral Reason:  Specialty Services Required    Requested Specialty:  Podiatry    Number of Visits Requested:  1    Mary-Margaret Daphine DeutscherMartin, [place]NP

## 2015-12-19 ENCOUNTER — Other Ambulatory Visit: Payer: Self-pay

## 2015-12-19 DIAGNOSIS — R569 Unspecified convulsions: Secondary | ICD-10-CM

## 2015-12-19 MED ORDER — CARBAMAZEPINE ER 200 MG PO CP12: 200.0000 mg | ORAL_CAPSULE | Freq: Two times a day (BID) | ORAL | Status: DC

## 2015-12-20 ENCOUNTER — Other Ambulatory Visit: Payer: Self-pay | Admitting: Nurse Practitioner

## 2015-12-24 ENCOUNTER — Telehealth: Payer: Self-pay | Admitting: Nurse Practitioner

## 2015-12-24 MED ORDER — PREDNISONE 20 MG PO TABS
ORAL_TABLET | ORAL | Status: DC
Start: 2015-12-24 — End: 2016-02-11

## 2015-12-24 NOTE — Telephone Encounter (Signed)
Prednisone rx sent to pharmacy.

## 2015-12-26 ENCOUNTER — Other Ambulatory Visit: Payer: Self-pay

## 2015-12-26 DIAGNOSIS — F411 Generalized anxiety disorder: Secondary | ICD-10-CM

## 2015-12-26 MED ORDER — BUPROPION HCL ER (XL) 300 MG PO TB24: 300.0000 mg | ORAL_TABLET | Freq: Every day | ORAL | Status: DC

## 2015-12-28 ENCOUNTER — Other Ambulatory Visit: Payer: Self-pay | Admitting: Nurse Practitioner

## 2015-12-30 ENCOUNTER — Other Ambulatory Visit: Payer: Self-pay | Admitting: Nurse Practitioner

## 2015-12-30 ENCOUNTER — Telehealth: Payer: Self-pay | Admitting: Nurse Practitioner

## 2015-12-30 DIAGNOSIS — M79674 Pain in right toe(s): Secondary | ICD-10-CM

## 2015-12-30 NOTE — Telephone Encounter (Signed)
Referral made 

## 2015-12-30 NOTE — Telephone Encounter (Signed)
Patient is requesting a referral to the foot doctor for right great toe pain. Please advise

## 2015-12-30 NOTE — Telephone Encounter (Signed)
Pt aware.

## 2016-02-11 ENCOUNTER — Encounter: Payer: Self-pay | Admitting: Nurse Practitioner

## 2016-02-11 ENCOUNTER — Ambulatory Visit (INDEPENDENT_AMBULATORY_CARE_PROVIDER_SITE_OTHER): Payer: Medicare Other | Admitting: Nurse Practitioner

## 2016-02-11 VITALS — BP 119/75 | HR 84 | Ht 66.0 in | Wt 149.4 lb

## 2016-02-11 DIAGNOSIS — M21371 Foot drop, right foot: Secondary | ICD-10-CM | POA: Diagnosis not present

## 2016-02-11 DIAGNOSIS — Z8673 Personal history of transient ischemic attack (TIA), and cerebral infarction without residual deficits: Secondary | ICD-10-CM

## 2016-02-11 DIAGNOSIS — R569 Unspecified convulsions: Secondary | ICD-10-CM

## 2016-02-11 DIAGNOSIS — G43909 Migraine, unspecified, not intractable, without status migrainosus: Secondary | ICD-10-CM

## 2016-02-11 MED ORDER — NORTRIPTYLINE HCL 25 MG PO CAPS: 75.0000 mg | ORAL_CAPSULE | Freq: Every day | ORAL | 8 refills | Status: DC

## 2016-02-11 NOTE — Patient Instructions (Addendum)
Increase nortriptyline to 3 tablets at night for headaches  Continue Carbatrol at current dose labs monitored by primary care Continue aspirin for secondary stroke prevention Limit ibuprofen as it causes rebound Follow up in 8 months next with Dr. Terrace ArabiaYan

## 2016-02-11 NOTE — Progress Notes (Signed)
I have reviewed and agreed above plan. 

## 2016-02-11 NOTE — Progress Notes (Signed)
[facility]  PATIENT: Cheryl Reeves DOB: [birth year]   REASON FOR VISIT: follow up for hx of stroke, chronic migraine, spastic hemiplegia, right-sided low back pain, history of seizure disorder HISTORY FROM: Patient    HISTORY OF PRESENT ILLNESS: HISTORY: Cheryl Reeves is a 45 years old right-handed female, seen in refer by her primary care physician Dr. Rudi Reeves for evaluation of frequent headaches, last visit was with Dr. Hosie Reeves in 2015  She suffered stroke in 1999, was attributed to post partum hemorrhage, with mild aphasia, mild right upper extremity weakness, spastic gait right lower extremity weakness, she also had one seizure when she had a stroke, recurrent seizure in 2001, taking Tegretol 200 mg twice a day  She has frequent headaches even before she had stroke, contributed her sinus headache, had severe headache the day she had stroke, and persistent almost daily headache ever since, since 2014, she has moderate sinus pressure headaches, has been taking daily ibuprofen 10-12 tablets each day, has tried different over-the-counter sinus medications without helping her symptoms.  She never tried preventive medications in the past, she complains of excessive stress, her son just moved out from her house to live with her ex-husband, has difficulty sleeping, is taking Wellbutrin 150 mg every morning I have personally reviewed MRI of the brain in 2007, left frontal encephalomalacia  UPDATE Feb 25 2015:She only take one nortirptyline 25mg  qhs, instead of 2 tablets every night, which has been helpful, she no longer has constant headaches, but she has to take ibuprofen up to 5 tablets each time for her right side low back pain, she complains of right hip, low back pain, related to her abnormal gait, she denies shooting pain to her right lower extremity UPDATE 01/04/2017[name] returns for follow-up. She has been previously followed by Dr. Terrace Reeves.  Records reviewed . She has previous history of stroke and is currently on aspirin with no further stroke or TIA symptoms. She also has a history of seizure disorder with no seizures in many years She also has history of headaches and is currently taking nortriptyline 1 tablet at night with benefit. She continues to have right-sided low back pain probably related to her abnormal gait. She ambulates with a single-point cane and has not had any falls. She returns for reevaluation.  UPDATE 02/11/16 CM Cheryl Reeves, 45 year old female returns for follow-up. She has previous history of stroke  on aspirin for secondary stroke prevention without further stroke or TIA symptoms. She also has a history of seizure disorder with no seizure in many years. Her primary care orders her carbamazepine with her labs. She also has a history of headaches and is currently on nortriptyline 50 mg at night. She continues to have about 5-10 headaches per month she states. In addition she has some right-sided low back pain related to her abnormal gait she ambulates with a single-point cane no recent falls. She has a AFO to right lower leg. She returns for reevaluation REVIEW OF SYSTEMS: Full 14 system review of systems performed and notable only for those listed, all others are neg:  Constitutional: neg  Cardiovascular: neg Ear/Nose/Throat: neg  Skin: neg Eyes: neg Respiratory: neg Gastroitestinal: neg  Hematology/Lymphatic: Easy bruising  Endocrine: neg Musculoskeletal:  walking difficulty Allergy/Immunology: neg Neurological: History of seizure disorder, history of stroke after pregnancy Psychiatric:  Sleep : neg   ALLERGIES: Allergies  Allergen Reactions  . Codeine     headaches    HOME MEDICATIONS:  Outpatient Medications Prior to Visit  Medication Sig Dispense Refill  . aspirin 81 MG tablet Take 81 mg by mouth daily.    . baclofen (LIORESAL) 10 MG tablet TAKE 1 TABLET (10 MG TOTAL) BY MOUTH 2 (TWO) TIMES DAILY. 60  tablet 1  . buPROPion (WELLBUTRIN XL) 300 MG 24 hr tablet Take 1 tablet (300 mg total) by mouth daily. 30 tablet 2  . carbamazepine (CARBATROL) 200 MG 12 hr capsule Take 1 capsule (200 mg total) by mouth 2 (two) times daily. 60 capsule 5  . lisinopril (PRINIVIL,ZESTRIL) 10 MG tablet TAKE 1 TABLET (10 MG TOTAL) BY MOUTH DAILY. 30 tablet 5  . nortriptyline (PAMELOR) 25 MG capsule TAKE 1 CAPSULES AT BEDTIME FOR 1 WEEK THEN 2 CAPSULES AT BEDTIME (Patient taking differently: 2 CAPSULES AT BEDTIME) 60 capsule 6  . baclofen (LIORESAL) 10 MG tablet TAKE 1 TABLET (10 MG TOTAL) BY MOUTH 2 (TWO) TIMES DAILY. 60 tablet 1  . ciprofloxacin (CIPRO) 500 MG tablet Take 1 tablet (500 mg total) by mouth 2 (two) times daily. 20 tablet 0  . predniSONE (DELTASONE) 20 MG tablet 2 po at sametime daily for 5 days 10 tablet 0   No facility-administered medications prior to visit.     PAST MEDICAL HISTORY: Past Medical History:  Diagnosis Date  . Allergy   . DJD (degenerative joint disease)   . GERD (gastroesophageal reflux disease)   . Stroke [facility]) 1999   S/p childbirth-weakness rt leg  . Wears glasses     PAST SURGICAL HISTORY: Past Surgical History:  Procedure Laterality Date  . ARTHROSCOPY KNEE W/ DRILLING  2008   rt  . REPLACEMENT TOTAL KNEE Right 2009  . SEPTOPLASTY Bilateral 07/21/2013   Procedure: SEPTOPLASTY;  Surgeon: Suzanna Obey, MD;  Location: [facility];  Service: ENT;  Laterality: Bilateral;  . SINUS ENDO W/FUSION Bilateral 07/21/2013   Procedure: ENDOSCOPIC SINUS SURGERY WITH FUSION NAVIGATION;  Surgeon: Suzanna Obey, MD;  Location: [facility];  Service: ENT;  Laterality: Bilateral;  . TUBAL LIGATION  2000    FAMILY HISTORY: Family History  Problem Relation Age of Onset  . Cancer Mother     lung  . Heart disease Father   . Diabetes    . Arthritis/Rheumatoid      SOCIAL HISTORY: Social History   Social History  . Marital status: Single    Spouse name:  N/A  . Number of children: N/A  . Years of education: N/A   Occupational History  . Not on file.   Social History Main Topics  . Smoking status: Current Every Day Smoker    Packs/day: 0.50    Types: Cigarettes    Last attempt to quit: 12/06/2011  . Smokeless tobacco: Never Used  . Alcohol use No  . Drug use:     Types: Marijuana     Comment: occasionally  . Sexual activity: Not on file   Other Topics Concern  . Not on file   Social History Narrative   Single, 1 child   Right handed   10 th grade   1/2-1 cup daily     PHYSICAL EXAM  Vitals:   02/11/16 0924  BP: 119/75  Pulse: 84  Weight: 149 lb 6.4 oz (67.8 kg)  Height: 5\' 6"  (1.676 m)   Body mass index is 24.11 kg/m.  Generalized: Well developed, in no acute distress  Head: normocephalic and atraumatic,. Oropharynx benign  Neck: Supple, no carotid bruits  Cardiac:  Regular rate rhythm, no murmur  Musculoskeletal: No deformity   Neurological examination   Mentation: Alert oriented to time, place, history taking. Attention span and concentration appropriate. Recent and remote memory intact.  Follows all commands speech with mild expressive aphasia .   Cranial nerve II-XII: .Pupils were equal round reactive to light extraocular movements were full, visual field were full on confrontational test. Facial sensation and strength were normal. hearing was intact to finger rubbing bilaterally. Uvula tongue midline. head turning and shoulder shrug were normal and symmetric.Tongue protrusion into cheek strength was normal. Motor: Mild right arm proximal and distal weakness, moderate right lower extremity spasticity with proximal and distal weakness . AFO  right lower leg Sensory: normal and symmetric to light touch, pinprick, and  Vibration,   Coordination: finger-nose-finger, heel-to-shin bilaterally, no dysmetria Reflexes: Brachioradialis 2/2, biceps 2/2, triceps 2/2, patellar 2/2, Achilles 2/2, plantar responses were  flexor bilaterally. Gait and Station: Right spastic hemicircumferential gait with cane DIAGNOSTIC DATA (LABS, IMAGING, TESTING) - I reviewed patient records, labs, notes, testing and imaging myself where available.  Lab Results  Component Value Date   WBC 4.7 12/26/2014   HGB 12.9 12/26/2014   HCT 39.5 12/26/2014   MCV 91.5 12/26/2014   PLT 185 06/17/2007      Component Value Date/Time   NA 133 (L) 07/05/2015 1116   K 4.8 07/05/2015 1116   CL 95 (L) 07/05/2015 1116   CO2 24 07/05/2015 1116   GLUCOSE 70 07/05/2015 1116   GLUCOSE 118 (H) 06/16/2007 0518   BUN 5 (L) 07/05/2015 1116   CREATININE 0.58 07/05/2015 1116   CALCIUM 8.9 07/05/2015 1116   PROT 6.8 07/05/2015 1116   ALBUMIN 4.2 07/05/2015 1116   AST 14 07/05/2015 1116   ALT 6 07/05/2015 1116   ALKPHOS 90 07/05/2015 1116   BILITOT 0.4 07/05/2015 1116   GFRNONAA 112 07/05/2015 1116   GFRAA 130 07/05/2015 1116   Lab Results  Component Value Date   CHOL 179 12/26/2014   HDL 57 12/26/2014   LDLCALC 102 (H) 12/26/2014   TRIG 101 12/26/2014   CHOLHDL 3.1 12/26/2014    Lab Results  Component Value Date   TSH 1.940 12/26/2014      ASSESSMENT AND PLAN  45 y.o. year old female  has a past medical history of left frontal stroke with residual spastic right hemiparesis, unsteady gait right-sided low back pain and chronic headaches. Not a candidate for triptan's and Cambia non-covered by insurance.  PLAN: Increase nortriptyline to 3 tablets at night for headaches  Continue Carbatrol at current dose labs monitored by primary care Continue aspirin for secondary stroke prevention Limit ibuprofen as it causes rebound Follow up in 8 months Nilda RiggsNancy Carolyn Martin, [facility] JamestownGNP, [facility]uaBC, APRN  [facility] [address], [address] [place], [place] [address] [phone]

## 2016-02-13 DIAGNOSIS — M2140 Flat foot [pes planus] (acquired), unspecified foot: Secondary | ICD-10-CM | POA: Diagnosis not present

## 2016-02-13 DIAGNOSIS — M79671 Pain in right foot: Secondary | ICD-10-CM | POA: Diagnosis not present

## 2016-02-27 ENCOUNTER — Encounter: Payer: Self-pay | Admitting: Nurse Practitioner

## 2016-02-27 ENCOUNTER — Ambulatory Visit (INDEPENDENT_AMBULATORY_CARE_PROVIDER_SITE_OTHER): Payer: Medicare Other | Admitting: Nurse Practitioner

## 2016-02-27 VITALS — BP 126/88 | HR 81 | Temp 97.0°F | Ht 66.0 in | Wt 157.0 lb

## 2016-02-27 DIAGNOSIS — J0101 Acute recurrent maxillary sinusitis: Secondary | ICD-10-CM | POA: Diagnosis not present

## 2016-02-27 MED ORDER — PREDNISONE 10 MG (21) PO TBPK: ORAL_TABLET | ORAL | 0 refills | Status: DC

## 2016-02-27 MED ORDER — AMOXICILLIN-POT CLAVULANATE 875-125 MG PO TABS: 1.0000 | ORAL_TABLET | Freq: Two times a day (BID) | ORAL | 0 refills | Status: AC

## 2016-02-27 NOTE — Patient Instructions (Signed)

## 2016-02-27 NOTE — Progress Notes (Signed)
Subjective:     Cheryl T Swazil[place]Jordan is a 45 y.o. female who presents for evaluation of sinus pain. Symptoms include: congestion, facial pain, headaches, nasal congestion, sinus pressure and sore throat. Onset of symptoms was 2 weeks ago. Symptoms have been gradually worsening since that time. Past history is significant for occasional episodes of bronchitis. Patient is a smoker  (1 ppd x 30 yrs).  The following portions of the patient's history were reviewed and updated as appropriate: allergies, current medications, past family history, past medical history, past social history, past surgical history and problem list.  Review of Systems Pertinent items noted in HPI and remainder of comprehensive ROS otherwise negative.   Objective:    BP 126/88   Pulse 81   Temp 97 F (36.1 C) (Oral)   Ht 5\' 6"  (1.676 m)   Wt 157 lb (71.2 kg)   BMI 25.34 kg/m  General appearance: alert and cooperative Eyes: conjunctivae/corneas clear. PERRL, EOM's intact. Fundi benign. Ears: normal TM's and external ear canals both ears Nose: clear discharge, moderate congestion, turbinates red, sinus tenderness bilateral Throat: lips, mucosa, and tongue normal; teeth and gums normal Neck: no adenopathy, no carotid bruit, no JVD, supple, symmetrical, trachea midline and thyroid not enlarged, symmetric, no tenderness/mass/nodules Lungs: clear to auscultation bilaterally Heart: regular rate and rhythm, S1, S2 normal, no murmur, click, rub or gallop    Assessment:    Acute bacterial sinusitis.    Plan:  1. Take meds as prescribed 2. Use a cool mist humidifier especially during the winter months and when heat has been humid. 3. Use saline nose sprays frequently 4. Saline irrigations of the nose can be very helpful if done frequently.  * 4X daily for 1 week*  * Use of a nettie pot can be helpful with this. Follow directions with this* 5. Drink plenty of fluids 6. Keep thermostat turn down low 7.For any cough or  congestion  Use plain Mucinex- regular strength or max strength is fine   * Children- consult with Pharmacist for dosing 8. For fever or aces or pains- take tylenol or ibuprofen appropriate for age and weight.  * for fevers greater than 101 orally you may alternate ibuprofen and tylenol every  3 hours.   Meds ordered this encounter  Medications  . amoxicillin-clavulanate (AUGMENTIN) 875-125 MG tablet    Sig: Take 1 tablet by mouth 2 (two) times daily.    Dispense:  20 tablet    Refill:  0    Order Specific Question:   Supervising Provider    Answer:   VINCENT, CAROL L [[id number]]  . predniSONE (STERAPRED UNI-PAK 21 TAB) 10 MG (21) TBPK tablet    Sig: As directed x 6 days    Dispense:  21 tablet    Refill:  0    Order Specific Question:   Supervising Provider    Answer:   Johna SheriffVINCENT, CAROL L [[id number]]   Mary-Margaret Daphine DeutscherMartin, FNP

## 2016-03-08 ENCOUNTER — Other Ambulatory Visit: Payer: Self-pay | Admitting: Nurse Practitioner

## 2016-03-21 ENCOUNTER — Other Ambulatory Visit: Payer: Self-pay | Admitting: Nurse Practitioner

## 2016-03-21 DIAGNOSIS — F411 Generalized anxiety disorder: Secondary | ICD-10-CM

## 2016-04-03 ENCOUNTER — Telehealth: Payer: Self-pay | Admitting: Nurse Practitioner

## 2016-04-03 NOTE — Telephone Encounter (Signed)
Patient was offered an appointment for today but said if the UTI got any worse she might come in Saturday morning.

## 2016-04-06 ENCOUNTER — Encounter: Payer: Self-pay | Admitting: Nurse Practitioner

## 2016-04-06 ENCOUNTER — Ambulatory Visit (INDEPENDENT_AMBULATORY_CARE_PROVIDER_SITE_OTHER): Payer: Medicare Other | Admitting: Nurse Practitioner

## 2016-04-06 VITALS — BP 124/86 | HR 99 | Temp 97.8°F | Ht 66.0 in | Wt 154.0 lb

## 2016-04-06 DIAGNOSIS — N3001 Acute cystitis with hematuria: Secondary | ICD-10-CM

## 2016-04-06 DIAGNOSIS — R300 Dysuria: Secondary | ICD-10-CM | POA: Diagnosis not present

## 2016-04-06 LAB — URINALYSIS, COMPLETE
BILIRUBIN UA: NEGATIVE
GLUCOSE, UA: NEGATIVE
KETONES UA: NEGATIVE
Nitrite, UA: NEGATIVE
Protein, UA: NEGATIVE
SPEC GRAV UA: 1.015 (ref 1.005–1.030)
Urobilinogen, Ur: 0.2 mg/dL (ref 0.2–1.0)
pH, UA: 7 (ref 5.0–7.5)

## 2016-04-06 LAB — MICROSCOPIC EXAMINATION

## 2016-04-06 MED ORDER — CIPROFLOXACIN HCL 500 MG PO TABS: 500.0000 mg | ORAL_TABLET | Freq: Two times a day (BID) | ORAL | 0 refills | Status: DC

## 2016-04-06 NOTE — Patient Instructions (Addendum)
Take medication as prescribe Cotton underwear Take shower not bath Cranberry juice, yogurt Force fluids AZO over the counter X2 days Culture pending RTO prn  

## 2016-04-06 NOTE — Progress Notes (Signed)
Subjective:    Cheryl Reeves is a 45 y.o. female who complains of abnormal smelling urine, burning with urination, dysuria, foul smelling urine, hematuria, incomplete bladder emptying and suprapubic pressure. She has had symptoms for 4 days. Patient also complains of nothing else. Patient denies back pain. Patient does have a history of recurrent UTI. Patient does not have a history of pyelonephritis.   The following portions of the patient's history were reviewed and updated as appropriate: allergies, current medications, past family history, past medical history, past social history, past surgical history and problem list.  Review of Systems Pertinent items noted in HPI and remainder of comprehensive ROS otherwise negative.    Objective:    BP 124/86   Pulse 99   Temp 97.8 F (36.6 C) (Oral)   Ht 5\' 6"  (1.676 m)   Wt 154 lb (69.9 kg)   BMI 24.86 kg/m  General appearance: alert and cooperative Lungs: clear to auscultation bilaterally Heart: regular rate and rhythm, S1, S2 normal, no murmur, click, rub or gallop Abdomen: soft, non-tender; bowel sounds normal; no masses,  no organomegaly  Laboratory:  Urine dipstick: 4+ for leukocyte esterase.   Micro exam: 10-20 WBCs per HPF and TNTC RBCs per HPF.    Assessment:    Acute cystitis and UTI     Plan:      Take medication as prescribe Cotton underwear Take shower not bath Cranberry juice, yogurt Force fluids AZO over the counter X2 days Culture pending RTO prn  Meds ordered this encounter  Medications  . ciprofloxacin (CIPRO) 500 MG tablet    Sig: Take 1 tablet (500 mg total) by mouth 2 (two) times daily.    Dispense:  10 tablet    Refill:  0    Order Specific Question:   Supervising Provider    Answer:   Johna SheriffVINCENT, CAROL L [[id number]]   Mary-Margaret Daphine DeutscherMartin, FNP

## 2016-04-08 LAB — URINE CULTURE

## 2016-05-26 ENCOUNTER — Other Ambulatory Visit: Payer: Self-pay | Admitting: Nurse Practitioner

## 2016-06-22 ENCOUNTER — Ambulatory Visit (INDEPENDENT_AMBULATORY_CARE_PROVIDER_SITE_OTHER): Payer: Medicare Other | Admitting: Pediatrics

## 2016-06-22 VITALS — BP 109/77 | HR 84 | Temp 97.4°F | Ht 66.0 in | Wt 154.0 lb

## 2016-06-22 DIAGNOSIS — H65113 Acute and subacute allergic otitis media (mucoid) (sanguinous) (serous), bilateral: Secondary | ICD-10-CM | POA: Diagnosis not present

## 2016-06-22 DIAGNOSIS — J019 Acute sinusitis, unspecified: Secondary | ICD-10-CM | POA: Diagnosis not present

## 2016-06-22 DIAGNOSIS — R399 Unspecified symptoms and signs involving the genitourinary system: Secondary | ICD-10-CM | POA: Diagnosis not present

## 2016-06-22 LAB — URINALYSIS, COMPLETE
Bilirubin, UA: NEGATIVE
GLUCOSE, UA: NEGATIVE
KETONES UA: NEGATIVE
Leukocytes, UA: NEGATIVE
Nitrite, UA: NEGATIVE
PROTEIN UA: NEGATIVE
SPEC GRAV UA: 1.01 (ref 1.005–1.030)
UUROB: 0.2 mg/dL (ref 0.2–1.0)
pH, UA: 7 (ref 5.0–7.5)

## 2016-06-22 LAB — MICROSCOPIC EXAMINATION
Epithelial Cells (non renal): >10 /hpf — AB (ref 0–10)
Renal Epithel, UA: NONE SEEN /hpf

## 2016-06-22 MED ORDER — FLUTICASONE PROPIONATE 50 MCG/ACT NA SUSP: 2.0000 | Freq: Every day | NASAL | 6 refills | Status: DC

## 2016-06-22 MED ORDER — AMOXICILLIN-POT CLAVULANATE 875-125 MG PO TABS: 1.0000 | ORAL_TABLET | Freq: Two times a day (BID) | ORAL | 0 refills | Status: DC

## 2016-06-22 NOTE — Progress Notes (Signed)
Subjective:   Patient ID: Cheryl Reeves, female    DOB: [birth year], 46 y.o.   MRN: [id number] CC: Nasal Congestion; Cough; Sinus Pressure; Urinary Urgency; and Burning with urination  HPI: Cheryl Reeves is a 45 y.o. female presenting for Nasal Congestion; Cough; Sinus Pressure; Urinary Urgency; and Burning with urination  Has pressure in her head, in sinuses Some cough Coughing some things up Feeling about the same for the past week Having cold chills, not for the past two days Using alkaseltzer plus, helping some  Urinary urgency, frequency, dysuria Started yesterday Azo and cranberry juice yesterday  Past Medical History:  Diagnosis Date  . Allergy   . DJD (degenerative joint disease)   . GERD (gastroesophageal reflux disease)   . Stroke [facility]) 1999   S/p childbirth-weakness rt leg  . Wears glasses    Family History  Problem Relation Age of Onset  . Cancer Mother     lung  . Heart disease Father   . Diabetes    . Arthritis/Rheumatoid     Social History   Social History  . Marital status: Single    Spouse name: N/A  . Number of children: N/A  . Years of education: N/A   Social History Main Topics  . Smoking status: Current Every Day Smoker    Packs/day: 0.50    Types: Cigarettes    Last attempt to quit: 12/06/2011  . Smokeless tobacco: Never Used  . Alcohol use No  . Drug use:     Types: Marijuana     Comment: occasionally  . Sexual activity: Not on file   Other Topics Concern  . Not on file   Social History Narrative   Single, 1 child   Right handed   10 th grade   1/2-1 cup daily   ROS: All systems negative other than what is in HPI  Objective:    BP 109/77   Pulse 84   Temp 97.4 F (36.3 C) (Oral)   Ht 5\' 6"  (1.676 m)   Wt 154 lb (69.9 kg)   BMI 24.86 kg/m   Wt Readings from Last 3 Encounters:  06/22/16 154 lb (69.9 kg)  04/06/16 154 lb (69.9 kg)  02/27/16 157 lb (71.2 kg)    Gen: NAD, alert, cooperative with exam, NCAT EYES: EOMI,  no conjunctival injection, or no icterus ENT:  TMs dull with white fluid behind both, OP without erythema, + TTP over max sinuses b/l LYMPH: no cervical LAD CV: NRRR, normal S1/S2, no murmur, distal pulses 2+ b/l Resp: CTABL, no wheezes, normal WOB Ext: No edema, warm Neuro: Alert and oriented MSK: normal muscle bulk  Assessment & Plan:  Cheryl Reeves was seen today for nasal congestion, cough, sinus pressure, urinary urgency and burning with urination.  Diagnoses and all orders for this visit:  UTI symptoms Started two days ago Has some blood in urine Send for culture Starting abx as below for AOM -     Urinalysis, Complete -     Urine culture  Acute sinusitis, recurrence not specified, unspecified location Discussed symptomatic care, sinus rinses -     fluticasone (FLONASE) 50 MCG/ACT nasal spray; Place 2 sprays into both nostrils daily.  Acute mucoid otitis media of both ears -     fluticasone (FLONASE) 50 MCG/ACT nasal spray; Place 2 sprays into both nostrils daily. -     amoxicillin-clavulanate (AUGMENTIN) 875-125 MG tablet; Take 1 tablet by mouth 2 (two) times daily.   Follow up plan:  Return if symptoms worsen or fail to improve. Rex Krasarol Othel Hoogendoorn, MD [facility]

## 2016-06-22 NOTE — Patient Instructions (Signed)
Netipot with distilled water 2-3 times a day to clear out sinuses Or Normal saline nasal spray Flonase steroid nasal spray every day Antihistamine daily such as cetirizine Lots of fluids

## 2016-06-24 ENCOUNTER — Other Ambulatory Visit: Payer: Self-pay | Admitting: Nurse Practitioner

## 2016-06-26 LAB — URINE CULTURE

## 2016-07-02 ENCOUNTER — Other Ambulatory Visit: Payer: Self-pay | Admitting: Nurse Practitioner

## 2016-07-02 DIAGNOSIS — F411 Generalized anxiety disorder: Secondary | ICD-10-CM

## 2016-07-02 DIAGNOSIS — I10 Essential (primary) hypertension: Secondary | ICD-10-CM

## 2016-07-29 ENCOUNTER — Telehealth: Payer: Self-pay | Admitting: Nurse Practitioner

## 2016-07-30 ENCOUNTER — Other Ambulatory Visit: Payer: Self-pay | Admitting: Nurse Practitioner

## 2016-07-30 DIAGNOSIS — R569 Unspecified convulsions: Secondary | ICD-10-CM

## 2016-08-03 NOTE — Telephone Encounter (Signed)
Scheduled

## 2016-09-02 ENCOUNTER — Other Ambulatory Visit: Payer: Self-pay

## 2016-09-02 DIAGNOSIS — F411 Generalized anxiety disorder: Secondary | ICD-10-CM

## 2016-09-02 MED ORDER — BUPROPION HCL ER (XL) 300 MG PO TB24: 300.0000 mg | ORAL_TABLET | Freq: Every day | ORAL | 0 refills | Status: DC

## 2016-09-03 ENCOUNTER — Other Ambulatory Visit: Payer: Self-pay | Admitting: *Deleted

## 2016-09-03 MED ORDER — NORTRIPTYLINE HCL 25 MG PO CAPS: 75.0000 mg | ORAL_CAPSULE | Freq: Every day | ORAL | 0 refills | Status: DC

## 2016-09-10 ENCOUNTER — Encounter: Payer: Self-pay | Admitting: Nurse Practitioner

## 2016-09-10 ENCOUNTER — Ambulatory Visit (INDEPENDENT_AMBULATORY_CARE_PROVIDER_SITE_OTHER): Payer: Medicare Other | Admitting: Nurse Practitioner

## 2016-09-10 VITALS — BP 114/73 | HR 93 | Temp 97.3°F | Ht 66.0 in | Wt 152.0 lb

## 2016-09-10 DIAGNOSIS — G43909 Migraine, unspecified, not intractable, without status migrainosus: Secondary | ICD-10-CM

## 2016-09-10 DIAGNOSIS — F411 Generalized anxiety disorder: Secondary | ICD-10-CM | POA: Diagnosis not present

## 2016-09-10 DIAGNOSIS — R569 Unspecified convulsions: Secondary | ICD-10-CM | POA: Diagnosis not present

## 2016-09-10 DIAGNOSIS — M21371 Foot drop, right foot: Secondary | ICD-10-CM | POA: Diagnosis not present

## 2016-09-10 DIAGNOSIS — R200 Anesthesia of skin: Secondary | ICD-10-CM

## 2016-09-10 DIAGNOSIS — I10 Essential (primary) hypertension: Secondary | ICD-10-CM | POA: Diagnosis not present

## 2016-09-10 DIAGNOSIS — I6930 Unspecified sequelae of cerebral infarction: Secondary | ICD-10-CM | POA: Diagnosis not present

## 2016-09-10 DIAGNOSIS — R202 Paresthesia of skin: Secondary | ICD-10-CM

## 2016-09-10 DIAGNOSIS — K219 Gastro-esophageal reflux disease without esophagitis: Secondary | ICD-10-CM | POA: Diagnosis not present

## 2016-09-10 MED ORDER — NORTRIPTYLINE HCL 25 MG PO CAPS: 75.0000 mg | ORAL_CAPSULE | Freq: Every day | ORAL | 0 refills | Status: DC

## 2016-09-10 MED ORDER — CARBAMAZEPINE ER 200 MG PO CP12: 200.0000 mg | ORAL_CAPSULE | Freq: Two times a day (BID) | ORAL | 5 refills | Status: DC

## 2016-09-10 MED ORDER — BACLOFEN 10 MG PO TABS: ORAL_TABLET | ORAL | 5 refills | Status: DC

## 2016-09-10 MED ORDER — LISINOPRIL 10 MG PO TABS: ORAL_TABLET | ORAL | 5 refills | Status: DC

## 2016-09-10 MED ORDER — BUPROPION HCL ER (XL) 300 MG PO TB24: 300.0000 mg | ORAL_TABLET | Freq: Every day | ORAL | 0 refills | Status: DC

## 2016-09-10 NOTE — Patient Instructions (Signed)
Fall Prevention in the Home Falls can cause injuries. They can happen to people of all ages. There are many things you can do to make your home safe and to help prevent falls. What can I do on the outside of my home?  Regularly fix the edges of walkways and driveways and fix any cracks.  Remove anything that might make you trip as you walk through a door, such as a raised step or threshold.  Trim any bushes or trees on the path to your home.  Use bright outdoor lighting.  Clear any walking paths of anything that might make someone trip, such as rocks or tools.  Regularly check to see if handrails are loose or broken. Make sure that both sides of any steps have handrails.  Any raised decks and porches should have guardrails on the edges.  Have any leaves, snow, or ice cleared regularly.  Use sand or salt on walking paths during winter.  Clean up any spills in your garage right away. This includes oil or grease spills. What can I do in the bathroom?  Use night lights.  Install grab bars by the toilet and in the tub and shower. Do not use towel bars as grab bars.  Use non-skid mats or decals in the tub or shower.  If you need to sit down in the shower, use a plastic, non-slip stool.  Keep the floor dry. Clean up any water that spills on the floor as soon as it happens.  Remove soap buildup in the tub or shower regularly.  Attach bath mats securely with double-sided non-slip rug tape.  Do not have throw rugs and other things on the floor that can make you trip. What can I do in the bedroom?  Use night lights.  Make sure that you have a light by your bed that is easy to reach.  Do not use any sheets or blankets that are too big for your bed. They should not hang down onto the floor.  Have a firm chair that has side arms. You can use this for support while you get dressed.  Do not have throw rugs and other things on the floor that can make you trip. What can I do in the  kitchen?  Clean up any spills right away.  Avoid walking on wet floors.  Keep items that you use a lot in easy-to-reach places.  If you need to reach something above you, use a strong step stool that has a grab bar.  Keep electrical cords out of the way.  Do not use floor polish or wax that makes floors slippery. If you must use wax, use non-skid floor wax.  Do not have throw rugs and other things on the floor that can make you trip. What can I do with my stairs?  Do not leave any items on the stairs.  Make sure that there are handrails on both sides of the stairs and use them. Fix handrails that are broken or loose. Make sure that handrails are as long as the stairways.  Check any carpeting to make sure that it is firmly attached to the stairs. Fix any carpet that is loose or worn.  Avoid having throw rugs at the top or bottom of the stairs. If you do have throw rugs, attach them to the floor with carpet tape.  Make sure that you have a light switch at the top of the stairs and the bottom of the stairs. If you do   not have them, ask someone to add them for you. What else can I do to help prevent falls?  Wear shoes that:  Do not have high heels.  Have rubber bottoms.  Are comfortable and fit you well.  Are closed at the toe. Do not wear sandals.  If you use a stepladder:  Make sure that it is fully opened. Do not climb a closed stepladder.  Make sure that both sides of the stepladder are locked into place.  Ask someone to hold it for you, if possible.  Clearly mark and make sure that you can see:  Any grab bars or handrails.  First and last steps.  Where the edge of each step is.  Use tools that help you move around (mobility aids) if they are needed. These include:  Canes.  Walkers.  Scooters.  Crutches.  Turn on the lights when you go into a dark area. Replace any light bulbs as soon as they burn out.  Set up your furniture so you have a clear path.  Avoid moving your furniture around.  If any of your floors are uneven, fix them.  If there are any pets around you, be aware of where they are.  Review your medicines with your doctor. Some medicines can make you feel dizzy. This can increase your chance of falling. Ask your doctor what other things that you can do to help prevent falls. This information is not intended to replace advice given to you by your health care provider. Make sure you discuss any questions you have with your health care provider. Document Released: 03/21/2009 Document Revised: 10/31/2015 Document Reviewed: 06/29/2014 Elsevier Interactive Patient Education  2017 [employer]  

## 2016-09-10 NOTE — Progress Notes (Addendum)
Subjective:    Patient ID: Cheryl Reeves, female    DOB: 1971/03/23, 46 y.o.   MRN: [id number]  HPI  Cheryl Reeves is here today for follow up of chronic medical problem.  Outpatient Encounter Prescriptions as of 09/10/2016  Medication Sig  . aspirin 81 MG tablet Take 81 mg by mouth daily.  . baclofen (LIORESAL) 10 MG tablet TAKE 1 TABLET (10 MG TOTAL) BY MOUTH 2 (TWO) TIMES DAILY.  Marland Kitchen buPROPion (WELLBUTRIN XL) 300 MG 24 hr tablet Take 1 tablet (300 mg total) by mouth daily.  . carbamazepine (CARBATROL) 200 MG 12 hr capsule TAKE 1 CAPSULE (200 MG TOTAL) BY MOUTH 2 (TWO) TIMES DAILY.  . fluticasone (FLONASE) 50 MCG/ACT nasal spray Place 2 sprays into both nostrils daily.  Marland Kitchen lisinopril (PRINIVIL,ZESTRIL) 10 MG tablet TAKE 1 TABLET (10 MG TOTAL) BY MOUTH DAILY.  . nortriptyline (PAMELOR) 25 MG capsule Take 3 capsules (75 mg total) by mouth at bedtime.    1. Migraine without status migrainosus, not intractable, unspecified migraine type  Has not had many lately seems to be under ocntrol  2. Gastroesophageal reflux disease, esophagitis presence not specified  Currently on on any meds for this- no recent flare ups  3. Seizures (HCC)  Last seizure was in 2001- still on carbamazepine  4. Late effect of cerebrovascular accident (CVA)  Right sided weakness- walks with a cane- takes baclofen for muscle spasms  5. Foot drop, right  Right side secondary to stroke    New complaints: Left hand numbness- says that it is the entire hand- last 5-10 minutes aqnd gradually comes back- occurs daily     Review of Systems  Constitutional: Negative.   HENT: Negative.   Respiratory: Negative.   Cardiovascular: Negative.   Genitourinary: Negative.   Psychiatric/Behavioral: Negative.   All other systems reviewed and are negative.      Objective:   Physical Exam  Constitutional: She is oriented to person, place, and time. She appears well-developed and well-nourished. No distress.    Cardiovascular: Normal rate and regular rhythm.   Pulmonary/Chest: Effort normal and breath sounds normal.  Musculoskeletal:  Walking with cane Right sided weakness  Neurological: She is alert and oriented to person, place, and time. She has normal reflexes. No cranial nerve deficit.  Skin: Skin is warm.  Psychiatric: She has a normal mood and affect. Her behavior is normal. Judgment and thought content normal.   BP 114/73   Pulse 93   Temp 97.3 F (36.3 C) (Oral)   Ht  (1.676 m)   Wt 152 lb (68.9 kg)   BMI 24.53 kg/m      Assessment & Plan:  1. Migraine without status migrainosus, not intractable, unspecified migraine type Keep diary of occurences  2. Gastroesophageal reflux disease, esophagitis presence not specified Avoid spicy foods Do not eat 2 hours prior to bedtime  3. Seizures (HCC) - carbamazepine (CARBATROL) 200 MG 12 hr capsule; Take 1 capsule (200 mg total) by mouth 2 (two) times daily.  Dispense: 60 capsule; Refill: 5  4. Late effect of cerebrovascular accident (CVA) eercises - nortriptyline (PAMELOR) 25 MG capsule; Take 3 capsules (75 mg total) by mouth at bedtime.  Dispense: 270 capsule; Refill: 0 - baclofen (LIORESAL) 10 MG tablet; TAKE 1 TABLET (10 MG TOTAL) BY MOUTH 2 (TWO) TIMES DAILY.  Dispense: 60 tablet; Refill: 5  5. Foot drop, right Fall prevention  6. GAD (generalized anxiety disorder) Stress management - buPROPion (WELLBUTRIN XL) 300  MG 24 hr tablet; Take 1 tablet (300 mg total) by mouth daily.  Dispense: 90 tablet; Refill: 0  7. Essential hypertension Low sodium diet - lisinopril (PRINIVIL,ZESTRIL) 10 MG tablet; TAKE 1 TABLET (10 MG TOTAL) BY MOUTH DAILY.  Dispense: 30 tablet; Refill: 5  8. Left hand numbness - nerve conduction study  Labs pending Health maintenance reviewed Diet and exercise encouraged Continue all meds Follow up  In 3 month   [name], FNP

## 2016-09-10 NOTE — Addendum Note (Signed)
Addended by: Bennie Pierini on: 09/10/2016 03:48 PM   Modules accepted: Orders

## 2016-09-10 NOTE — Addendum Note (Signed)
Addended by: Bennie Pierini on: 09/10/2016 04:01 PM   Modules accepted: Orders

## 2016-09-11 LAB — LIPID PANEL
CHOL/HDL RATIO: 3.4 ratio (ref 0.0–4.4)
Cholesterol, Total: 196 mg/dL (ref 100–199)
HDL: 58 mg/dL (ref >39)
LDL CALC: 95 mg/dL (ref 0–99)
Triglycerides: 217 mg/dL — ABNORMAL HIGH (ref 0–149)
VLDL Cholesterol Cal: 43 mg/dL — ABNORMAL HIGH (ref 5–40)

## 2016-09-11 LAB — CMP14+EGFR
ALBUMIN: 4.6 g/dL (ref 3.5–5.5)
ALK PHOS: 89 IU/L (ref 39–117)
ALT: 7 IU/L (ref 0–32)
AST: 17 IU/L (ref 0–40)
Albumin/Globulin Ratio: 1.8 (ref 1.2–2.2)
BUN / CREAT RATIO: 9 (ref 9–23)
BUN: 6 mg/dL (ref 6–24)
Bilirubin Total: <0.2 mg/dL (ref 0.0–1.2)
CO2: 26 mmol/L (ref 18–29)
CREATININE: 0.7 mg/dL (ref 0.57–1.00)
Calcium: 9.3 mg/dL (ref 8.7–10.2)
Chloride: 93 mmol/L — ABNORMAL LOW (ref 96–106)
GFR, EST AFRICAN AMERICAN: 121 mL/min/{1.73_m2} (ref >59)
GFR, EST NON AFRICAN AMERICAN: 105 mL/min/{1.73_m2} (ref >59)
GLOBULIN, TOTAL: 2.6 g/dL (ref 1.5–4.5)
Glucose: 58 mg/dL — ABNORMAL LOW (ref 65–99)
Potassium: 3.9 mmol/L (ref 3.5–5.2)
SODIUM: 135 mmol/L (ref 134–144)
Total Protein: 7.2 g/dL (ref 6.0–8.5)

## 2016-10-12 ENCOUNTER — Ambulatory Visit: Payer: Medicare Other | Admitting: Nurse Practitioner

## 2016-10-16 ENCOUNTER — Encounter (INDEPENDENT_AMBULATORY_CARE_PROVIDER_SITE_OTHER): Payer: Self-pay

## 2016-10-16 ENCOUNTER — Ambulatory Visit (INDEPENDENT_AMBULATORY_CARE_PROVIDER_SITE_OTHER): Payer: Medicare Other | Admitting: Neurology

## 2016-10-16 DIAGNOSIS — Z1231 Encounter for screening mammogram for malignant neoplasm of breast: Secondary | ICD-10-CM | POA: Diagnosis not present

## 2016-10-16 DIAGNOSIS — Z0289 Encounter for other administrative examinations: Secondary | ICD-10-CM

## 2016-10-16 DIAGNOSIS — R202 Paresthesia of skin: Secondary | ICD-10-CM

## 2016-10-16 DIAGNOSIS — G5602 Carpal tunnel syndrome, left upper limb: Secondary | ICD-10-CM | POA: Diagnosis not present

## 2016-10-16 DIAGNOSIS — R200 Anesthesia of skin: Secondary | ICD-10-CM

## 2016-10-20 ENCOUNTER — Other Ambulatory Visit: Payer: Self-pay

## 2016-10-20 DIAGNOSIS — F411 Generalized anxiety disorder: Secondary | ICD-10-CM

## 2016-10-20 DIAGNOSIS — I10 Essential (primary) hypertension: Secondary | ICD-10-CM

## 2016-10-20 DIAGNOSIS — J019 Acute sinusitis, unspecified: Secondary | ICD-10-CM

## 2016-10-20 DIAGNOSIS — H65113 Acute and subacute allergic otitis media (mucoid) (sanguinous) (serous), bilateral: Secondary | ICD-10-CM

## 2016-10-20 DIAGNOSIS — R569 Unspecified convulsions: Secondary | ICD-10-CM

## 2016-10-20 DIAGNOSIS — I6930 Unspecified sequelae of cerebral infarction: Secondary | ICD-10-CM

## 2016-10-20 MED ORDER — BUPROPION HCL ER (XL) 300 MG PO TB24: 300.0000 mg | ORAL_TABLET | Freq: Every day | ORAL | 0 refills | Status: DC

## 2016-10-20 MED ORDER — CARBAMAZEPINE ER 200 MG PO CP12: 200.0000 mg | ORAL_CAPSULE | Freq: Two times a day (BID) | ORAL | 0 refills | Status: DC

## 2016-10-20 MED ORDER — BACLOFEN 10 MG PO TABS: ORAL_TABLET | ORAL | 0 refills | Status: DC

## 2016-10-20 MED ORDER — FLUTICASONE PROPIONATE 50 MCG/ACT NA SUSP: 2.0000 | Freq: Every day | NASAL | 0 refills | Status: DC

## 2016-10-20 MED ORDER — LISINOPRIL 10 MG PO TABS: ORAL_TABLET | ORAL | 0 refills | Status: DC

## 2016-10-21 ENCOUNTER — Other Ambulatory Visit: Payer: Self-pay | Admitting: *Deleted

## 2016-10-21 DIAGNOSIS — I6930 Unspecified sequelae of cerebral infarction: Secondary | ICD-10-CM

## 2016-10-21 MED ORDER — NORTRIPTYLINE HCL 25 MG PO CAPS: 75.0000 mg | ORAL_CAPSULE | Freq: Every day | ORAL | 1 refills | Status: DC

## 2016-10-21 NOTE — Procedures (Addendum)
Full Name: Cheryl Reeves Gender: Female MRN #: [id number] Date of Birth: 06-28-1970    Visit Date: 10/16/2016 13:02 Age: 46 Years 0 Months Old Examining Physician: Levert FeinsteinYijun Rainie Crenshaw, MD  Referring Physician: Terrace ArabiaYan, MD  History: 46 year old history of stroke residual right hemiparesis, ambulate with a cane using her left hand, presented with left hand paresthesia  Summary of the test: Nerve conduction study: Left median sensory responses showed moderately prolonged peak latency, with mildly decreased snap amplitude. Left median motor responses showed mild to moderately prolonged distal latency, with normal C map amplitude, conduction velocity.  Right median, left ulnar sensory and motor responses were normal.  Electromyography: Selective needle examination of left upper extremity muscles and left cervical paraspinal showed no significant abnormality, there was mildly decreased recruitment of the left abductor pollicis brevis, there is no evidence of active denervation.   Conclusion: This is an abnormal study. There is electrodiagnostic evidence of left median neuropathy across the wrist, consistent with moderate left carpal tunnel syndromes, demyelinating in nature, there is no evidence of axonal loss. There is no evidence of left cervical radiculopathy.     ------------------------------- Levert FeinsteinYijun Reganne Messerschmidt, M.D.  [facility] [address] [place], [place] [address] Tel: [phone] Fax: [phone]        [facility]    Nerve / Sites Muscle Latency Ref. Amplitude Ref. Rel Amp Segments Distance Velocity Ref. Area    ms ms mV mV %  cm m/s m/s mVms  L Median - APB     Wrist APB 4.9 ?4.4 6.0 ?4.0 100 Wrist - APB 7   22.3     Upper arm APB 9.3  6.4  107 Upper arm - Wrist 21 49 ?49 22.2  R Median - APB     Wrist APB 3.4 ?4.4 6.6 ?4.0 100 Wrist - APB 7   23.6     Upper arm APB 7.7  6.2  92.8 Upper arm - Wrist 21 49 ?49 23.1  L Ulnar - ADM     Wrist ADM 2.5 ?3.3 9.3 ?6.0 100  Wrist - ADM 7   30.0     B.Elbow ADM 5.6  9.2  99.3 B.Elbow - Wrist 17 55 ?49 29.4     A.Elbow ADM 7.3  9.1  98.4 A.Elbow - B.Elbow 10 58 ?49 28.9         A.Elbow - Wrist               SNC    Nerve / Sites Rec. Site Peak Lat Ref.  Amp Ref. Segments Distance    ms ms V V  cm  L Median - Orthodromic (Dig II, Mid palm)     Dig II Wrist 4.2 ?3.4 5 ?10 Dig II - Wrist 13  R Median - Orthodromic (Dig II, Mid palm)     Dig II Wrist 3.2 ?3.4 11 ?10 Dig II - Wrist 13  L Ulnar - Orthodromic, (Dig V, Mid palm)     Dig V Wrist 2.7 ?3.1 8 ?5 Dig V - Wrist 3211           F  Wave    Nerve F Lat Ref.   ms ms  L Ulnar - ADM 26.5 ?32.0       EMG full       EMG Summary Table    Spontaneous MUAP Recruitment  Muscle IA Fib PSW Fasc Other Amp Dur. Poly Pattern  L. First dorsal interosseous Normal None  None None _______ Normal Normal Normal Normal  L. Abductor pollicis brevis Normal None None None _______ Normal Normal Normal Reduced  L. Pronator teres Normal None None None _______ Normal Normal Normal Normal  L. Biceps brachii Normal None None None _______ Normal Normal Normal Normal  L. Deltoid Normal None None None _______ Normal Normal Normal Normal  L. Triceps brachii Normal None None None _______ Normal Normal Normal Normal  L. Cervical paraspinals Normal None None None _______ Normal Normal Normal Normal

## 2016-11-16 ENCOUNTER — Encounter: Payer: Self-pay | Admitting: Nurse Practitioner

## 2016-11-16 ENCOUNTER — Ambulatory Visit (INDEPENDENT_AMBULATORY_CARE_PROVIDER_SITE_OTHER): Payer: Medicare Other | Admitting: Nurse Practitioner

## 2016-11-16 VITALS — BP 114/71 | HR 67 | Ht 65.0 in | Wt 156.4 lb

## 2016-11-16 DIAGNOSIS — Z8673 Personal history of transient ischemic attack (TIA), and cerebral infarction without residual deficits: Secondary | ICD-10-CM | POA: Diagnosis not present

## 2016-11-16 DIAGNOSIS — I6930 Unspecified sequelae of cerebral infarction: Secondary | ICD-10-CM | POA: Diagnosis not present

## 2016-11-16 DIAGNOSIS — R569 Unspecified convulsions: Secondary | ICD-10-CM

## 2016-11-16 DIAGNOSIS — M21371 Foot drop, right foot: Secondary | ICD-10-CM

## 2016-11-16 DIAGNOSIS — G43909 Migraine, unspecified, not intractable, without status migrainosus: Secondary | ICD-10-CM | POA: Diagnosis not present

## 2016-11-16 MED ORDER — NORTRIPTYLINE HCL 25 MG PO CAPS: 50.0000 mg | ORAL_CAPSULE | Freq: Every day | ORAL | 8 refills | Status: DC

## 2016-11-16 NOTE — Progress Notes (Signed)
[facility]  PATIENT: Cheryl Reeves DOB: [birth year]   REASON FOR VISIT: follow up for hx of stroke, chronic migraine, spastic hemiplegia, right-sided low back pain, history of seizure disorder HISTORY FROM: Patient    HISTORY OF PRESENT ILLNESS: HISTORY: Cheryl Reeves is a 46 years old right-handed female, seen in refer by her primary care physician Dr. Rudi Heap for evaluation of frequent headaches, last visit was with Dr. Hosie Poisson in 2015  She suffered stroke in 1999, was attributed to post partum hemorrhage, with mild aphasia, mild right upper extremity weakness, spastic gait right lower extremity weakness, she also had one seizure when she had a stroke, recurrent seizure in 2001, taking Tegretol 200 mg twice a day  She has frequent headaches even before she had stroke, contributed her sinus headache, had severe headache the day she had stroke, and persistent almost daily headache ever since, since 2014, she has moderate sinus pressure headaches, has been taking daily ibuprofen 10-12 tablets each day, has tried different over-the-counter sinus medications without helping her symptoms.  She never tried preventive medications in the past, she complains of excessive stress, her son just moved out from her house to live with her ex-husband, has difficulty sleeping, is taking Wellbutrin 150 mg every morning I have personally reviewed MRI of the brain in 2007, left frontal encephalomalacia  UPDATE Feb 25 2015:She only take one nortirptyline 25mg  qhs, instead of 2 tablets every night, which has been helpful, she no longer has constant headaches, but she has to take ibuprofen up to 5 tablets each time for her right side low back pain, she complains of right hip, low back pain, related to her abnormal gait, she denies shooting pain to her right lower extremity UPDATE 01/04/2017[name] 46 year old female returns for follow-up. She has been previously followed by Dr. Terrace Arabia.  Records reviewed . She has previous history of stroke and is currently on aspirin with no further stroke or TIA symptoms. She also has a history of seizure disorder with no seizures in many years She also has history of headaches and is currently taking nortriptyline 1 tablet at night with benefit. She continues to have right-sided low back pain probably related to her abnormal gait. She ambulates with a single-point cane and has not had any falls. She returns for reevaluation.  UPDATE 02/11/16 Cheryl Reeves, 46 year old female returns for follow-up. She has previous history of stroke  on aspirin for secondary stroke prevention without further stroke or TIA symptoms. She also has a history of seizure disorder with no seizure in many years. Her primary care orders her carbamazepine with her labs. She also has a history of headaches and is currently on nortriptyline 50 mg at night. She continues to have about 5-10 headaches per month she states. In addition she has some right-sided low back pain related to her abnormal gait she ambulates with a single-point cane no recent falls. She has a AFO to right lower leg. She returns for reevaluation UPDATE 06/11/2018CM Cheryl Reeves, 46 year old female returns for follow-up with a history of stroke event 19 years ago postpartum. She is currently on aspirin for secondary stroke prevention without further stroke or TIA symptoms. She also has history of seizure disorder with no seizure she is currently on Carbatrol twice daily which is managed by her primary care along with labs. She has a history of migraines and sinus headaches and is currently on nortriptyline 75 mg daily. She takes a lot of ibuprofen and  was cautioned against that due to rebound. She also has sinus issues and allergies. She has about 3 headaches per week all varying intensity. She continues to wear an AFO to right lower leg. She uses a cane to ambulate. She denies any falls. She returns for reevaluation REVIEW  OF SYSTEMS: Full 14 system review of systems performed and notable only for those listed, all others are neg:  Constitutional: neg  Cardiovascular: neg Ear/Nose/Throat: neg  Skin: neg Eyes: neg Respiratory: neg Gastroitestinal: neg  Hematology/Lymphatic: Easy bruising  Endocrine: neg Musculoskeletal:  walking difficulty Allergy/Immunology: neg Neurological: History of seizure disorder, history of stroke after pregnancy, headaches Psychiatric:  Sleep : neg   ALLERGIES: Allergies  Allergen Reactions  . Codeine     headaches    HOME MEDICATIONS: Outpatient Medications Prior to Visit  Medication Sig Dispense Refill  . aspirin 81 MG tablet Take 81 mg by mouth daily.    . baclofen (LIORESAL) 10 MG tablet TAKE 1 TABLET (10 MG TOTAL) BY MOUTH 2 (TWO) TIMES DAILY. 180 tablet 0  . buPROPion (WELLBUTRIN XL) 300 MG 24 hr tablet Take 1 tablet (300 mg total) by mouth daily. 90 tablet 0  . carbamazepine (CARBATROL) 200 MG 12 hr capsule Take 1 capsule (200 mg total) by mouth 2 (two) times daily. 180 capsule 0  . fluticasone (FLONASE) 50 MCG/ACT nasal spray Place 2 sprays into both nostrils daily. 48 g 0  . lisinopril (PRINIVIL,ZESTRIL) 10 MG tablet TAKE 1 TABLET (10 MG TOTAL) BY MOUTH DAILY. 90 tablet 0  . nortriptyline (PAMELOR) 25 MG capsule Take 3 capsules (75 mg total) by mouth at bedtime. 270 capsule 1   No facility-administered medications prior to visit.     PAST MEDICAL HISTORY: Past Medical History:  Diagnosis Date  . Allergy   . DJD (degenerative joint disease)   . GERD (gastroesophageal reflux disease)   . Stroke [facility](HCC) 1999   S/p childbirth-weakness rt leg  . Wears glasses     PAST SURGICAL HISTORY: Past Surgical History:  Procedure Laterality Date  . ARTHROSCOPY KNEE W/ DRILLING  2008   rt  . REPLACEMENT TOTAL KNEE Right 2009  . SEPTOPLASTY Bilateral 07/21/2013   Procedure: SEPTOPLASTY;  Surgeon: Suzanna ObeyJohn Byers, MD;  Location: [facility];  Service: ENT;   Laterality: Bilateral;  . SINUS ENDO W/FUSION Bilateral 07/21/2013   Procedure: ENDOSCOPIC SINUS SURGERY WITH FUSION NAVIGATION;  Surgeon: Suzanna ObeyJohn Byers, MD;  Location:  SURGERY CENTER;  Service: ENT;  Laterality: Bilateral;  . TUBAL LIGATION  2000    FAMILY HISTORY: Family History  Problem Relation Age of Onset  . Cancer Mother        lung  . Heart disease Father   . Diabetes Unknown   . Arthritis/Rheumatoid Unknown     SOCIAL HISTORY: Social History   Social History  . Marital status: Single    Spouse name: N/A  . Number of children: N/A  . Years of education: N/A   Occupational History  . Not on file.   Social History Main Topics  . Smoking status: Current Every Day Smoker    Packs/day: 1.00    Types: Cigarettes    Last attempt to quit: 12/06/2011  . Smokeless tobacco: Never Used  . Alcohol use No  . Drug use: Yes    Types: Marijuana     Comment: occasionally  . Sexual activity: Not on file   Other Topics Concern  . Not on file   Social History  Narrative   Single, 1 child   Right handed   10 th grade   1/2-1 cup daily     PHYSICAL EXAM  Vitals:   11/16/16 0913  BP: 114/71  Pulse: 67  Weight: 156 lb 6.4 oz (70.9 kg)  Height: 5\' 5"  (1.651 m)   Body mass index is 26.03 kg/m.  Generalized: Well developed, in no acute distress  Head: normocephalic and atraumatic,. Oropharynx benign , bilateral cerumen Neck: Supple, no carotid bruits  Cardiac: Regular rate rhythm, no murmur  Musculoskeletal: No deformity   Neurological examination   Mentation: Alert oriented to time, place, history taking. Attention span and concentration appropriate. Recent and remote memory intact.  Follows all commands speech with mild expressive aphasia .   Cranial nerve II-XII: .Pupils were equal round reactive to light extraocular movements were full, visual field were full on confrontational test. Facial sensation and strength were normal. hearing was intact to finger  rubbing bilaterally. Uvula tongue midline. head turning and shoulder shrug were normal and symmetric.Tongue protrusion into cheek strength was normal. Motor: Mild right arm proximal and distal weakness, moderate right lower extremity spasticity with proximal and distal weakness . AFO  right lower leg Sensory: normal and symmetric to light touch, pinprick, and  Vibration, In the upper and lower extremities  Coordination: finger-nose-finger, heel-to-shin bilaterally, no dysmetria Reflexes: Symmetric upper and lower, plantar responses were flexor bilaterally. Gait and Station: Right spastic hemicircumferential gait with cane DIAGNOSTIC DATA (LABS, IMAGING, TESTING) - I reviewed patient records, labs, notes, testing and imaging myself where available.  Lab Results  Component Value Date   WBC 4.7 12/26/2014   HGB 12.9 12/26/2014   HCT 39.5 12/26/2014   MCV 91.5 12/26/2014   PLT 185 06/17/2007      Component Value Date/Time   NA 135 09/10/2016 1549   K 3.9 09/10/2016 1549   CL 93 (L) 09/10/2016 1549   CO2 26 09/10/2016 1549   GLUCOSE 58 (L) 09/10/2016 1549   GLUCOSE 118 (H) 06/16/2007 0518   BUN 6 09/10/2016 1549   CREATININE 0.70 09/10/2016 1549   CALCIUM 9.3 09/10/2016 1549   PROT 7.2 09/10/2016 1549   ALBUMIN 4.6 09/10/2016 1549   AST 17 09/10/2016 1549   ALT 7 09/10/2016 1549   ALKPHOS 89 09/10/2016 1549   BILITOT <0.2 09/10/2016 1549   GFRNONAA 105 09/10/2016 1549   GFRAA 121 09/10/2016 1549   Lab Results  Component Value Date   CHOL 196 09/10/2016   HDL 58 09/10/2016   LDLCALC 95 09/10/2016   TRIG 217 (H) 09/10/2016   CHOLHDL 3.4 09/10/2016    Lab Results  Component Value Date   TSH 1.940 12/26/2014      ASSESSMENT AND PLAN  46 y.o. year old female  has a past medical history of left frontal stroke with residual spastic right hemiparesis, unsteady gait right-sided low back pain and chronic headaches. Not a candidate for triptan's and [place] non-covered by  insurance.  PLAN: May taper  nortriptyline by 1 tab to 2 tablets at night for headaches  Continue Carbatrol at current dose labs monitored by primary care And refilled by her primary care Call for seizure activity Reviewed recent labs CMP and lipid profile 09/10/2016 Continue aspirin for secondary stroke prevention Stop  ibuprofen as it causes rebound given information on rebound headache and reviewed this information with her Follow up in 8 months Next with Dr. Carmelina Noun, [facility], [facility], APRN  [facility] [phone] [address]  [address] [place], [place] [address] [phone]

## 2016-11-16 NOTE — Patient Instructions (Signed)
May taper  nortriptyline by 1 tab to 2 tablets at night for headaches  Continue Carbatrol at current dose labs monitored by primary care Continue aspirin for secondary stroke prevention Stop  ibuprofen as it causes rebound given information  Follow up in 8 months Next with Dr. Terrace ArabiaYan

## 2016-11-17 ENCOUNTER — Encounter: Payer: Self-pay | Admitting: Family

## 2016-11-17 ENCOUNTER — Ambulatory Visit (INDEPENDENT_AMBULATORY_CARE_PROVIDER_SITE_OTHER): Payer: Medicare Other | Admitting: Family

## 2016-11-17 VITALS — BP 131/78 | HR 87 | Temp 97.6°F | Ht 65.0 in | Wt 156.0 lb

## 2016-11-17 DIAGNOSIS — R300 Dysuria: Secondary | ICD-10-CM | POA: Diagnosis not present

## 2016-11-17 DIAGNOSIS — N3001 Acute cystitis with hematuria: Secondary | ICD-10-CM | POA: Diagnosis not present

## 2016-11-17 DIAGNOSIS — J0191 Acute recurrent sinusitis, unspecified: Secondary | ICD-10-CM

## 2016-11-17 LAB — MICROSCOPIC EXAMINATION: Renal Epithel, UA: NONE SEEN /hpf

## 2016-11-17 LAB — URINALYSIS, COMPLETE
BILIRUBIN UA: NEGATIVE
Glucose, UA: NEGATIVE
KETONES UA: NEGATIVE
NITRITE UA: NEGATIVE
Protein, UA: NEGATIVE
SPEC GRAV UA: 1.01 (ref 1.005–1.030)
Urobilinogen, Ur: 0.2 mg/dL (ref 0.2–1.0)
pH, UA: 7 (ref 5.0–7.5)

## 2016-11-17 MED ORDER — PREDNISONE 10 MG (21) PO TBPK: ORAL_TABLET | ORAL | 0 refills | Status: DC

## 2016-11-17 MED ORDER — AMOXICILLIN-POT CLAVULANATE 875-125 MG PO TABS: 1.0000 | ORAL_TABLET | Freq: Two times a day (BID) | ORAL | 0 refills | Status: DC

## 2016-11-17 NOTE — Progress Notes (Signed)
Subjective:    Patient ID: Cheryl Reeves, female    DOB: 1970/09/11, 46 y.o.   MRN: [id number]  Dysuria   This is a new problem. The current episode started in the past 7 days. The problem occurs intermittently. The problem has been gradually worsening. The quality of the pain is described as burning. The pain is at a severity of 4/10. The pain is mild. There has been no fever. Associated symptoms include a discharge, frequency, hematuria, hesitancy and urgency. Pertinent negatives include no chills. She has tried increased fluids for the symptoms. The treatment provided mild relief.  Sinusitis  This is a recurrent problem. The current episode started 1 to 4 weeks ago. The problem has been gradually worsening since onset. There has been no fever. Associated symptoms include congestion, ear pain (left), headaches and sinus pressure. Pertinent negatives include no chills, coughing, hoarse voice, sneezing or sore throat. Past treatments include oral decongestants and acetaminophen. The treatment provided mild relief.      Review of Systems  Constitutional: Negative for chills.  HENT: Positive for congestion, ear pain (left) and sinus pressure. Negative for hoarse voice, sneezing and sore throat.   Respiratory: Negative for cough.   Genitourinary: Positive for dysuria, frequency, hematuria, hesitancy and urgency.  Neurological: Positive for headaches.  All other systems reviewed and are negative.      Objective:   Physical Exam  Constitutional: She is oriented to person, place, and time. She appears well-developed and well-nourished. No distress.  HENT:  Head: Normocephalic and atraumatic.  Right Ear: External ear normal.  Left Ear: External ear normal.  Nose: Rhinorrhea present. Right sinus exhibits maxillary sinus tenderness. Left sinus exhibits maxillary sinus tenderness.  Mouth/Throat: Oropharynx is clear and moist.  Eyes: Pupils are equal, round, and reactive to light.  Neck:  Normal range of motion. Neck supple. No thyromegaly present.  Cardiovascular: Normal rate, regular rhythm, normal heart sounds and intact distal pulses.   No murmur heard. Pulmonary/Chest: Effort normal and breath sounds normal. No respiratory distress. She has no wheezes.  Abdominal: Soft. Bowel sounds are normal. She exhibits no distension. There is no tenderness.  Musculoskeletal: She exhibits no edema or tenderness.  Right leg weakness from CVA  Neurological: She is alert and oriented to person, place, and time.  Skin: Skin is warm and dry.  Psychiatric: She has a normal mood and affect. Her behavior is normal. Judgment and thought content normal.  Vitals reviewed.     BP 131/78   Pulse 87   Temp 97.6 F (36.4 C) (Oral)   Ht 5\' 5"  (1.651 m)   Wt 156 lb (70.8 kg)   BMI 25.96 kg/m      Assessment & Plan:  1. Dysuria - Urinalysis, Complete  2. Acute cystitis with hematuria Force fluids AZO over the counter X2 days RTO prn Culture pending - amoxicillin-clavulanate (AUGMENTIN) 875-125 MG tablet; Take 1 tablet by mouth 2 (two) times daily.  Dispense: 14 tablet; Refill: 0  3. Acute recurrent sinusitis, unspecified location - Take meds as prescribed - Use a cool mist humidifier  -Use saline nose sprays frequently -Saline irrigations of the nose can be very helpful if done frequently.  * 4X daily for 1 week*  * Use of a nettie pot can be helpful with this. Follow directions with this* -Force fluids -For any cough or congestion  Use plain Mucinex- regular strength or max strength is fine   * Children- consult with Pharmacist for dosing -For  fever or aces or pains- take tylenol or ibuprofen appropriate for age and weight.  * for fevers greater than 101 orally you may alternate ibuprofen and tylenol every  3 hours. - amoxicillin-clavulanate (AUGMENTIN) 875-125 MG tablet; Take 1 tablet by mouth 2 (two) times daily.  Dispense: 14 tablet; Refill: 0 - predniSONE (STERAPRED  UNI-PAK 21 TAB) 10 MG (21) TBPK tablet; Use as directed  Dispense: 21 tablet; Refill: 0  Jannifer Rodney, FNP

## 2016-11-17 NOTE — Patient Instructions (Signed)

## 2016-11-19 LAB — URINE CULTURE

## 2016-11-20 NOTE — Progress Notes (Signed)
I have reviewed and agreed above plan. 

## 2016-12-04 ENCOUNTER — Other Ambulatory Visit: Payer: Self-pay | Admitting: Nurse Practitioner

## 2016-12-04 DIAGNOSIS — F411 Generalized anxiety disorder: Secondary | ICD-10-CM

## 2017-02-08 ENCOUNTER — Other Ambulatory Visit: Payer: Self-pay | Admitting: Pediatrics

## 2017-02-08 DIAGNOSIS — J019 Acute sinusitis, unspecified: Secondary | ICD-10-CM

## 2017-02-08 DIAGNOSIS — H65113 Acute and subacute allergic otitis media (mucoid) (sanguinous) (serous), bilateral: Secondary | ICD-10-CM

## 2017-02-12 ENCOUNTER — Other Ambulatory Visit: Payer: Self-pay | Admitting: Pediatrics

## 2017-02-12 DIAGNOSIS — I6930 Unspecified sequelae of cerebral infarction: Secondary | ICD-10-CM

## 2017-02-14 ENCOUNTER — Other Ambulatory Visit: Payer: Self-pay | Admitting: Pediatrics

## 2017-02-14 DIAGNOSIS — I10 Essential (primary) hypertension: Secondary | ICD-10-CM

## 2017-03-12 ENCOUNTER — Other Ambulatory Visit: Payer: Self-pay | Admitting: *Deleted

## 2017-03-12 DIAGNOSIS — I6930 Unspecified sequelae of cerebral infarction: Secondary | ICD-10-CM

## 2017-03-12 MED ORDER — NORTRIPTYLINE HCL 25 MG PO CAPS: 50.0000 mg | ORAL_CAPSULE | Freq: Every day | ORAL | 1 refills | Status: DC

## 2017-03-16 ENCOUNTER — Other Ambulatory Visit: Payer: Self-pay | Admitting: Nurse Practitioner

## 2017-03-16 DIAGNOSIS — I10 Essential (primary) hypertension: Secondary | ICD-10-CM

## 2017-03-16 DIAGNOSIS — F411 Generalized anxiety disorder: Secondary | ICD-10-CM

## 2017-03-17 NOTE — Telephone Encounter (Signed)
Last seen 11/17/16

## 2017-04-26 ENCOUNTER — Ambulatory Visit: Payer: Medicare Other | Admitting: *Deleted

## 2017-04-30 ENCOUNTER — Encounter: Payer: Self-pay | Admitting: Pediatrics

## 2017-04-30 ENCOUNTER — Ambulatory Visit (INDEPENDENT_AMBULATORY_CARE_PROVIDER_SITE_OTHER): Payer: Medicare Other | Admitting: Pediatrics

## 2017-04-30 VITALS — BP 131/88 | HR 80 | Temp 97.9°F | Resp 18 | Ht 65.0 in | Wt 155.4 lb

## 2017-04-30 DIAGNOSIS — H66003 Acute suppurative otitis media without spontaneous rupture of ear drum, bilateral: Secondary | ICD-10-CM | POA: Diagnosis not present

## 2017-04-30 MED ORDER — AMOXICILLIN 500 MG PO CAPS: 500.0000 mg | ORAL_CAPSULE | Freq: Three times a day (TID) | ORAL | 0 refills | Status: DC

## 2017-04-30 NOTE — Progress Notes (Signed)
  Subjective:   Patient ID: Cheryl Reeves, female    DOB: 06/25/1970, 46 y.o.   MRN: [id number] CC: Cough; Nasal Congestion; and Ear Pain  HPI: Cheryl Reeves is a 46 y.o. female presenting for Cough; Nasal Congestion; and Ear Pain  Sick for about two weeks Now feeling more rough past two days, run down, coughing more Ongoing sinus congestion and runny nose hasnt been around anyone else sick that she knows of Normal appetite, no fevers  Taking alkaseltzer, tylenol cold sinus Some improvement No SOB Cough is non-productive Ears have been feeling full  Relevant past medical, surgical, family and social history reviewed. Allergies and medications reviewed and updated. Social History   Tobacco Use  Smoking Status Current Every Day Smoker  . Packs/day: 1.00  . Types: Cigarettes  . Last attempt to quit: 12/06/2011  . Years since quitting: 5.4  Smokeless Tobacco Never Used   ROS: Per HPI   Objective:    BP 131/88   Pulse 80   Temp 97.9 F (36.6 C) (Oral)   Resp 18   Ht 5\' 5"  (1.651 m)   Wt 155 lb 6.4 oz (70.5 kg)   SpO2 100%   BMI 25.86 kg/m   Wt Readings from Last 3 Encounters:  04/30/17 155 lb 6.4 oz (70.5 kg)  11/17/16 156 lb (70.8 kg)  11/16/16 156 lb 6.4 oz (70.9 kg)    Gen: NAD, alert, cooperative with exam, NCAT, congested EYES: EOMI, no conjunctival injection, or no icterus ENT:  TMs dull with white effusion b/l, OP with mild erythema LYMPH: no cervical LAD CV: NRRR, normal S1/S2, no murmur, distal pulses 2+ b/l Resp: CTABL, no wheezes, normal WOB Abd: +BS, soft, NTND. Ext: No edema, warm Neuro: Alert and oriented   Assessment & Plan:  Natasha MeadJeri was seen today for cough, nasal congestion and ear pain.  Diagnoses and all orders for this visit:  Acute suppurative otitis media of both ears without spontaneous rupture of tympanic membranes, recurrence not specified  Start below -     amoxicillin (AMOXIL) 500 MG capsule; Take 1 capsule (500 mg total) by mouth 3  (three) times daily.  Acute URI Discussed sinus rinses, symptom care  Follow up plan: Return if symptoms worsen or fail to improve. Rex Krasarol Vincent, MD [facility]

## 2017-05-14 ENCOUNTER — Other Ambulatory Visit: Payer: Self-pay | Admitting: Family

## 2017-05-14 DIAGNOSIS — I6930 Unspecified sequelae of cerebral infarction: Secondary | ICD-10-CM

## 2017-06-03 ENCOUNTER — Other Ambulatory Visit: Payer: Self-pay | Admitting: Family

## 2017-06-03 DIAGNOSIS — I6930 Unspecified sequelae of cerebral infarction: Secondary | ICD-10-CM

## 2017-06-04 ENCOUNTER — Other Ambulatory Visit: Payer: Self-pay

## 2017-06-04 ENCOUNTER — Telehealth: Payer: Self-pay | Admitting: Nurse Practitioner

## 2017-06-04 DIAGNOSIS — I6930 Unspecified sequelae of cerebral infarction: Secondary | ICD-10-CM

## 2017-06-04 MED ORDER — BACLOFEN 10 MG PO TABS: 10.0000 mg | ORAL_TABLET | Freq: Two times a day (BID) | ORAL | 0 refills | Status: DC

## 2017-06-04 NOTE — Telephone Encounter (Signed)
Last filled Baclofen 05/15/17 #180  Dr Vincent's pt  Pt calling in for RF

## 2017-06-04 NOTE — Telephone Encounter (Signed)
What is the name of the medication? baclofen  Have you contacted your pharmacy to request a refill? yes  Which pharmacy would you [facility] this sent to[facility], was told it was denied to get refilled   Patient notified that their request is being sent to the clinical staff for review and that they should receive a call once it is complete. If they do not receive a call within 24 hours they can check with their pharmacy or our office.

## 2017-07-19 ENCOUNTER — Ambulatory Visit: Payer: Medicare Other | Admitting: Neurology

## 2017-08-13 ENCOUNTER — Ambulatory Visit (INDEPENDENT_AMBULATORY_CARE_PROVIDER_SITE_OTHER): Payer: PPO | Admitting: Nurse Practitioner

## 2017-08-13 ENCOUNTER — Ambulatory Visit (INDEPENDENT_AMBULATORY_CARE_PROVIDER_SITE_OTHER): Payer: PPO

## 2017-08-13 ENCOUNTER — Encounter: Payer: Self-pay | Admitting: Nurse Practitioner

## 2017-08-13 VITALS — BP 134/89 | HR 87 | Temp 97.3°F | Ht 65.0 in | Wt 155.0 lb

## 2017-08-13 DIAGNOSIS — S99911A Unspecified injury of right ankle, initial encounter: Secondary | ICD-10-CM | POA: Diagnosis not present

## 2017-08-13 DIAGNOSIS — S93401A Sprain of unspecified ligament of right ankle, initial encounter: Secondary | ICD-10-CM

## 2017-08-13 DIAGNOSIS — M25571 Pain in right ankle and joints of right foot: Secondary | ICD-10-CM

## 2017-08-13 NOTE — Patient Instructions (Signed)
Ankle Sprain  An ankle sprain is a stretch or tear in one of the tough tissues (ligaments) in your ankle.  Follow these instructions at home:   Rest your ankle.   Take over-the-counter and prescription medicines only as told by your doctor.   For 2-3 days, keep your ankle higher than the level of your heart (elevated) as much as possible.   If directed, put ice on the area:  ? Put ice in a plastic bag.  ? Place a towel between your skin and the bag.  ? Leave the ice on for 20 minutes, 2-3 times a day.   If you were given a brace:  ? Wear it as told.  ? Take it off to shower or bathe.  ? Try not to move your ankle much, but wiggle your toes from time to time. This helps to prevent swelling.   If you were given an elastic bandage (dressing):  ? Take it off when you shower or bathe.  ? Try not to move your ankle much, but wiggle your toes from time to time. This helps to prevent swelling.  ? Adjust the bandage to make it more comfortable if it feels too tight.  ? Loosen the bandage if you lose feeling in your foot, your foot tingles, or your foot gets cold and blue.   If you have crutches, use them as told by your doctor. Continue to use them until you can walk without feeling pain in your ankle.  Contact a doctor if:   Your bruises or swelling are quickly getting worse.   Your pain does not get better after you take medicine.  Get help right away if:   You cannot feel your toes or foot.   Your toes or your foot looks blue.   You have very bad pain that gets worse.  This information is not intended to replace advice given to you by your health care provider. Make sure you discuss any questions you have with your health care provider.  Document Released: 11/11/2007 Document Revised: 10/31/2015 Document Reviewed: 12/25/2014  Elsevier Interactive Patient Education  2018 [employer]

## 2017-08-13 NOTE — Progress Notes (Signed)
   Subjective:    Patient ID: Cheryl Reeves, female    DOB: May 06, 1971, 47 y.o.   MRN: [id number]  HPI Patient comes in today c/o right ankle pain. She fell Sunday in her kitchen. She has been staying off of it because it hurts to walk on. Pain is low when sitting.   Review of Systems  Constitutional: Negative.   Respiratory: Negative.   Cardiovascular: Negative.   Musculoskeletal: Positive for arthralgias (right ankle).  Neurological: Negative.   Psychiatric/Behavioral: Negative.   All other systems reviewed and are negative.      Objective:   Physical Exam  Constitutional: She appears well-developed and well-nourished. No distress.  Cardiovascular: Normal rate and regular rhythm.  Pulmonary/Chest: Effort normal and breath sounds normal.  Musculoskeletal:  Pain on palpation medial side of right ankle Mild edema FROM with pain on inversion and eversion  Neurological: She is alert.  Skin: Skin is warm.  Psychiatric: She has a normal mood and affect. Her behavior is normal. Judgment and thought content normal.   BP 134/89   Pulse 87   Temp (!) 97.3 F (36.3 C) (Oral)   Ht 5\' 5"  (1.651 m)   Wt 155 lb (70.3 kg)   BMI 25.79 kg/m   Right ankle xray- no fracture    Assessment & Plan:   1. Acute right ankle pain   2. Sprain of right ankle, unspecified ligament, initial encounter    Rest Ice bid Wrap when up walking Elevate when sitting  Mary-Margaret Daphine DeutscherMartin, FNP

## 2017-08-31 ENCOUNTER — Other Ambulatory Visit: Payer: Self-pay | Admitting: *Deleted

## 2017-08-31 DIAGNOSIS — I6930 Unspecified sequelae of cerebral infarction: Secondary | ICD-10-CM

## 2017-08-31 MED ORDER — BACLOFEN 10 MG PO TABS: 10.0000 mg | ORAL_TABLET | Freq: Two times a day (BID) | ORAL | 0 refills | Status: DC

## 2017-09-01 DIAGNOSIS — S22058A Other fracture of T5-T6 vertebra, initial encounter for closed fracture: Secondary | ICD-10-CM | POA: Diagnosis not present

## 2017-09-01 DIAGNOSIS — M542 Cervicalgia: Secondary | ICD-10-CM | POA: Diagnosis not present

## 2017-09-01 DIAGNOSIS — S299XXA Unspecified injury of thorax, initial encounter: Secondary | ICD-10-CM | POA: Diagnosis not present

## 2017-09-01 DIAGNOSIS — S098XXA Other specified injuries of head, initial encounter: Secondary | ICD-10-CM | POA: Diagnosis not present

## 2017-09-01 DIAGNOSIS — S0990XA Unspecified injury of head, initial encounter: Secondary | ICD-10-CM | POA: Diagnosis not present

## 2017-09-01 DIAGNOSIS — M5134 Other intervertebral disc degeneration, thoracic region: Secondary | ICD-10-CM | POA: Diagnosis not present

## 2017-09-01 DIAGNOSIS — Z8679 Personal history of other diseases of the circulatory system: Secondary | ICD-10-CM | POA: Diagnosis not present

## 2017-09-01 DIAGNOSIS — M4184 Other forms of scoliosis, thoracic region: Secondary | ICD-10-CM | POA: Diagnosis not present

## 2017-09-01 DIAGNOSIS — S22039A Unspecified fracture of third thoracic vertebra, initial encounter for closed fracture: Secondary | ICD-10-CM | POA: Diagnosis not present

## 2017-09-01 DIAGNOSIS — R300 Dysuria: Secondary | ICD-10-CM | POA: Diagnosis not present

## 2017-09-01 DIAGNOSIS — N3090 Cystitis, unspecified without hematuria: Secondary | ICD-10-CM | POA: Diagnosis not present

## 2017-09-01 DIAGNOSIS — F1410 Cocaine abuse, uncomplicated: Secondary | ICD-10-CM | POA: Diagnosis not present

## 2017-09-01 DIAGNOSIS — Z8669 Personal history of other diseases of the nervous system and sense organs: Secondary | ICD-10-CM | POA: Diagnosis not present

## 2017-09-01 DIAGNOSIS — F1190 Opioid use, unspecified, uncomplicated: Secondary | ICD-10-CM | POA: Diagnosis not present

## 2017-09-01 DIAGNOSIS — S0191XA Laceration without foreign body of unspecified part of head, initial encounter: Secondary | ICD-10-CM | POA: Diagnosis not present

## 2017-09-01 DIAGNOSIS — Z23 Encounter for immunization: Secondary | ICD-10-CM | POA: Diagnosis not present

## 2017-09-01 DIAGNOSIS — R51 Headache: Secondary | ICD-10-CM | POA: Diagnosis not present

## 2017-09-01 DIAGNOSIS — R079 Chest pain, unspecified: Secondary | ICD-10-CM | POA: Diagnosis not present

## 2017-09-01 DIAGNOSIS — S199XXA Unspecified injury of neck, initial encounter: Secondary | ICD-10-CM | POA: Diagnosis not present

## 2017-09-01 DIAGNOSIS — S22048A Other fracture of fourth thoracic vertebra, initial encounter for closed fracture: Secondary | ICD-10-CM | POA: Diagnosis not present

## 2017-09-01 DIAGNOSIS — S3993XA Unspecified injury of pelvis, initial encounter: Secondary | ICD-10-CM | POA: Diagnosis not present

## 2017-09-01 DIAGNOSIS — S22029A Unspecified fracture of second thoracic vertebra, initial encounter for closed fracture: Secondary | ICD-10-CM | POA: Diagnosis not present

## 2017-09-01 DIAGNOSIS — Z8673 Personal history of transient ischemic attack (TIA), and cerebral infarction without residual deficits: Secondary | ICD-10-CM | POA: Diagnosis not present

## 2017-09-01 DIAGNOSIS — S0190XA Unspecified open wound of unspecified part of head, initial encounter: Secondary | ICD-10-CM | POA: Diagnosis not present

## 2017-09-01 DIAGNOSIS — F1490 Cocaine use, unspecified, uncomplicated: Secondary | ICD-10-CM | POA: Diagnosis not present

## 2017-09-01 DIAGNOSIS — R102 Pelvic and perineal pain: Secondary | ICD-10-CM | POA: Diagnosis not present

## 2017-09-05 ENCOUNTER — Other Ambulatory Visit: Payer: Self-pay

## 2017-09-05 ENCOUNTER — Encounter (HOSPITAL_COMMUNITY): Payer: Self-pay | Admitting: Emergency Medicine

## 2017-09-05 ENCOUNTER — Emergency Department (HOSPITAL_COMMUNITY): Payer: No Typology Code available for payment source

## 2017-09-05 ENCOUNTER — Emergency Department (HOSPITAL_COMMUNITY)
Admission: EM | Admit: 2017-09-05 | Discharge: 2017-09-05 | Disposition: A | Discharge status: Home / Self Care | Payer: No Typology Code available for payment source | Attending: Emergency Medicine | Admitting: Emergency Medicine

## 2017-09-05 DIAGNOSIS — Z7982 Long term (current) use of aspirin: Secondary | ICD-10-CM | POA: Insufficient documentation

## 2017-09-05 DIAGNOSIS — F17210 Nicotine dependence, cigarettes, uncomplicated: Secondary | ICD-10-CM | POA: Diagnosis not present

## 2017-09-05 DIAGNOSIS — Z79899 Other long term (current) drug therapy: Secondary | ICD-10-CM | POA: Diagnosis not present

## 2017-09-05 DIAGNOSIS — H9201 Otalgia, right ear: Secondary | ICD-10-CM

## 2017-09-05 DIAGNOSIS — R519 Headache, unspecified: Secondary | ICD-10-CM

## 2017-09-05 DIAGNOSIS — R51 Headache: Secondary | ICD-10-CM | POA: Diagnosis present

## 2017-09-05 HISTORY — DX: Unspecified injury of thorax, initial encounter: S29.9XXA

## 2017-09-05 MED ORDER — ONDANSETRON 4 MG PO TBDP
4.0000 mg | ORAL_TABLET | Freq: Once | ORAL | Status: AC
  Administered 2017-09-05: 4 mg via ORAL
  Filled 2017-09-05: qty 1

## 2017-09-05 MED ORDER — METHOCARBAMOL 500 MG PO TABS: 500.0000 mg | ORAL_TABLET | Freq: Four times a day (QID) | ORAL | 0 refills | Status: AC

## 2017-09-05 MED ORDER — PREDNISONE 10 MG PO TABS: ORAL_TABLET | ORAL | 0 refills | Status: DC

## 2017-09-05 MED ORDER — KETOROLAC TROMETHAMINE 30 MG/ML IJ SOLN: 30.0000 mg | Freq: Once | INTRAMUSCULAR | Status: DC

## 2017-09-05 MED ORDER — KETOROLAC TROMETHAMINE 30 MG/ML IJ SOLN
30.0000 mg | Freq: Once | INTRAMUSCULAR | Status: AC
Start: 2017-09-05 — End: 2017-09-05
  Administered 2017-09-05: 30 mg via INTRAMUSCULAR
  Filled 2017-09-05: qty 1

## 2017-09-05 MED ORDER — PREDNISONE 50 MG PO TABS
60.0000 mg | ORAL_TABLET | Freq: Once | ORAL | Status: AC
  Administered 2017-09-05: 22:00:00 60 mg via ORAL
  Filled 2017-09-05: qty 1

## 2017-09-05 MED ORDER — OXYCODONE-ACETAMINOPHEN 5-325 MG PO TABS: 1.0000 | ORAL_TABLET | ORAL | 0 refills | Status: DC | PRN

## 2017-09-05 MED ORDER — METHOCARBAMOL 500 MG PO TABS
1000.0000 mg | ORAL_TABLET | Freq: Once | ORAL | Status: AC
  Administered 2017-09-05: 1000 mg via ORAL
  Filled 2017-09-05: qty 2

## 2017-09-05 MED ORDER — ONDANSETRON 4 MG PO TBDP: 4.0000 mg | ORAL_TABLET | Freq: Three times a day (TID) | ORAL | 0 refills | Status: DC | PRN

## 2017-09-05 MED ORDER — HYDROMORPHONE HCL 1 MG/ML IJ SOLN
1.0000 mg | Freq: Once | INTRAMUSCULAR | Status: AC
  Administered 2017-09-05: 1 mg via INTRAMUSCULAR
  Filled 2017-09-05: qty 1
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
## 2017-09-05 NOTE — Discharge Instructions (Signed)
Use the medicines prescribed. Do not drive within 4 hours of taking the oxycodone as this will make you sleepy.  See your doctor tomorrow as planned.
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
## 2017-09-06 ENCOUNTER — Ambulatory Visit: Payer: PPO | Admitting: Nurse Practitioner

## 2017-09-07 ENCOUNTER — Ambulatory Visit (INDEPENDENT_AMBULATORY_CARE_PROVIDER_SITE_OTHER): Payer: PPO | Admitting: Nurse Practitioner

## 2017-09-07 ENCOUNTER — Encounter: Payer: Self-pay | Admitting: Nurse Practitioner

## 2017-09-07 VITALS — BP 154/94 | HR 103 | Temp 97.4°F | Ht 64.0 in | Wt 161.0 lb

## 2017-09-07 DIAGNOSIS — F4310 Post-traumatic stress disorder, unspecified: Secondary | ICD-10-CM

## 2017-09-07 DIAGNOSIS — S22040A Wedge compression fracture of fourth thoracic vertebra, initial encounter for closed fracture: Secondary | ICD-10-CM

## 2017-09-07 DIAGNOSIS — R6884 Jaw pain: Secondary | ICD-10-CM | POA: Diagnosis not present

## 2017-09-07 MED ORDER — ALPRAZOLAM 0.5 MG PO TABS: 0.5000 mg | ORAL_TABLET | Freq: Two times a day (BID) | ORAL | 0 refills | Status: DC | PRN

## 2017-09-07 MED ORDER — IBUPROFEN 800 MG PO TABS: 800.0000 mg | ORAL_TABLET | Freq: Three times a day (TID) | ORAL | 0 refills | Status: DC | PRN
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
## 2017-09-07 NOTE — Addendum Note (Signed)
Addended by: Bennie PieriniMARTIN, MARY-MARGARET on: 09/07/2017 01:05 PM   Modules accepted: Orders

## 2017-09-07 NOTE — Patient Instructions (Signed)
Living With Post-Traumatic Stress Disorder If you have been diagnosed with post-traumatic stress disorder (PTSD), you may be relieved that you now know why you have felt or behaved a certain way. Still, you may feel overwhelmed about the treatment ahead. You may also wonder how to get the support you need and how to deal with the condition day-to-day. If you are living with PTSD, there are ways to help you recover from it and manage your symptoms. How to manage lifestyle changes Managing stress Stress is your body's reaction to life changes and events, both good and bad. Stress can make PTSD worse. Take the following steps to cope with stress:  Talk with your health care provider or a counselor if you would like to learn more about techniques to reduce your stress. He or she may suggest some stress reduction techniques such as: ? Muscle relaxation exercises. ? Regular exercise. ? Meditation, yoga, or other mind-body exercises. ? Breathing exercises. ? Listening to quiet music. ? Spending time outside.  Maintain a healthy lifestyle. Eat a healthy diet, exercise regularly, get plenty of sleep, and take time to relax.  Spend time with others. Talk with them about how you are feeling and what kind of support you need. Try to not isolate yourself, even though you may feel like doing that. Isolating yourself can delay your recovery.  Do activities and hobbies that you enjoy.  Pace yourself when doing stressful things. Take breaks, and reward yourself when you finish. Make sure that you do not overload your schedule.  Medicines Your health care provider may suggest certain medicines if he or she feels that they will help to improve your condition. Antidepressants or antipsychotic medicines may be used to treat PTSD. Avoid using alcohol and other substances that may prevent your medicines from working properly (may interact). It is also important to:  Talk with your pharmacist or health care  provider about all medicines that you take, their possible side effects, and which medicines are safe to take together.  Make it your goal to take part in all treatment decisions (shared decision-making). Ask about possible side effects of medicines that your health care provider recommends, and tell him or her how you feel about having those side effects. It is best if shared decision-making with your health care provider is part of your total treatment plan.  If your health care provider prescribes a medicine, you may not notice the full benefits of it for 4-8 weeks. Most people who are treated for PTSD need to take medicine for at least 6-12 months after they feel better. If you are taking medicines as part of your treatment, do not stop taking medicines before you ask your health care provider if it is safe to stop. You may need to have the medicine slowly decreased (tapered) over time to lower the risk of harmful side effects. Relationships Many people who have PTSD have difficulty trusting others. Make an effort to:  Take risks and develop trust with close friends and family members. Developing trust in others can help you feel safe and connect you with emotional support.  Be open and honest about your feelings.  Try to have fun and relax in safe spaces, such as with friends and family.  Think about going to couples counseling, family education classes, or family therapy. Your loved ones may not always know how to be supportive. Therapy can be helpful for everyone.  How to recognize changes in your condition Be aware of your   symptoms and how often you have them. The following symptoms mean that you need to seek help for your PTSD:  You feel suspicious and angry.  You have repeated flashbacks.  You avoid going out or being with others.  You have an increasing number of fights with close friends or family members, such as your spouse.  You have thoughts about hurting yourself or  others.  You cannot get relief from feelings of depression or anxiety.  Where to find support Talking to others  Explain that PTSD is a mental health problem. It is something that a person can develop after experiencing or seeing a life-threatening event. Tell them that PTSD makes you feel stress like you did during the event.  Talk to your loved ones about the symptoms you have. Also tell them what things or situations can cause symptoms to start (are triggers for you).  Assure your loved ones that there are treatments to help PTSD. Discuss possibly seeking family therapy or couples therapy.  If you are worried or fearful about seeking treatment, ask for support. Finances Not all insurance plans cover mental health care, so it is important to check with your insurance carrier. If paying for co-pays or counseling services is a problem, search for a local or county mental health care center. Public mental health care services may be offered there at a low cost or no cost when you are not able to see a private health care provider. If you are a veteran, contact a local veterans organization or veterans hospital for more information. If you are taking medicine for PTSD, you may be able to get the genericform, which may be less expensive than brand-name medicine. Some makers of prescription medicines also offer help to patients who cannot afford the medicines that they need. Community resources  Find a support group in your community. Often, groups are available for military veterans, trauma victims, and family members or caregivers.  Look into volunteer opportunities. Taking part in these can help you feel more connected to your community.  Contact a local organization to find out if you are eligible for a service dog.  Keep daily contact with at least one trusted friend or family member. Follow these instructions at home: Lifestyle  Exercise regularly. Try to do 30 or more minutes of  physical activity on most days of the week.  Try to get 7-9 hours of sleep each night. To help with sleep: ? Keep your bedroom cool and dark. ? Do not eat a heavy meal during the hour before you go to bed. ? Do not drink alcohol or caffeinated drinks before bed. ? Avoid screen time before bedtime. This means avoiding use of your TV, computer, tablet, and cell phone.  Avoid using alcohol or drugs.  Practice self-soothing skills and use them daily.  Try to have fun and seek humor in your life. General instructions  If your PTSD is affecting your marriage or family, seek help from a family therapist.  Take over-the-counter and prescription medicines only as told by your health care provider.  Make sure to let all of your health care providers know that you have PTSD. This is especially important if you are having surgery or need to be admitted to the hospital.  Keep all follow-up visits as told by your health care providers. This is important. Where to find more information: Go to this website to find more information about PTSD, treatment for PTSD, and how to get support:  National   Center for PTSD: www.ptsd.va.gov  Contact a health care provider if:  Your symptoms get worse or they do not get better. Get help right away if:  You have thoughts about hurting yourself or others. If you ever feel like you may hurt yourself or others, or have thoughts about taking your own life, get help right away. You can go to your nearest emergency department or call:  Your local emergency services (911 in the [place]  A suicide crisis helpline, such as the National Suicide Prevention Lifeline at 1-800-273-8255. This is open 24-hours a day.  Summary  If you are living with PTSD, there are ways to help you recover from it and manage your symptoms.  Find supportive environments and people who understand PTSD. Spend time in those places, and maintain contact with those people.  Work with your  health care team to create a management plan that includes counseling, stress management techniques, and healthy lifestyle habits. This information is not intended to replace advice given to you by your health care provider. Make sure you discuss any questions you have with your health care provider. Document Released: 09/24/2016 Document Revised: 09/24/2016 Document Reviewed: 09/24/2016 Elsevier Interactive Patient Education  2018 Elsevier Inc.  

## 2017-09-09 ENCOUNTER — Other Ambulatory Visit: Payer: Self-pay

## 2017-09-09 DIAGNOSIS — M542 Cervicalgia: Secondary | ICD-10-CM | POA: Diagnosis not present
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
## 2017-09-10 ENCOUNTER — Other Ambulatory Visit: Payer: Self-pay
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
## 2017-09-13 ENCOUNTER — Other Ambulatory Visit: Payer: Self-pay

## 2017-09-13 ENCOUNTER — Ambulatory Visit (INDEPENDENT_AMBULATORY_CARE_PROVIDER_SITE_OTHER): Payer: PPO | Admitting: Nurse Practitioner

## 2017-09-13 ENCOUNTER — Encounter: Payer: Self-pay | Admitting: Nurse Practitioner

## 2017-09-13 ENCOUNTER — Telehealth: Payer: Self-pay | Admitting: Nurse Practitioner

## 2017-09-13 ENCOUNTER — Ambulatory Visit: Payer: Self-pay

## 2017-09-13 VITALS — BP 142/96 | HR 81 | Temp 98.0°F | Ht 64.0 in | Wt 158.6 lb

## 2017-09-13 DIAGNOSIS — Z4802 Encounter for removal of sutures: Secondary | ICD-10-CM | POA: Diagnosis not present
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
## 2017-09-13 NOTE — Telephone Encounter (Signed)
Patient wants to come by today to have staples removed. Saw MMM last week and she told her to come back today. Gave patient a time to come in and she verbalized understanding.
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
## 2017-09-27 DIAGNOSIS — S22000A Wedge compression fracture of unspecified thoracic vertebra, initial encounter for closed fracture: Secondary | ICD-10-CM | POA: Insufficient documentation

## 2017-09-27 DIAGNOSIS — M546 Pain in thoracic spine: Secondary | ICD-10-CM | POA: Insufficient documentation

## 2017-09-27 DIAGNOSIS — M4854XD Collapsed vertebra, not elsewhere classified, thoracic region, subsequent encounter for fracture with routine healing: Secondary | ICD-10-CM | POA: Diagnosis not present

## 2017-10-10 ENCOUNTER — Other Ambulatory Visit: Payer: Self-pay | Admitting: Pediatrics

## 2017-10-10 DIAGNOSIS — R569 Unspecified convulsions: Secondary | ICD-10-CM

## 2017-10-25 DIAGNOSIS — M546 Pain in thoracic spine: Secondary | ICD-10-CM | POA: Diagnosis not present

## 2017-10-27 ENCOUNTER — Ambulatory Visit: Payer: PPO | Admitting: Physical Therapy

## 2017-10-29 ENCOUNTER — Encounter: Payer: Self-pay | Admitting: Physical Therapy

## 2017-10-29 ENCOUNTER — Ambulatory Visit: Payer: PPO | Attending: Orthopedic Surgery | Admitting: Physical Therapy

## 2017-10-29 ENCOUNTER — Other Ambulatory Visit: Payer: Self-pay

## 2017-10-29 DIAGNOSIS — M546 Pain in thoracic spine: Secondary | ICD-10-CM | POA: Diagnosis not present

## 2017-10-29 DIAGNOSIS — M542 Cervicalgia: Secondary | ICD-10-CM | POA: Insufficient documentation
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
## 2017-11-03 ENCOUNTER — Other Ambulatory Visit: Payer: Self-pay | Admitting: Nurse Practitioner

## 2017-11-03 DIAGNOSIS — F4310 Post-traumatic stress disorder, unspecified: Secondary | ICD-10-CM

## 2017-11-03 DIAGNOSIS — R6884 Jaw pain: Secondary | ICD-10-CM

## 2017-11-05 ENCOUNTER — Ambulatory Visit: Payer: PPO | Admitting: Physical Therapy

## 2017-11-05 DIAGNOSIS — M542 Cervicalgia: Secondary | ICD-10-CM

## 2017-11-05 DIAGNOSIS — M546 Pain in thoracic spine: Secondary | ICD-10-CM | POA: Diagnosis not present
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
## 2017-11-12 ENCOUNTER — Ambulatory Visit: Payer: PPO | Attending: Orthopedic Surgery | Admitting: Physical Therapy

## 2017-11-12 ENCOUNTER — Encounter: Payer: Self-pay | Admitting: Physical Therapy

## 2017-11-12 DIAGNOSIS — M542 Cervicalgia: Secondary | ICD-10-CM | POA: Insufficient documentation

## 2017-11-12 DIAGNOSIS — M546 Pain in thoracic spine: Secondary | ICD-10-CM | POA: Diagnosis not present

## 2017-11-12 NOTE — Therapy (Signed)
[facility] [address] [place], [place], [address] Phone: [phone]   Fax:  [phone]  Physical Therapy Treatment  Patient Details  Name: Cheryl Reeves MRN: [id number] Date of Birth: [birth year] Referring Provider: Venita Lickahari Brooks, MD   Encounter Date: 11/12/2017  PT End of Session - 11/12/17 1127    Visit Number  3    Number of Visits  12    Date for PT Re-Evaluation  12/10/17    PT Start Time  0948    PT Stop Time  1040    PT Time Calculation (min)  52 min    Activity Tolerance  Patient tolerated treatment well    Behavior During Therapy  [facility] for tasks assessed/performed       Past Medical History:  Diagnosis Date  . Allergy   . DJD (degenerative joint disease)   . GERD (gastroesophageal reflux disease)   . Stroke [facility] 1999   S/p childbirth-weakness rt leg  . Thoracic injuries   . Wears glasses     Past Surgical History:  Procedure Laterality Date  . ARTHROSCOPY KNEE W/ DRILLING  2008   rt  . REPLACEMENT TOTAL KNEE Right 2009  . SEPTOPLASTY Bilateral 07/21/2013   Procedure: SEPTOPLASTY;  Surgeon: Suzanna ObeyJohn Byers, MD;  Location: [facility];  Service: ENT;  Laterality: Bilateral;  . SINUS ENDO W/FUSION Bilateral 07/21/2013   Procedure: ENDOSCOPIC SINUS SURGERY WITH FUSION NAVIGATION;  Surgeon: Suzanna ObeyJohn Byers, MD;  Location: [facility];  Service: ENT;  Laterality: Bilateral;  . TUBAL LIGATION  2000    There were no vitals filed for this visit.  Subjective Assessment - 11/12/17 1130    Subjective  That last treatment helped a lot.    Currently in Pain?  Yes    Pain Score  2     Pain Orientation  Right    Pain Type  Acute pain    Pain Onset  1 to 4 weeks ago                       [facility] Treatment/Exercise - 11/12/17 0001      Modalities   Modalities  Electrical Stimulation      Electrical Stimulation   Electrical Stimulation Location  Bilateral UT/upper  thoracic region.    Electrical Stimulation Action  80-150 hz x 20 minutes.    Electrical Stimulation Goals  Pain      Manual Therapy   Manual Therapy  Soft tissue mobilization    Manual therapy comments  STW/M and bilateral UT's including release and ischemic release technique to affected musculature x 23 minutes.                  PT Long Term Goals - 11/05/17 1251      PT LONG TERM GOAL #1   Title  Patient will be independent with HEP    Time  6    Period  Weeks    Status  New      PT LONG TERM GOAL #2   Title  Patient will perform ADLs with less than 3/10 pain in upper thoracic and cervical spine.    Time  6    Period  Weeks    Status  New      PT LONG TERM GOAL #3   Title  Patient will report ability to sleep greater than 5 hours per night without waking up secondary to pain.  Time  6    Period  Weeks    Status  New      PT LONG TERM GOAL #4   Title  Patient will improve cervical rotation AROM to 60+ degrees to adequately scan environment.    Time  6    Period  Weeks            Plan - 11/12/17 1141    Clinical Impression Statement  Patient did great today with STW/M feeling great after treatment.    PT Treatment/Interventions  Cryotherapy;Electrical Stimulation;Ultrasound;Moist Heat;Therapeutic activities;Therapeutic exercise;Patient/family education;Neuromuscular re-education;Manual techniques;Passive range of motion;Dry needling;Taping;Vasopneumatic Device    PT Next Visit Plan  postural exercises, cervical ROM, modalities PRN for pain relief    Consulted and Agree with Plan of Care  Patient       Patient will benefit from skilled therapeutic intervention in order to improve the following deficits and impairments:  Pain, Decreased activity tolerance, Decreased range of motion, Impaired UE functional use  Visit Diagnosis: Pain in thoracic spine  Cervicalgia     Problem List Patient Active Problem List   Diagnosis Date Noted  . Carpal  tunnel syndrome of left wrist 10/16/2016  . Migraines 06/12/2015  . Allergic rhinitis 10/09/2013  . GERD (gastroesophageal reflux disease) 10/09/2013  . H/O: CVA (cerebrovascular accident) 10/09/2013  . Foot drop, right 10/09/2013  . Rosacea, acne 10/09/2013  . Seizures (HCC) 10/09/2013    Judythe Postema, [place] MPT 11/12/2017, 11:42 AM  [facility] [address] [place], [place], [address] Phone: [phone]   Fax:  [phone]  Name: Cheryl Reeves MRN: [id number] Date of Birth: 10/28/1970

## 2017-11-19 ENCOUNTER — Ambulatory Visit: Payer: PPO | Admitting: *Deleted

## 2017-11-19 DIAGNOSIS — M546 Pain in thoracic spine: Secondary | ICD-10-CM

## 2017-11-19 DIAGNOSIS — M542 Cervicalgia: Secondary | ICD-10-CM

## 2017-11-19 NOTE — Therapy (Signed)
[facility] [address] [place], [place], [address] Phone: [phone]   Fax:  [phone]  Physical Therapy Treatment  Patient Details  Name: Cheryl Reeves MRN: [id number] Date of Birth: 17-Mar-1971 Referring Provider: Venita Lickahari Brooks, MD   Encounter Date: 11/19/2017  PT End of Session - 11/19/17 1024    Visit Number  4    Number of Visits  12    Date for PT Re-Evaluation  12/10/17    PT Start Time  0945    PT Stop Time  1035    PT Time Calculation (min)  50 min       Past Medical History:  Diagnosis Date  . Allergy   . DJD (degenerative joint disease)   . GERD (gastroesophageal reflux disease)   . Stroke [facility](H[facility]) 1999   S/p childbirth-weakness rt leg  . Thoracic injuries   . Wears glasses     Past Surgical History:  Procedure Laterality Date  . ARTHROSCOPY KNEE W/ DRILLING  2008   rt  . REPLACEMENT TOTAL KNEE Right 2009  . SEPTOPLASTY Bilateral 07/21/2013   Procedure: SEPTOPLASTY;  Surgeon: Suzanna ObeyJohn Byers, MD;  Location: [facility];  Service: ENT;  Laterality: Bilateral;  . SINUS ENDO W/FUSION Bilateral 07/21/2013   Procedure: ENDOSCOPIC SINUS SURGERY WITH FUSION NAVIGATION;  Surgeon: Suzanna ObeyJohn Byers, MD;  Location: [facility];  Service: ENT;  Laterality: Bilateral;  . TUBAL LIGATION  2000    There were no vitals filed for this visit.  Subjective Assessment - 11/19/17 1129    Subjective  Did ok after last Rx , but stressed and painful today    Limitations  Lifting;House hold activities    Currently in Pain?  Yes    Pain Score  4     Pain Location  Back    Pain Orientation  Mid    Pain Descriptors / Indicators  Burning;Aching;Sore    Pain Type  Acute pain    Pain Onset  1 to 4 weeks ago    Pain Frequency  Intermittent                       [facility] Adult PT Treatment/Exercise - 11/19/17 0001      Modalities   Modalities  Electrical Stimulation      Moist Heat Therapy   Number Minutes Moist Heat  15 Minutes    Moist Heat Location  Cervical      Electrical Stimulation   Electrical Stimulation Location  Bilateral UT/upper thoracic region. premod x 15 mins    Electrical Stimulation Goals  Pain      Manual Therapy   Manual Therapy  Soft tissue mobilization    Manual therapy comments  STW/IASTM to periscapular  and bilateral UT's including release and ischemic release technique to affected musculature x 23 minutes.                  PT Long Term Goals - 11/05/17 1251      PT LONG TERM GOAL #1   Title  Patient will be independent with HEP    Time  6    Period  Weeks    Status  New      PT LONG TERM GOAL #2   Title  Patient will perform ADLs with less than 3/10 pain in upper thoracic and cervical spine.    Time  6    Period  Weeks    Status  New  PT LONG TERM GOAL #3   Title  Patient will report ability to sleep greater than 5 hours per night without waking up secondary to pain.    Time  6    Period  Weeks    Status  New      PT LONG TERM GOAL #4   Title  Patient will improve cervical rotation AROM to 60+ degrees to adequately scan environment.    Time  6    Period  Weeks            Plan - 11/19/17 1123    Clinical Impression Statement  Pt arrived today with increased pain in Bil UT's and midback/ periscapular pain. She did well with STW and IASTM, but with notable tenderness periscapular musculature. Did well with TPR to Bil UT.'s and cervical rotation has improved to 60 degrees each way , but still tight and painful    Rehab Potential  Good    PT Treatment/Interventions  Cryotherapy;Electrical Stimulation;Ultrasound;Moist Heat;Therapeutic activities;Therapeutic exercise;Patient/family education;Neuromuscular re-education;Manual techniques;Passive range of motion;Dry needling;Taping;Vasopneumatic Device    PT Next Visit Plan  postural exercises, cervical ROM, modalities PRN for pain relief    Consulted and Agree with Plan of  Care  Patient       Patient will benefit from skilled therapeutic intervention in order to improve the following deficits and impairments:  Pain, Decreased activity tolerance, Decreased range of motion, Impaired UE functional use  Visit Diagnosis: Pain in thoracic spine  Cervicalgia     Problem List Patient Active Problem List   Diagnosis Date Noted  . Carpal tunnel syndrome of left wrist 10/16/2016  . Migraines 06/12/2015  . Allergic rhinitis 10/09/2013  . GERD (gastroesophageal reflux disease) 10/09/2013  . H/O: CVA (cerebrovascular accident) 10/09/2013  . Foot drop, right 10/09/2013  . Rosacea, acne 10/09/2013  . Seizures (HCC) 10/09/2013    Cheryl Reeves,Cheryl Reeves,Cheryl Reeves 11/19/2017, 11:31 AM  [facility] [address] [place], [place], [address] Phone: [phone]   Fax:  [phone]  Name: Cheryl Reeves MRN: [id number] Date of Birth: August 29, 1970

## 2017-11-22 ENCOUNTER — Other Ambulatory Visit: Payer: Self-pay | Admitting: Pediatrics

## 2017-11-22 ENCOUNTER — Other Ambulatory Visit: Payer: Self-pay | Admitting: Nurse Practitioner

## 2017-11-22 DIAGNOSIS — F411 Generalized anxiety disorder: Secondary | ICD-10-CM

## 2017-11-22 DIAGNOSIS — M546 Pain in thoracic spine: Secondary | ICD-10-CM | POA: Diagnosis not present

## 2017-11-22 DIAGNOSIS — I10 Essential (primary) hypertension: Secondary | ICD-10-CM

## 2017-11-26 ENCOUNTER — Encounter: Payer: PPO | Admitting: *Deleted

## 2017-11-29 ENCOUNTER — Other Ambulatory Visit: Payer: Self-pay | Admitting: Nurse Practitioner

## 2017-11-29 DIAGNOSIS — I6930 Unspecified sequelae of cerebral infarction: Secondary | ICD-10-CM

## 2017-12-03 ENCOUNTER — Encounter: Payer: PPO | Admitting: Physical Therapy

## 2017-12-06 ENCOUNTER — Encounter: Payer: Self-pay | Admitting: Physical Therapy

## 2017-12-06 ENCOUNTER — Ambulatory Visit: Payer: PPO | Attending: Orthopedic Surgery | Admitting: Physical Therapy

## 2017-12-06 DIAGNOSIS — M546 Pain in thoracic spine: Secondary | ICD-10-CM | POA: Diagnosis not present

## 2017-12-06 DIAGNOSIS — M542 Cervicalgia: Secondary | ICD-10-CM | POA: Insufficient documentation

## 2017-12-06 NOTE — Therapy (Signed)
[facility] [place], [place], [address] Phone: [phone]   Fax:  [phone]  Physical Therapy Treatment  Patient Details  Name: Cheryl Reeves MRN: [id number] Date of Birth: 1970/06/26 Referring Provider: Melina Schools, MD   Encounter Date: 12/06/2017  PT End of Session - 12/06/17 1422    Visit Number  5    Number of Visits  12    Date for PT Re-Evaluation  01/14/18    PT Start Time  1346    PT Stop Time  1432    PT Time Calculation (min)  46 min    Activity Tolerance  Patient tolerated treatment well    Behavior During Therapy  [facility] for tasks assessed/performed       Past Medical History:  Diagnosis Date  . Allergy   . DJD (degenerative joint disease)   . GERD (gastroesophageal reflux disease)   . Stroke [facility]) 1999   S/p childbirth-weakness rt leg  . Thoracic injuries   . Wears glasses     Past Surgical History:  Procedure Laterality Date  . ARTHROSCOPY KNEE W/ DRILLING  2008   rt  . REPLACEMENT TOTAL KNEE Right 2009  . SEPTOPLASTY Bilateral 07/21/2013   Procedure: SEPTOPLASTY;  Surgeon: Melissa Montane, MD;  Location: [facility];  Service: ENT;  Laterality: Bilateral;  . SINUS ENDO W/FUSION Bilateral 07/21/2013   Procedure: ENDOSCOPIC SINUS SURGERY WITH FUSION NAVIGATION;  Surgeon: Melissa Montane, MD;  Location: [facility];  Service: ENT;  Laterality: Bilateral;  . TUBAL LIGATION  2000    There were no vitals filed for this visit.  Subjective Assessment - 12/06/17 1351    Subjective  ongoing pain today per patient    Limitations  Lifting;House hold activities    Currently in Pain?  Yes    Pain Score  6     Pain Location  -- cervical and thoracic    Pain Orientation  Mid;Upper    Pain Descriptors / Indicators  Aching;Discomfort    Pain Type  Acute pain    Pain Onset  More than a month ago    Pain Frequency  Intermittent    Aggravating Factors   increased activity    Pain  Relieving Factors  meds         [facility] PT Assessment - 12/06/17 0001      AROM   AROM Assessment Site  Cervical    Cervical - Right Rotation  70    Cervical - Left Rotation  65                   [facility] Adult PT Treatment/Exercise - 12/06/17 0001      Modalities   Modalities  Electrical Stimulation;Moist Heat;Ultrasound      Moist Heat Therapy   Number Minutes Moist Heat  15 Minutes    Moist Heat Location  Cervical      Electrical Stimulation   Electrical Stimulation Location  Bilateral UT/upper thoracic region. premod x 15 mins    Electrical Stimulation Goals  Pain      Ultrasound   Ultrasound Location  bil cervical paraspinals /UT    Ultrasound Parameters  1.5w/cm2/100%/26mz x124m    Ultrasound Goals  Pain      Manual Therapy   Manual Therapy  Soft tissue mobilization;Myofascial release    Manual therapy comments  manual STW to bil  cervical paraspinals, UT and mid thoracic and scapular area to reduce pain and tone  PT Long Term Goals - 12/06/17 1419      PT LONG TERM GOAL #1   Title  Patient will be independent with HEP    Time  6    Period  Weeks    Status  On-going      PT LONG TERM GOAL #2   Title  Patient will perform ADLs with less than 3/10 pain in upper thoracic and cervical spine.    Time  6    Period  Weeks    Status  On-going      PT LONG TERM GOAL #3   Title  Patient will report ability to sleep greater than 5 hours per night without waking up secondary to pain.    Time  6    Period  Weeks    Status  On-going      PT LONG TERM GOAL #4   Title  Patient will improve cervical rotation AROM to 60+ degrees to adequately scan environment.    Time  6    Period  Weeks    Status  Achieved 12/06/17            Plan - 12/06/17 1425    Clinical Impression Statement  Patient arrived with ongoing pain in cervical and thoracic spine. Patient has reported missing therapy and not doing HEP due to father being sick  and having a lot of stress currently. Today focused on modalities and manual STW to help decrease pain and tone in muscles. Patient felt better after treatment today and able to perform AROM in cervical rotation for full ROM and met goals today,other goals ongoing at this time.    Rehab Potential  Good    PT Frequency  2x / week    PT Duration  6 weeks    PT Treatment/Interventions  Cryotherapy;Electrical Stimulation;Ultrasound;Moist Heat;Therapeutic activities;Therapeutic exercise;Patient/family education;Neuromuscular re-education;Manual techniques;Passive range of motion;Dry needling;Taping;Vasopneumatic Device    PT Next Visit Plan  postural exercises, cervical ROM, modalities PRN for pain relief    Consulted and Agree with Plan of Care  Patient       Patient will benefit from skilled therapeutic intervention in order to improve the following deficits and impairments:  Pain, Decreased activity tolerance, Decreased range of motion, Impaired UE functional use  Visit Diagnosis: Pain in thoracic spine  Cervicalgia     Problem List Patient Active Problem List   Diagnosis Date Noted  . Carpal tunnel syndrome of left wrist 10/16/2016  . Migraines 06/12/2015  . Allergic rhinitis 10/09/2013  . GERD (gastroesophageal reflux disease) 10/09/2013  . H/O: CVA (cerebrovascular accident) 10/09/2013  . Foot drop, right 10/09/2013  . Rosacea, acne 10/09/2013  . Seizures ([place]) 10/09/2013    Ladean Raya, PTA 12/06/17 4:29 PM  [facility] [place], [place], [address] Phone: [phone]   Fax:  [phone]  Name: Cheryl Reeves MRN: [id number] Date of Birth: 1971/05/15

## 2017-12-13 ENCOUNTER — Ambulatory Visit: Payer: PPO | Admitting: Physical Therapy

## 2017-12-13 DIAGNOSIS — M546 Pain in thoracic spine: Secondary | ICD-10-CM | POA: Diagnosis not present

## 2017-12-13 DIAGNOSIS — M542 Cervicalgia: Secondary | ICD-10-CM

## 2017-12-13 NOTE — Therapy (Signed)
[facility] [address] [place], [place], [address] Phone: [phone]   Fax:  [phone]  Physical Therapy Treatment  Patient Details  Name: Cheryl Reeves MRN: [id number] Date of Birth: Sep 16, 1970 Referring Provider: Venita Lick, MD   Encounter Date: 12/13/2017  PT End of Session - 12/13/17 1230    Visit Number  6    Number of Visits  12    Date for PT Re-Evaluation  01/14/18    PT Start Time  0950    PT Stop Time  1041    PT Time Calculation (min)  51 min    Activity Tolerance  Patient tolerated treatment well    Behavior During Therapy  [facility] for tasks assessed/performed       Past Medical History:  Diagnosis Date  . Allergy   . DJD (degenerative joint disease)   . GERD (gastroesophageal reflux disease)   . Stroke [facility]) 1999   S/p childbirth-weakness rt leg  . Thoracic injuries   . Wears glasses     Past Surgical History:  Procedure Laterality Date  . ARTHROSCOPY KNEE W/ DRILLING  2008   rt  . REPLACEMENT TOTAL KNEE Right 2009  . SEPTOPLASTY Bilateral 07/21/2013   Procedure: SEPTOPLASTY;  Surgeon: Suzanna Obey, MD;  Location: [facility];  Service: ENT;  Laterality: Bilateral;  . SINUS ENDO W/FUSION Bilateral 07/21/2013   Procedure: ENDOSCOPIC SINUS SURGERY WITH FUSION NAVIGATION;  Surgeon: Suzanna Obey, MD;  Location: [facility];  Service: ENT;  Laterality: Bilateral;  . TUBAL LIGATION  2000    There were no vitals filed for this visit.  Subjective Assessment - 12/13/17 1235    Subjective  That last treatment helped.    Currently in Pain?  Yes    Pain Score  2     Pain Orientation  Mid;Upper    Pain Descriptors / Indicators  Aching;Discomfort    Pain Type  Acute pain    Pain Onset  More than a month ago                       [facility] Adult PT Treatment/Exercise - 12/13/17 0001      Modalities   Modalities  Electrical Stimulation;Moist Heat;Ultrasound      Moist  Heat Therapy   Number Minutes Moist Heat  20 Minutes    Moist Heat Location  Cervical      Electrical Stimulation   Electrical Stimulation Location  Bilateral UT's/Rhomoids.    Electrical Stimulation Action  80-150 Hz x 20 minutes.    Electrical Stimulation Goals  Pain      Ultrasound   Ultrasound Location  Bil cerv/thoracic    Ultrasound Parameters  1.50 w/CM2 x 12 minutes.    Ultrasound Goals  Pain      Manual Therapy   Manual Therapy  Soft tissue mobilization    Manual therapy comments  Manual STW/M to bilateral UT's and Rhomboids x 11 minutes.                  PT Long Term Goals - 12/06/17 1419      PT LONG TERM GOAL #1   Title  Patient will be independent with HEP    Time  6    Period  Weeks    Status  On-going      PT LONG TERM GOAL #2   Title  Patient will perform ADLs with less than 3/10 pain in upper  thoracic and cervical spine.    Time  6    Period  Weeks    Status  On-going      PT LONG TERM GOAL #3   Title  Patient will report ability to sleep greater than 5 hours per night without waking up secondary to pain.    Time  6    Period  Weeks    Status  On-going      PT LONG TERM GOAL #4   Title  Patient will improve cervical rotation AROM to 60+ degrees to adequately scan environment.    Time  6    Period  Weeks    Status  Achieved 12/06/17            Plan - 12/13/17 1238    Clinical Impression Statement  Excellent response to treatment today with no pain reported after treatment.    PT Treatment/Interventions  Cryotherapy;Electrical Stimulation;Ultrasound;Moist Heat;Therapeutic activities;Therapeutic exercise;Patient/family education;Neuromuscular re-education;Manual techniques;Passive range of motion;Dry needling;Taping;Vasopneumatic Device    PT Next Visit Plan  postural exercises, cervical ROM, modalities PRN for pain relief    Consulted and Agree with Plan of Care  Patient       Patient will benefit from skilled therapeutic  intervention in order to improve the following deficits and impairments:  Pain, Decreased activity tolerance, Decreased range of motion, Impaired UE functional use  Visit Diagnosis: Pain in thoracic spine  Cervicalgia     Problem List Patient Active Problem List   Diagnosis Date Noted  . Carpal tunnel syndrome of left wrist 10/16/2016  . Migraines 06/12/2015  . Allergic rhinitis 10/09/2013  . GERD (gastroesophageal reflux disease) 10/09/2013  . H/O: CVA (cerebrovascular accident) 10/09/2013  . Foot drop, right 10/09/2013  . Rosacea, acne 10/09/2013  . Seizures (HCC) 10/09/2013    Cheryl Reeves, [name] 12/13/2017, 12:39 PM  [facility] [address] [place], [place], [address] Phone: [phone]   Fax:  [phone]  Name: Cheryl Reeves MRN: [id number] Date of Birth: Oct 15, 1970

## 2017-12-20 ENCOUNTER — Ambulatory Visit: Payer: PPO | Admitting: Physical Therapy

## 2017-12-20 DIAGNOSIS — M542 Cervicalgia: Secondary | ICD-10-CM

## 2017-12-20 DIAGNOSIS — M546 Pain in thoracic spine: Secondary | ICD-10-CM | POA: Diagnosis not present

## 2017-12-20 NOTE — Patient Instructions (Addendum)
Trigger Point Dry Needling  . What is Trigger Point Dry Needling (DN)? o DN is a physical therapy technique used to treat muscle pain and dysfunction. Specifically, DN helps deactivate muscle trigger points (muscle knots).  o A thin filiform needle is used to penetrate the skin and stimulate the underlying trigger point. The goal is for a local twitch response (LTR) to occur and for the trigger point to relax. No medication of any kind is injected during the procedure.   . What Does Trigger Point Dry Needling Feel Like?  o The procedure feels different for each individual patient. Some patients report that they do not actually feel the needle enter the skin and overall the process is not painful. Very mild bleeding may occur. However, many patients feel a deep cramping in the muscle in which the needle was inserted. This is the local twitch response.   Marland Kitchen. How Will I feel after the treatment? o Soreness is normal, and the onset of soreness may not occur for a few hours. Typically this soreness does not last longer than two days.  o Bruising is uncommon, however; ice can be used to decrease any possible bruising.  o In rare cases feeling tired or nauseous after the treatment is normal. In addition, your symptoms may get worse before they get better, this period will typically not last longer than 24 hours.   . What Can I do After My Treatment? o Increase your hydration by drinking more water for the next 24 hours. o You may place ice or heat on the areas treated that have become sore, however, do not use heat on inflamed or bruised areas. Heat often brings more relief post needling. o You can continue your regular activities, but vigorous activity is not recommended initially after the treatment for 24 hours. o DN is best combined with other physical therapy such as strengthening, stretching, and other therapies.    Precautions:  In some cases, dry needling is done over the lung field. While rare,  there is a risk of pneumothorax (punctured lung). Because of this, if you ever experience shortness of breath on exertion, difficulty taking a deep breath, chest pain or a dry cough following dry needling, you should report to an emergency room and tell them that you have been dry needled over the thorax.   Thoracic Self-Mobilization (Sitting)   With small rolled towel at lower ribs level, gently lean back until stretch is felt. Hold ____ seconds. Relax. Repeat ____ times per set. Do ____ sets per session. Do ____ sessions per day.  http://orth.exer.us/998   Copyright  VHI. All rights reserved.  Thoracic Self-Mobilization Stretch (Supine)   With small rolled towel at lower ribs level, gently lie back until stretch is felt. Hold _to tolerance___ seconds. Relax. Repeat ____ times per set. Do ____ sets per session. Do __2__ sessions per day.  http://orth.exer.us/994   Copyright  VHI. All rights reserved.    Solon PalmJulie Evia Goldsmith, PT 12/20/17 9:57 AM [facility] [address] [place], [place], [address] Phone: [phone]   Fax:  [phone]

## 2017-12-20 NOTE — Therapy (Signed)
[facility] [address] [place], [place], [address] Phone: [phone]   Fax:  [phone]  Physical Therapy Treatment  Patient Details  Name: Cheryl Reeves MRN: [id number] Date of Birth: September 15, 1970 Referring Provider: Venita Lickahari Brooks, MD   Encounter Date: 12/20/2017  PT End of Session - 12/20/17 0948    Visit Number  7    Number of Visits  12    Date for PT Re-Evaluation  01/14/18    PT Start Time  0948    PT Stop Time  1028    PT Time Calculation (min)  40 min    Activity Tolerance  Patient tolerated treatment well    Behavior During Therapy  [facility] for tasks assessed/performed       Past Medical History:  Diagnosis Date  . Allergy   . DJD (degenerative joint disease)   . GERD (gastroesophageal reflux disease)   . Stroke [facility](HCC) 1999   S/p childbirth-weakness rt leg  . Thoracic injuries   . Wears glasses     Past Surgical History:  Procedure Laterality Date  . ARTHROSCOPY KNEE W/ DRILLING  2008   rt  . REPLACEMENT TOTAL KNEE Right 2009  . SEPTOPLASTY Bilateral 07/21/2013   Procedure: SEPTOPLASTY;  Surgeon: Suzanna ObeyJohn Byers, MD;  Location: [facility];  Service: ENT;  Laterality: Bilateral;  . SINUS ENDO W/FUSION Bilateral 07/21/2013   Procedure: ENDOSCOPIC SINUS SURGERY WITH FUSION NAVIGATION;  Surgeon: Suzanna ObeyJohn Byers, MD;  Location: [facility];  Service: ENT;  Laterality: Bilateral;  . TUBAL LIGATION  2000    There were no vitals filed for this visit.  Subjective Assessment - 12/20/17 0948    Subjective  Patient states her pain level today is 2/10 versus 5/10 yesterday.    Currently in Pain?  Yes    Pain Score  2     Pain Location  Back    Pain Orientation  Mid;Upper    Pain Descriptors / Indicators  Aching;Discomfort    Pain Type  Acute pain                       [facility] Adult PT Treatment/Exercise - 12/20/17 0001      Exercises   Exercises  Neck      Neck Exercises:  Theraband   Scapula Retraction  5 reps      Neck Exercises: Supine   Other Supine Exercise  thoracic extension over bolster      Modalities   Modalities  Ultrasound      Ultrasound   Ultrasound Location  Bil UT and mid thoracic paraspinals    Ultrasound Parameters  1.5 W/cm2 1.0 mHz cont x 12 min    Ultrasound Goals  Pain      Manual Therapy   Manual Therapy  Soft tissue mobilization    Soft tissue mobilization  to Bil UT and thoracic paraspinals       Trigger Point Dry Needling - 12/20/17 1032    Consent Given?  Yes    Education Handout Provided  Yes    Muscles Treated Upper Body  Upper trapezius    Upper Trapezius Response  Twitch reponse elicited;Palpable increased muscle length           PT Education - 12/20/17 1034    Education provided  Yes    Education Details  HEP; DN education and aftercare; thoracic extension when seated; use of towel roll in low back to improve posture; postural  ed (modifying placement of phone/book)    Person(s) Educated  Patient    Methods  Explanation;Demonstration;Handout    Comprehension  Verbalized understanding;Returned demonstration          PT Long Term Goals - 12/20/17 1038      PT LONG TERM GOAL #1   Title  Patient will be independent with HEP    Baseline  patient reports non-compliance due to stress with caregiving for father.    Time  6    Period  Weeks    Status  On-going      PT LONG TERM GOAL #2   Title  Patient will perform ADLs with less than 3/10 pain in upper thoracic and cervical spine.    Time  6    Status  On-going      PT LONG TERM GOAL #3   Title  Patient will report ability to sleep greater than 5 hours per night without waking up secondary to pain.    Time  6    Period  Weeks    Status  On-going            Plan - 12/20/17 1035    Clinical Impression Statement  Patient gave consent to DN and responded very well especially in L UT. She did experience increased sweating immediately following,  but that resolved quickly.     PT Frequency  2x / week    PT Duration  6 weeks    PT Treatment/Interventions  Cryotherapy;Electrical Stimulation;Ultrasound;Moist Heat;Therapeutic activities;Therapeutic exercise;Patient/family education;Neuromuscular re-education;Manual techniques;Passive range of motion;Dry needling;Taping;Vasopneumatic Device    PT Next Visit Plan  Assess DN. Continue along T-spine. Continue with postural/upper back strengthening. modalities prn.    PT Home Exercise Plan  thoracic extension, scap retraction    Consulted and Agree with Plan of Care  Patient       Patient will benefit from skilled therapeutic intervention in order to improve the following deficits and impairments:  Pain, Decreased activity tolerance, Decreased range of motion, Impaired UE functional use  Visit Diagnosis: Pain in thoracic spine  Cervicalgia     Problem List Patient Active Problem List   Diagnosis Date Noted  . Carpal tunnel syndrome of left wrist 10/16/2016  . Migraines 06/12/2015  . Allergic rhinitis 10/09/2013  . GERD (gastroesophageal reflux disease) 10/09/2013  . H/O: CVA (cerebrovascular accident) 10/09/2013  . Foot drop, right 10/09/2013  . Rosacea, acne 10/09/2013  . Seizures (HCC) 10/09/2013    Quintavious Rinck PT 12/20/2017, 10:41 AM  [facility] [address] [place], [place], [address] Phone: [phone]   Fax:  [phone]  Name: Cheryl Reeves MRN: [id number] Date of Birth: 02/26/1971

## 2017-12-27 ENCOUNTER — Encounter: Payer: Self-pay | Admitting: Physical Therapy

## 2017-12-27 ENCOUNTER — Ambulatory Visit: Payer: PPO | Admitting: Physical Therapy

## 2017-12-27 DIAGNOSIS — M546 Pain in thoracic spine: Secondary | ICD-10-CM

## 2017-12-27 DIAGNOSIS — M542 Cervicalgia: Secondary | ICD-10-CM

## 2017-12-27 NOTE — Therapy (Signed)
[facility] [place], [place], [address] Phone: [phone]   Fax:  [phone]  Physical Therapy Treatment  Patient Details  Name: Cheryl Reeves MRN: [id number] Date of Birth: Sep 02, 1970 Referring Provider: Melina Schools, MD   Encounter Date: 12/27/2017  PT End of Session - 12/27/17 1025    Visit Number  8    Number of Visits  12    Date for PT Re-Evaluation  01/14/18    PT Start Time  0945    PT Stop Time  1033    PT Time Calculation (min)  48 min    Activity Tolerance  Patient tolerated treatment well    Behavior During Therapy  [facility] for tasks assessed/performed       Past Medical History:  Diagnosis Date  . Allergy   . DJD (degenerative joint disease)   . GERD (gastroesophageal reflux disease)   . Stroke [facility]) 1999   S/p childbirth-weakness rt leg  . Thoracic injuries   . Wears glasses     Past Surgical History:  Procedure Laterality Date  . ARTHROSCOPY KNEE W/ DRILLING  2008   rt  . REPLACEMENT TOTAL KNEE Right 2009  . SEPTOPLASTY Bilateral 07/21/2013   Procedure: SEPTOPLASTY;  Surgeon: Melissa Montane, MD;  Location: [place];  Service: ENT;  Laterality: Bilateral;  . SINUS ENDO W/FUSION Bilateral 07/21/2013   Procedure: ENDOSCOPIC SINUS SURGERY WITH FUSION NAVIGATION;  Surgeon: Melissa Montane, MD;  Location: [place];  Service: ENT;  Laterality: Bilateral;  . TUBAL LIGATION  2000    There were no vitals filed for this visit.  Subjective Assessment - 12/27/17 0948    Subjective  Patient tolerated last treatment well, doing ok today    Limitations  Lifting;House hold activities    Currently in Pain?  Yes    Pain Score  -- 1/2    Pain Location  Back    Pain Orientation  Mid;Upper    Pain Type  Acute pain    Pain Onset  More than a month ago    Pain Frequency  Intermittent    Aggravating Factors   increased activity    Pain Relieving Factors  meds                        [facility] Adult PT Treatment/Exercise - 12/27/17 0001      Moist Heat Therapy   Number Minutes Moist Heat  15 Minutes    Moist Heat Location  Cervical      Electrical Stimulation   Electrical Stimulation Location  Bilateral UT's/Rhomoids.    Electrical Stimulation Action  80-_0  x98mn    Electrical Stimulation Goals  Pain      Ultrasound   Ultrasound Location  bil UT/mid thoracic    Ultrasound Parameters  1.5w/cm2/100%/154m x 1228m   Ultrasound Goals  Pain      Manual Therapy   Manual Therapy  Soft tissue mobilization;Myofascial release    Soft tissue mobilization  to Bil UT and thoracic paraspinals to decrease pain and tone                  PT Long Term Goals - 12/27/17 1026      PT LONG TERM GOAL #1   Title  Patient will be independent with HEP    Baseline  patient reports non-compliance due to stress with caregiving for father.    Time  6  Period  Weeks    Status  On-going      PT LONG TERM GOAL #2   Title  Patient will perform ADLs with less than 3/10 pain in upper thoracic and cervical spine.    Time  6    Period  Weeks    Status  On-going pain up to 6-7/10 12/27/17      PT LONG TERM GOAL #3   Title  Patient will report ability to sleep greater than 5 hours per night without waking up secondary to pain.    Time  6    Period  Weeks    Status  Achieved 12/27/17      PT LONG TERM GOAL #4   Title  Patient will improve cervical rotation AROM to 60+ degrees to adequately scan environment.    Time  6    Period  Weeks    Status  Achieved            Plan - 12/27/17 1023    Clinical Impression Statement  Patient tolerated treatment well today. Patient felt relief after last DN session and has very minimal discomfort today. Patient has reported less stress the past week and has dome some HEP. Patient has been sleeping better without waking up from pain. Patient has had some increased discomfort up to 6-7/10 when  performing certain ADL's. Patient met LTG #3 today others ongoing.     Rehab Potential  Good    PT Frequency  2x / week    PT Duration  6 weeks    PT Treatment/Interventions  Cryotherapy;Electrical Stimulation;Ultrasound;Moist Heat;Therapeutic activities;Therapeutic exercise;Patient/family education;Neuromuscular re-education;Manual techniques;Passive range of motion;Dry needling;Taping;Vasopneumatic Device    PT Next Visit Plan  cont with DN. Continue along T-spine. Continue with postural/upper back strengthening. modalities prn.    Consulted and Agree with Plan of Care  Patient       Patient will benefit from skilled therapeutic intervention in order to improve the following deficits and impairments:  Pain, Decreased activity tolerance, Decreased range of motion, Impaired UE functional use  Visit Diagnosis: Pain in thoracic spine  Cervicalgia     Problem List Patient Active Problem List   Diagnosis Date Noted  . Carpal tunnel syndrome of left wrist 10/16/2016  . Migraines 06/12/2015  . Allergic rhinitis 10/09/2013  . GERD (gastroesophageal reflux disease) 10/09/2013  . H/O: CVA (cerebrovascular accident) 10/09/2013  . Foot drop, right 10/09/2013  . Rosacea, acne 10/09/2013  . Seizures ([place]) 10/09/2013    DUNFORD, CHRISTINA P, PTA 12/27/2017, 10:35 AM  [facility] [place], [place], [address] Phone: [phone]   Fax:  [phone]  Name: VALTA DILLON MRN: [id number] Date of Birth: 1971-02-07

## 2018-01-03 ENCOUNTER — Other Ambulatory Visit: Payer: Self-pay | Admitting: Nurse Practitioner

## 2018-01-03 ENCOUNTER — Ambulatory Visit: Payer: PPO | Admitting: Physical Therapy

## 2018-01-03 ENCOUNTER — Encounter: Payer: Self-pay | Admitting: Physical Therapy

## 2018-01-03 DIAGNOSIS — M542 Cervicalgia: Secondary | ICD-10-CM

## 2018-01-03 DIAGNOSIS — M546 Pain in thoracic spine: Secondary | ICD-10-CM | POA: Diagnosis not present

## 2018-01-03 DIAGNOSIS — F4310 Post-traumatic stress disorder, unspecified: Secondary | ICD-10-CM

## 2018-01-03 DIAGNOSIS — R6884 Jaw pain: Secondary | ICD-10-CM

## 2018-01-03 NOTE — Therapy (Signed)
[facility] [address] [place], [place], [address] Phone: [phone]   Fax:  [phone]  Physical Therapy Treatment  Patient Details  Name: Cheryl Reeves MRN: [id number] Date of Birth: 03-06-1971 Referring Provider: Venita Lick, MD   Encounter Date: 01/03/2018  PT End of Session - 01/03/18 1017    Visit Number  9    Number of Visits  12    Date for PT Re-Evaluation  01/14/18    PT Start Time  0946    PT Stop Time  1032    PT Time Calculation (min)  46 min    Activity Tolerance  Patient tolerated treatment well    Behavior During Therapy  [facility] for tasks assessed/performed       Past Medical History:  Diagnosis Date  . Allergy   . DJD (degenerative joint disease)   . GERD (gastroesophageal reflux disease)   . Stroke [facility]) 1999   S/p childbirth-weakness rt leg  . Thoracic injuries   . Wears glasses     Past Surgical History:  Procedure Laterality Date  . ARTHROSCOPY KNEE W/ DRILLING  2008   rt  . REPLACEMENT TOTAL KNEE Right 2009  . SEPTOPLASTY Bilateral 07/21/2013   Procedure: SEPTOPLASTY;  Surgeon: Suzanna Obey, MD;  Location: [facility];  Service: ENT;  Laterality: Bilateral;  . SINUS ENDO W/FUSION Bilateral 07/21/2013   Procedure: ENDOSCOPIC SINUS SURGERY WITH FUSION NAVIGATION;  Surgeon: Suzanna Obey, MD;  Location: [facility];  Service: ENT;  Laterality: Bilateral;  . TUBAL LIGATION  2000    There were no vitals filed for this visit.  Subjective Assessment - 01/03/18 0950    Subjective  Patient tolerated last treatment well, doing ok today with less discomfort overall    Limitations  Lifting;House hold activities    Currently in Pain?  Yes    Pain Score  1     Pain Location  Back    Pain Orientation  Mid;Upper    Pain Descriptors / Indicators  Aching;Discomfort    Pain Type  Acute pain    Pain Onset  More than a month ago    Pain Frequency  Intermittent    Aggravating  Factors   increased activity    Pain Relieving Factors  meds                       [facility] Adult PT Treatment/Exercise - 01/03/18 0001      Electrical Stimulation   Electrical Stimulation Location  Bilateral UT's/Rhomoids.    Electrical Stimulation Action  80-150hz  x45min    Electrical Stimulation Goals  Pain      Ultrasound   Ultrasound Location  bil UT/mid thoracic    Ultrasound Parameters  1.5w/cm2/100%/15mhz x64min    Ultrasound Goals  Pain      Manual Therapy   Manual Therapy  Soft tissue mobilization;Myofascial release    Soft tissue mobilization  to Bil UT and thoracic paraspinals to decrease pain and tone                  PT Long Term Goals - 12/27/17 1026      PT LONG TERM GOAL #1   Title  Patient will be independent with HEP    Baseline  patient reports non-compliance due to stress with caregiving for father.    Time  6    Period  Weeks    Status  On-going  PT LONG TERM GOAL #2   Title  Patient will perform ADLs with less than 3/10 pain in upper thoracic and cervical spine.    Time  6    Period  Weeks    Status  On-going pain up to 6-7/10 12/27/17      PT LONG TERM GOAL #3   Title  Patient will report ability to sleep greater than 5 hours per night without waking up secondary to pain.    Time  6    Period  Weeks    Status  Achieved 12/27/17      PT LONG TERM GOAL #4   Title  Patient will improve cervical rotation AROM to 60+ degrees to adequately scan environment.    Time  6    Period  Weeks    Status  Achieved            Plan - 01/03/18 1018    Clinical Impression Statement  Patient tolerated treatment well today. Patient has felt relief overall and decreased discomfort. Patient pain increases with prolong activity and stress. Patient has less palpable pain and decreased tightness in muscles. Goals progressing.     Rehab Potential  Good    PT Frequency  2x / week    PT Duration  6 weeks    PT Treatment/Interventions   Cryotherapy;Electrical Stimulation;Ultrasound;Moist Heat;Therapeutic activities;Therapeutic exercise;Patient/family education;Neuromuscular re-education;Manual techniques;Passive range of motion;Dry needling;Taping;Vasopneumatic Device    PT Next Visit Plan  cont with DN. Continue along T-spine. Continue with postural/upper back strengthening. modalities prn.    Consulted and Agree with Plan of Care  Patient       Patient will benefit from skilled therapeutic intervention in order to improve the following deficits and impairments:  Pain, Decreased activity tolerance, Decreased range of motion, Impaired UE functional use  Visit Diagnosis: Pain in thoracic spine  Cervicalgia     Problem List Patient Active Problem List   Diagnosis Date Noted  . Carpal tunnel syndrome of left wrist 10/16/2016  . Migraines 06/12/2015  . Allergic rhinitis 10/09/2013  . GERD (gastroesophageal reflux disease) 10/09/2013  . H/O: CVA (cerebrovascular accident) 10/09/2013  . Foot drop, right 10/09/2013  . Rosacea, acne 10/09/2013  . Seizures ([facility]) 10/09/2013    Grissel Tyrell P, PTA 01/03/2018, 10:35 AM  [facility] [address] [place], [place], [address] Phone: [phone]   Fax:  [phone]  Name: Cheryl Reeves MRN: [id number] Date of Birth: December 04, 1970

## 2018-01-04 NOTE — Telephone Encounter (Signed)
Last seen 09/13/17  MMM 

## 2018-01-10 ENCOUNTER — Encounter: Payer: Self-pay | Admitting: Physical Therapy

## 2018-01-10 ENCOUNTER — Ambulatory Visit: Payer: PPO | Attending: Orthopedic Surgery | Admitting: Physical Therapy

## 2018-01-10 DIAGNOSIS — M546 Pain in thoracic spine: Secondary | ICD-10-CM | POA: Insufficient documentation

## 2018-01-10 DIAGNOSIS — M542 Cervicalgia: Secondary | ICD-10-CM | POA: Diagnosis not present

## 2018-01-10 NOTE — Therapy (Addendum)
[facility] [place], [place], [address] Phone: [phone]   Fax:  [phone]  Physical Therapy Treatment/Discharge  Progress Note Reporting Period 10/29/17 to 01/10/18  See note below for Objective Data and Assessment of Progress/Goals.       Patient Details  Name: Cheryl Reeves MRN: [id number] Date of Birth: Jul 31, 1970 Referring Provider: Melina Schools, MD   Encounter Date: 01/10/2018  PT End of Session - 01/10/18 1034    Visit Number  10    Number of Visits  12    Date for PT Re-Evaluation  01/14/18    PT Start Time  1030    PT Stop Time  1114    PT Time Calculation (min)  44 min    Activity Tolerance  Patient tolerated treatment well    Behavior During Therapy  [facility] for tasks assessed/performed       Past Medical History:  Diagnosis Date  . Allergy   . DJD (degenerative joint disease)   . GERD (gastroesophageal reflux disease)   . Stroke [facility]) 1999   S/p childbirth-weakness rt leg  . Thoracic injuries   . Wears glasses     Past Surgical History:  Procedure Laterality Date  . ARTHROSCOPY KNEE W/ DRILLING  2008   rt  . REPLACEMENT TOTAL KNEE Right 2009  . SEPTOPLASTY Bilateral 07/21/2013   Procedure: SEPTOPLASTY;  Surgeon: Melissa Montane, MD;  Location: [place];  Service: ENT;  Laterality: Bilateral;  . SINUS ENDO W/FUSION Bilateral 07/21/2013   Procedure: ENDOSCOPIC SINUS SURGERY WITH FUSION NAVIGATION;  Surgeon: Melissa Montane, MD;  Location: [place];  Service: ENT;  Laterality: Bilateral;  . TUBAL LIGATION  2000    There were no vitals filed for this visit.  Subjective Assessment - 01/10/18 1032    Subjective  Patient tolerated last treatment well, doing ok today with less discomfort overall    Limitations  Lifting;House hold activities    Currently in Pain?  Yes    Pain Score  1     Pain Location  Back    Pain Orientation  Mid;Upper    Pain Descriptors /  Indicators  Aching;Discomfort    Pain Type  Acute pain    Pain Onset  More than a month ago    Pain Frequency  Intermittent    Aggravating Factors   increased activity    Pain Relieving Factors  meds                       [facility] Adult PT Treatment/Exercise - 01/10/18 0001      Electrical Stimulation   Electrical Stimulation Location  Bilateral UT's/Rhomoids.    Electrical Stimulation Action  80-150hz  x11mn    Electrical Stimulation Goals  Pain      Ultrasound   Ultrasound Location  bil UT/mid thoracic    Ultrasound Parameters  1.5w/cm2/100%/143m x1227m   Ultrasound Goals  Pain      Manual Therapy   Manual Therapy  Soft tissue mobilization;Myofascial release    Soft tissue mobilization  to Bil UT and thoracic paraspinals to decrease pain and tone                  PT Long Term Goals - 01/10/18 1035      PT LONG TERM GOAL #1   Title  Patient will be independent with HEP    Baseline  met 01/10/18    Time  6  Period  Weeks    Status  Achieved      PT LONG TERM GOAL #2   Title  Patient will perform ADLs with less than 3/10 pain in upper thoracic and cervical spine.    Time  6    Period  Weeks    Status  Partially Met light ADL's 2-3/10 all ADL's 5+/10 01/10/18      PT LONG TERM GOAL #3   Title  Patient will report ability to sleep greater than 5 hours per night without waking up secondary to pain.    Time  6    Period  Weeks    Status  Achieved      PT LONG TERM GOAL #4   Title  Patient will improve cervical rotation AROM to 60+ degrees to adequately scan environment.    Time  6    Period  Weeks    Status  Achieved            Plan - 01/10/18 1103    Clinical Impression Statement  Patient tolerated treatment well today. Patient has reported less stress and less discomfort. Patient is able to perfom her HEP daily as given and light ADL's. Patient has some increased pain with heavy activity/ADL's. Patient met LTG #1 today.     Rehab  Potential  Good    PT Frequency  2x / week    PT Duration  6 weeks    PT Treatment/Interventions  Cryotherapy;Electrical Stimulation;Ultrasound;Moist Heat;Therapeutic activities;Therapeutic exercise;Patient/family education;Neuromuscular re-education;Manual techniques;Passive range of motion;Dry needling;Taping;Vasopneumatic Device    PT Next Visit Plan  cont with POC and DC after next visit    Consulted and Agree with Plan of Care  Patient       Patient will benefit from skilled therapeutic intervention in order to improve the following deficits and impairments:  Pain, Decreased activity tolerance, Decreased range of motion, Impaired UE functional use  Visit Diagnosis: Pain in thoracic spine  Cervicalgia     Problem List Patient Active Problem List   Diagnosis Date Noted  . Carpal tunnel syndrome of left wrist 10/16/2016  . Migraines 06/12/2015  . Allergic rhinitis 10/09/2013  . GERD (gastroesophageal reflux disease) 10/09/2013  . H/O: CVA (cerebrovascular accident) 10/09/2013  . Foot drop, right 10/09/2013  . Rosacea, acne 10/09/2013  . Seizures ([place]) 10/09/2013    PHYSICAL THERAPY DISCHARGE SUMMARY  Visits from Start of Care: 10  Current functional level related to goals / functional outcomes: See above   Remaining deficits: See goals   Education / Equipment: HEP Plan: Patient agrees to discharge.  Patient goals were partially met. Patient is being discharged due to not returning since the last visit.  ?????    Gabriela Eves, PT, DPT   Ladean Raya, PTA 01/10/18 11:14 AM  [facility] [address] [place], [place], [address] Phone: [phone]   Fax:  [phone]  Name: ROCQUEL ASKREN MRN: [id number] Date of Birth: 01-20-1971

## 2018-01-14 ENCOUNTER — Encounter: Payer: PPO | Admitting: *Deleted

## 2018-01-31 DIAGNOSIS — M4854XD Collapsed vertebra, not elsewhere classified, thoracic region, subsequent encounter for fracture with routine healing: Secondary | ICD-10-CM | POA: Diagnosis not present

## 2018-01-31 DIAGNOSIS — Z889 Allergy status to unspecified drugs, medicaments and biological substances status: Secondary | ICD-10-CM | POA: Insufficient documentation

## 2018-01-31 DIAGNOSIS — M546 Pain in thoracic spine: Secondary | ICD-10-CM | POA: Diagnosis not present

## 2018-02-21 ENCOUNTER — Other Ambulatory Visit: Payer: Self-pay | Admitting: Nurse Practitioner

## 2018-02-21 DIAGNOSIS — F411 Generalized anxiety disorder: Secondary | ICD-10-CM

## 2018-02-22 ENCOUNTER — Other Ambulatory Visit: Payer: Self-pay | Admitting: Nurse Practitioner

## 2018-02-22 DIAGNOSIS — I10 Essential (primary) hypertension: Secondary | ICD-10-CM

## 2018-02-22 NOTE — Telephone Encounter (Signed)
Last seen 09/13/17  MMM

## 2018-02-24 ENCOUNTER — Ambulatory Visit (INDEPENDENT_AMBULATORY_CARE_PROVIDER_SITE_OTHER): Payer: PPO | Admitting: *Deleted

## 2018-02-24 VITALS — BP 125/87 | HR 65 | Temp 97.9°F | Ht 64.0 in | Wt 158.6 lb

## 2018-02-24 DIAGNOSIS — I10 Essential (primary) hypertension: Secondary | ICD-10-CM

## 2018-02-24 DIAGNOSIS — Z0000 Encounter for general adult medical examination without abnormal findings: Secondary | ICD-10-CM | POA: Diagnosis not present

## 2018-02-24 LAB — CMP14+EGFR
A/G RATIO: 2 (ref 1.2–2.2)
ALT: 8 IU/L (ref 0–32)
AST: 13 IU/L (ref 0–40)
Albumin: 4.3 g/dL (ref 3.5–5.5)
Alkaline Phosphatase: 93 IU/L (ref 39–117)
BILIRUBIN TOTAL: 0.4 mg/dL (ref 0.0–1.2)
BUN/Creatinine Ratio: 13 (ref 9–23)
BUN: 8 mg/dL (ref 6–24)
CALCIUM: 8.8 mg/dL (ref 8.7–10.2)
CHLORIDE: 95 mmol/L — AB (ref 96–106)
CO2: 25 mmol/L (ref 20–29)
Creatinine, Ser: 0.62 mg/dL (ref 0.57–1.00)
GFR calc non Af Amer: 108 mL/min/{1.73_m2} (ref >59)
GFR, EST AFRICAN AMERICAN: 124 mL/min/{1.73_m2} (ref >59)
GLUCOSE: 81 mg/dL (ref 65–99)
Globulin, Total: 2.2 g/dL (ref 1.5–4.5)
POTASSIUM: 4.4 mmol/L (ref 3.5–5.2)
Sodium: 133 mmol/L — ABNORMAL LOW (ref 134–144)
TOTAL PROTEIN: 6.5 g/dL (ref 6.0–8.5)

## 2018-02-24 LAB — LIPID PANEL
Chol/HDL Ratio: 2.6 ratio (ref 0.0–4.4)
Cholesterol, Total: 180 mg/dL (ref 100–199)
HDL: 69 mg/dL (ref >39)
LDL Calculated: 91 mg/dL (ref 0–99)
Triglycerides: 100 mg/dL (ref 0–149)
VLDL CHOLESTEROL CAL: 20 mg/dL (ref 5–40)

## 2018-02-24 NOTE — Patient Instructions (Addendum)
  [name] , Thank you for taking time to come for your Medicare Wellness Visit. I appreciate your ongoing commitment to your health goals. Please review the following plan we discussed and let me know if I can assist you in the future.   These are the goals we discussed: Goals    . Exercise 3x per week (30 min per time)       This is a list of the screening recommended for you and due dates:  Health Maintenance  Topic Date Due  . HIV Screening  10/04/1985  . Mammogram  10/16/2017  . Pap Smear  12/25/2017  . Flu Shot  01/06/2018  . Tetanus Vaccine  09/02/2027

## 2018-02-24 NOTE — Progress Notes (Addendum)
Subjective:   Cheryl Reeves is a 47 y.o. female who presents for a Medicare Annual Wellness Visit. Cheryl Reeves lives at home with her husband and father. She has 1 son and 1 step so. Her father is currently undergoing chemotherapy for lung cancer and patient is transporting him to and from appointments. She used to work in a [employer] before her stroke. She has one dog in the home. She has stairs going onto the porch. Fall hazards were discussed with patient.    Review of Systems    Patient reports that her overall health is unchanged compared to last year.  Cardiac Risk Factors include: hypertension;smoking/ tobacco exposure;Other (see comment), Risk factor comments: History of stroke     All other systems negative       Current Medications (verified) Outpatient Encounter Medications as of 02/24/2018  Medication Sig  . ALPRAZolam (XANAX) 0.5 MG tablet TAKE 1 TABLET BY MOUTH TWICE A DAY AS NEEDED FOR ANXIETY  . aspirin 81 MG tablet Take 81 mg by mouth daily.  . baclofen (LIORESAL) 10 MG tablet TAKE 1 TABLET BY MOUTH TWICE A DAY  . buPROPion (WELLBUTRIN XL) 300 MG 24 hr tablet TAKE 1 TABLET BY MOUTH EVERY DAY  . carbamazepine (CARBATROL) 200 MG 12 hr capsule TAKE 1 CAPSULE (200 MG TOTAL) BY MOUTH 2 (TWO) TIMES DAILY.  . fluticasone (FLONASE) 50 MCG/ACT nasal spray USE 2 SPRAYS IN BOTH NOSTRILS DAILY (Patient taking differently: as needed. )  . ibuprofen (ADVIL,MOTRIN) 800 MG tablet TAKE 1 TABLET BY MOUTH EVERY 8 HOURS AS NEEDED  . lisinopril (PRINIVIL,ZESTRIL) 10 MG tablet TAKE 1 TABLET BY MOUTH EVERY DAY (Needs to be seen)  . psyllium (HYDROCIL/METAMUCIL) 95 % PACK Take 1 packet by mouth daily. 1 tsp daily.  . [DISCONTINUED] buPROPion (WELLBUTRIN XL) 300 MG 24 hr tablet TAKE 1 TABLET BY MOUTH EVERY DAY  . [DISCONTINUED] nortriptyline (PAMELOR) 25 MG capsule Take 2 capsules (50 mg total) by mouth at bedtime. (Patient not taking: Reported on 09/13/2017)  . [DISCONTINUED] ondansetron (ZOFRAN  ODT) 4 MG disintegrating tablet Take 1 tablet (4 mg total) by mouth every 8 (eight) hours as needed for nausea or vomiting.  . [DISCONTINUED] oxyCODONE-acetaminophen (PERCOCET/ROXICET) 5-325 MG tablet Take 1 tablet by mouth every 4 (four) hours as needed.   No facility-administered encounter medications on file as of 02/24/2018.     Allergies (verified) Codeine   History: Past Medical History:  Diagnosis Date  . Allergy   . DJD (degenerative joint disease)   . GERD (gastroesophageal reflux disease)   . Stroke [facility]) 1999   S/p childbirth-weakness rt leg  . Thoracic injuries   . Wears glasses    Past Surgical History:  Procedure Laterality Date  . ARTHROSCOPY KNEE W/ DRILLING  2008   rt  . REPLACEMENT TOTAL KNEE Right 2009  . SEPTOPLASTY Bilateral 07/21/2013   Procedure: SEPTOPLASTY;  Surgeon: Melissa Montane, MD;  Location: [place];  Service: ENT;  Laterality: Bilateral;  . SINUS ENDO W/FUSION Bilateral 07/21/2013   Procedure: ENDOSCOPIC SINUS SURGERY WITH FUSION NAVIGATION;  Surgeon: Melissa Montane, MD;  Location: [place];  Service: ENT;  Laterality: Bilateral;  . TUBAL LIGATION  2000   Family History  Problem Relation Age of Onset  . Cancer Mother        lung  . Arthritis Mother   . Heart disease Father   . Cancer Father        lung   .  Hypertension Father   . Heart disease Brother   . Hypertension Brother   . Diabetes Unknown   . Arthritis/Rheumatoid Unknown    Social History   Socioeconomic History  . Marital status: Married    Spouse name: Not on file  . Number of children: 2  . Years of education: 10  . Highest education level: 9th grade  Occupational History  . Not on file  Social Needs  . Financial resource strain: Not very hard  . Food insecurity:    Worry: Never true    Inability: Never true  . Transportation needs:    Medical: No    Non-medical: No  Tobacco Use  . Smoking status: Current Every Day Smoker    Packs/day:  1.00    Types: Cigarettes    Last attempt to quit: 12/06/2011    Years since quitting: 6.2  . Smokeless tobacco: Never Used  Substance and Sexual Activity  . Alcohol use: Yes    Alcohol/week: 0.0 standard drinks    Comment: occ  . Drug use: Yes    Types: Marijuana    Comment: occasionally  . Sexual activity: Yes  Lifestyle  . Physical activity:    Days per week: 0 days    Minutes per session: 0 min  . Stress: To some extent  Relationships  . Social connections:    Talks on phone: More than three times a week    Gets together: Twice a week    Attends religious service: Never    Active member of club or organization: No    Attends meetings of clubs or organizations: Never    Relationship status: Married  Other Topics Concern  . Not on file  Social History Narrative   Married, 1 son and 1 stepson    Right handed   10 th grade   1/2-1 cup daily    Tobacco Use Yes.  Smoking cessation offered? Yes    Activities of Daily Living In your present state of health, do you have any difficulty performing the following activities: 02/24/2018  Hearing? Y  Comment Patient states that at times she seems to have some trouble   Vision? N  Comment Patient does wear glasses   Difficulty concentrating or making decisions? N  Walking or climbing stairs? Y  Comment Patient states she does due to history of stroke   Dressing or bathing? N  Doing errands, shopping? N  Preparing Food and eating ? N  Using the Toilet? N  In the past six months, have you accidently leaked urine? N  Do you have problems with loss of bowel control? N  Managing your Medications? N  Managing your Finances? N  Housekeeping or managing your Housekeeping? Y  Comment At times due to history of stroke  Some recent data might be hidden     Diet Patient states diet is semi-healthy   Current Exercise Habits: The patient does not participate in regular exercise at present, Exercise limited by: neurologic  condition(s);orthopedic condition(s)   Depression Screen PHQ 2/9 Scores 02/24/2018 09/10/2017 09/07/2017 08/13/2017 04/30/2017 11/17/2016 09/10/2016  PHQ - 2 Score 0 - 6 0 0 0 0  PHQ- 9 Score - - 21 - - - -  Exception Documentation - Other- indicate reason in comment box - - - - -  Not completed - unable to assess-call ended early - - - - -     Fall Risk Fall Risk  02/24/2018 09/07/2017 08/13/2017 04/30/2017 11/17/2016  Falls in  the past year? Yes Yes No No No  Number falls in past yr: 2 or more 2 or more - - -  Injury with Fall? Yes No - - -  Risk Factor Category  High Fall Risk - - - -  Risk for fall due to : - - - - -  Follow up Falls prevention discussed - - - -    Safety Is the patient's home free of loose throw rugs in walkways, pet beds, electrical cords, etc?   yes      Grab bars in the bathroom? yes      Handrails on the stairs?   yes      Adequate lighting?   yes  Patient Care Team: [name], FNP as PCP - General (Family Medicine) Marcial Pacas, MD as Consulting Physician (Neurology)  Hospitalizations, surgeries, and ER visits in previous 12 months No hospitalizations, ER visits, or surgeries this past year.  Objective:    Today's Vitals   02/24/18 0844 02/24/18 0848  BP: (!) 157/97 125/87  Pulse: 65 65  Temp: 97.9 F (36.6 C)   TempSrc: Oral   Weight: 158 lb 9.6 oz (71.9 kg)   Height: 5' 4"  (1.626 m)    Body mass index is 27.22 kg/m.  Advanced Directives 02/24/2018 10/29/2017 09/10/2017 09/05/2017 07/19/2013  Does Patient Have a Medical Advance Directive? No No No No Patient does not have advance directive;Patient would not like information  Would patient like information on creating a medical advance directive? Yes (MAU/Ambulatory/Procedural Areas - Information given) - No - Patient declined No - Patient declined -    Hearing/Vision  No hearing or vision deficits noted during visit.  Cognitive Function: MMSE - Mini Mental State Exam 02/24/2018  Orientation to  time 5  Orientation to Place 5  Registration 3  Attention/ Calculation 5  Recall 3  Language- name 2 objects 2  Language- repeat 1  Language- follow 3 step command 3  Language- read & follow direction 1  Write a sentence 1  Copy design 1  Total score 30       Normal Cognitive Function Screening: Yes    Immunizations and Health Maintenance Immunization History  Administered Date(s) Administered  . Influenza Split 03/15/2013  . Tdap 09/01/2017   Health Maintenance Due  Topic Date Due  . HIV Screening  10/04/1985  . MAMMOGRAM  10/16/2017  . PAP SMEAR  12/25/2017  . INFLUENZA VACCINE  01/06/2018   Health Maintenance  Topic Date Due  . HIV Screening  10/04/1985  . MAMMOGRAM  10/16/2017  . PAP SMEAR  12/25/2017  . INFLUENZA VACCINE  01/06/2018  . TETANUS/TDAP  09/02/2027        Assessment:   This is a routine wellness examination for Cheryl Reeves.    Plan:    Goals    . Exercise 3x per week (30 min per time)        Health Maintenance Recommendations: Influenza vaccine Screening mammography Screening Pap smear and pelvic exam  Smoking cessation counseling Advanced directives: has NO advanced directive  - add't info requested. Referral to SW: no   Additional Screening Recommendations: Lung: Low Dose CT Chest recommended if Age 67-80 years, 30 pack-year currently smoking OR have quit w/in 15years. Patient does not qualify. Hepatitis C Screening recommended: no  Today's Orders Orders Placed This Encounter  Procedures  . CMP14+EGFR  . Lipid panel    Keep f/u with [name], FNP and any other specialty appointments you may have  Continue current medications Move carefully to avoid falls. Use assistive devices like a cane or walker if needed. Aim for at least 150 minutes of moderate activity a week. This can be done with chair exercises if necessary. Read or work on puzzles daily Stay connected with friends and family  I have personally reviewed  and noted the following in the patient's chart:   . Medical and social history . Use of alcohol, tobacco or illicit drugs  . Current medications and supplements . Functional ability and status . Nutritional status . Physical activity . Advanced directives . List of other physicians . Hospitalizations, surgeries, and ER visits in previous 12 months . Vitals . Screenings to include cognitive, depression, and falls . Referrals and appointments  In addition, I have reviewed and discussed with patient certain preventive protocols, quality metrics, and best practice recommendations. A written personalized care plan for preventive services as well as general preventive health recommendations were provided to patient.     Lynnea Ferrier, LPN   0/27/2536   I have reviewed and agree with the above AWV documentation.   Mary-Margaret Hassell Done, FNP

## 2018-02-28 ENCOUNTER — Ambulatory Visit (INDEPENDENT_AMBULATORY_CARE_PROVIDER_SITE_OTHER): Payer: PPO | Admitting: Nurse Practitioner

## 2018-02-28 ENCOUNTER — Encounter: Payer: Self-pay | Admitting: Nurse Practitioner

## 2018-02-28 ENCOUNTER — Other Ambulatory Visit: Payer: Self-pay | Admitting: Nurse Practitioner

## 2018-02-28 VITALS — BP 127/78 | HR 87 | Temp 97.3°F | Ht 64.0 in | Wt 159.0 lb

## 2018-02-28 DIAGNOSIS — F411 Generalized anxiety disorder: Secondary | ICD-10-CM

## 2018-02-28 DIAGNOSIS — B3731 Acute candidiasis of vulva and vagina: Secondary | ICD-10-CM

## 2018-02-28 DIAGNOSIS — R569 Unspecified convulsions: Secondary | ICD-10-CM

## 2018-02-28 DIAGNOSIS — Z0000 Encounter for general adult medical examination without abnormal findings: Secondary | ICD-10-CM

## 2018-02-28 DIAGNOSIS — B373 Candidiasis of vulva and vagina: Secondary | ICD-10-CM

## 2018-02-28 DIAGNOSIS — Z01419 Encounter for gynecological examination (general) (routine) without abnormal findings: Secondary | ICD-10-CM | POA: Diagnosis not present

## 2018-02-28 DIAGNOSIS — M21371 Foot drop, right foot: Secondary | ICD-10-CM

## 2018-02-28 DIAGNOSIS — K219 Gastro-esophageal reflux disease without esophagitis: Secondary | ICD-10-CM

## 2018-02-28 DIAGNOSIS — I6930 Unspecified sequelae of cerebral infarction: Secondary | ICD-10-CM

## 2018-02-28 DIAGNOSIS — N3000 Acute cystitis without hematuria: Secondary | ICD-10-CM

## 2018-02-28 DIAGNOSIS — I10 Essential (primary) hypertension: Secondary | ICD-10-CM

## 2018-02-28 DIAGNOSIS — F4310 Post-traumatic stress disorder, unspecified: Secondary | ICD-10-CM

## 2018-02-28 DIAGNOSIS — G43909 Migraine, unspecified, not intractable, without status migrainosus: Secondary | ICD-10-CM

## 2018-02-28 LAB — URINALYSIS, COMPLETE
Bilirubin, UA: NEGATIVE
Glucose, UA: NEGATIVE
Ketones, UA: NEGATIVE
Nitrite, UA: NEGATIVE
PH UA: 7.5 (ref 5.0–7.5)
PROTEIN UA: NEGATIVE
Specific Gravity, UA: 1.015 (ref 1.005–1.030)
Urobilinogen, Ur: 0.2 mg/dL (ref 0.2–1.0)

## 2018-02-28 LAB — MICROSCOPIC EXAMINATION
Renal Epithel, UA: NONE SEEN /hpf
WBC, UA: >30 /hpf — AB (ref 0–5)

## 2018-02-28 MED ORDER — CARBAMAZEPINE ER 200 MG PO CP12
200.0000 mg | ORAL_CAPSULE | Freq: Two times a day (BID) | ORAL | 1 refills | Status: DC
Start: 2018-02-28 — End: 2018-09-02

## 2018-02-28 MED ORDER — LISINOPRIL 10 MG PO TABS: ORAL_TABLET | ORAL | 1 refills | Status: DC

## 2018-02-28 MED ORDER — NITROFURANTOIN MONOHYD MACRO 100 MG PO CAPS: 100.0000 mg | ORAL_CAPSULE | Freq: Two times a day (BID) | ORAL | 0 refills | Status: DC

## 2018-02-28 MED ORDER — ALPRAZOLAM 0.5 MG PO TABS: ORAL_TABLET | ORAL | 2 refills | Status: DC

## 2018-02-28 MED ORDER — BACLOFEN 10 MG PO TABS
10.0000 mg | ORAL_TABLET | Freq: Two times a day (BID) | ORAL | 1 refills | Status: DC
Start: 2018-02-28 — End: 2018-09-05

## 2018-02-28 MED ORDER — BUPROPION HCL ER (XL) 300 MG PO TB24: 300.0000 mg | ORAL_TABLET | Freq: Every day | ORAL | 1 refills | Status: DC

## 2018-02-28 MED ORDER — FLUCONAZOLE 150 MG PO TABS: 150.0000 mg | ORAL_TABLET | Freq: Once | ORAL | 0 refills | Status: AC

## 2018-02-28 NOTE — Progress Notes (Signed)
 Subjective:    Patient ID: Cheryl Reeves, female    DOB: 12/23/1970, 47 y.o.   MRN: [id number]   Chief Complaint: Annual Exam   HPI:  1. Annual physical exam  With PAP  2. Migraine without status migrainosus, not intractable, unspecified migraine type  She does no think she is having migraines, she thinks they are sinus headaches because usually starts hurting if she bends over. She has stopped all of migraine meds because ethey were making no difference.  3. Gastroesophageal reflux disease, esophagitis presence not specified  no on any rx meds. She has not had any recent flare ups.  4. Late effect of cerebrovascular accident (CVA)  Has permanent right sided weakness- occurred from post partum hemorrhage. Walks with cane  5. Seizures (HCC)  Has had no recent seizures. Last one was 2001  6. Foot drop, right  Secondary to her CVA  7.      essential hypertension          No c/o chest pain, sob or headache. Does not chek blood pressure at          Home.  Outpatient Encounter Medications as of 02/28/2018  Medication Sig  . ALPRAZolam (XANAX) 0.5 MG tablet TAKE 1 TABLET BY MOUTH TWICE A DAY AS NEEDED FOR ANXIETY  . aspirin 81 MG tablet Take 81 mg by mouth daily.  . baclofen (LIORESAL) 10 MG tablet TAKE 1 TABLET BY MOUTH TWICE A DAY  . buPROPion (WELLBUTRIN XL) 300 MG 24 hr tablet TAKE 1 TABLET BY MOUTH EVERY DAY  . carbamazepine (CARBATROL) 200 MG 12 hr capsule TAKE 1 CAPSULE (200 MG TOTAL) BY MOUTH 2 (TWO) TIMES DAILY.  . fluticasone (FLONASE) 50 MCG/ACT nasal spray USE 2 SPRAYS IN BOTH NOSTRILS DAILY (Patient taking differently: as needed. )  . ibuprofen (ADVIL,MOTRIN) 800 MG tablet TAKE 1 TABLET BY MOUTH EVERY 8 HOURS AS NEEDED  . lisinopril (PRINIVIL,ZESTRIL) 10 MG tablet TAKE 1 TABLET BY MOUTH EVERY DAY (Needs to be seen)  . psyllium (HYDROCIL/METAMUCIL) 95 % PACK Take 1 packet by mouth daily. 1 tsp daily.       New complaints: None today  Social history: Dad lives  with her and her new husband because he has cancer.   Review of Systems  Constitutional: Negative for activity change and appetite change.  HENT: Negative.   Eyes: Negative for pain.  Respiratory: Negative for shortness of breath.   Cardiovascular: Negative for chest pain, palpitations and leg swelling.  Gastrointestinal: Negative for abdominal pain.  Endocrine: Negative for polydipsia.  Genitourinary: Negative.   Skin: Negative for rash.  Neurological: Positive for headaches. Negative for dizziness and weakness.  Hematological: Does not bruise/bleed easily.  Psychiatric/Behavioral: Negative.   All other systems reviewed and are negative.      Objective:   Physical Exam  Constitutional: She is oriented to person, place, and time. She appears well-developed and well-nourished.  HENT:  Nose: Nose normal.  Mouth/Throat: Oropharynx is clear and moist.  Eyes: EOM are normal.  Neck: Trachea normal, normal range of motion and full passive range of motion without pain. Neck supple. No JVD present. Carotid bruit is not present. No thyromegaly present.  Cardiovascular: Normal rate, regular rhythm, normal heart sounds and intact distal pulses. Exam reveals no gallop and no friction rub.  No murmur heard. Pulmonary/Chest: Effort normal and breath sounds normal.  Abdominal: Soft. Bowel sounds are normal. She exhibits no distension and no mass. There is no tenderness.    Genitourinary: Uterus normal. Vaginal discharge (white cheesy vaginal discharge.) found.  Genitourinary Comments: Cervix parous and pink No adnexal masses or tenderness.  Musculoskeletal: Normal range of motion.  Lymphadenopathy:    She has no cervical adenopathy.  Neurological: She is alert and oriented to person, place, and time. She has normal reflexes.  Skin: Skin is warm and dry.  Psychiatric: She has a normal mood and affect. Her behavior is normal. Judgment and thought content normal.   BP 127/78   Pulse 87   Temp  (!) 97.3 F (36.3 C) (Oral)   Ht 5' 4" (1.626 m)   Wt 159 lb (72.1 kg)   BMI 27.29 kg/m   UA- 3+ leuks      Assessment & Plan:  Henri T Shelvin-Manuel comes in today with chief complaint of Annual Exam   Diagnosis and orders addressed:  1. Annual physical exam - Urinalysis, Complete - CMP14+EGFR - Lipid panel - Thyroid Panel With TSH - CBC with Differential/Platelet  2. Migraine without status migrainosus, not intractable, unspecified migraine type Keep diary of episodes- what doing, when, how intense and how long last. Encouraged to follow up with neurology to discuss new injectable migrane meds  3. Gastroesophageal reflux disease, esophagitis presence not specified Avoid spicy foods Do not eat 2 hours prior to bedtime  4. Late effect of cerebrovascular accident (CVA) Fall precautions - baclofen (LIORESAL) 10 MG tablet; Take 1 tablet (10 mg total) by mouth 2 (two) times daily.  Dispense: 180 tablet; Refill: 1  5. Seizures (HCC) - carbamazepine (CARBATROL) 200 MG 12 hr capsule; Take 1 capsule (200 mg total) by mouth 2 (two) times daily.  Dispense: 180 capsule; Refill: 1  6. Foot drop, right Wear brace in shoe to help with gait  7. Vaginal candidiasis No douchinno bubble baths - fluconazole (DIFLUCAN) 150 MG tablet; Take 1 tablet (150 mg total) by mouth once for 1 dose.  Dispense: 1 tablet; Refill: 0  8. Acute cystitis without hematuria Take medication as prescribe Cotton underwear Take shower not bath Cranberry juice, yogurt Force fluids AZO over the counter X2 days Culture pending RTO prn  - nitrofurantoin, macrocrystal-monohydrate, (MACROBID) 100 MG capsule; Take 1 capsule (100 mg total) by mouth 2 (two) times daily. 1 po BId  Dispense: 10 capsule; Refill: 0 - Urine Culture  9. Gynecologic exam normal - IGP, Aptima HPV, rfx 16/18,45  10. Essential hypertension Low sodium diet - lisinopril (PRINIVIL,ZESTRIL) 10 MG tablet; TAKE 1 TABLET BY MOUTH EVERY DAY  (Needs to be seen)  Dispense: 90 tablet; Refill: 1  11. GAD (generalized anxiety disorder) Stress management - buPROPion (WELLBUTRIN XL) 300 MG 24 hr tablet; Take 1 tablet (300 mg total) by mouth daily.  Dispense: 90 tablet; Refill: 1  12. Post traumatic stress disorder - ALPRAZolam (XANAX) 0.5 MG tablet; TAKE 1 TABLET BY MOUTH TWICE A DAY AS NEEDED FOR ANXIETY  Dispense: 60 tablet; Refill: 2   Labs pending Health Maintenance reviewed Diet and exercise encouraged  Follow up plan: 3 months for gad   Mary-Margaret , FNP  

## 2018-02-28 NOTE — Patient Instructions (Signed)
Stress and Stress Management Stress is a normal reaction to life events. It is what you feel when life demands more than you are used to or more than you can handle. Some stress can be useful. For example, the stress reaction can help you catch the last bus of the day, study for a test, or meet a deadline at work. But stress that occurs too often or for too long can cause problems. It can affect your emotional health and interfere with relationships and normal daily activities. Too much stress can weaken your immune system and increase your risk for physical illness. If you already have a medical problem, stress can make it worse. What are the causes? All sorts of life events may cause stress. An event that causes stress for one person may not be stressful for another person. Major life events commonly cause stress. These may be positive or negative. Examples include losing your job, moving into a new home, getting married, having a baby, or losing a loved one. Less obvious life events may also cause stress, especially if they occur day after day or in combination. Examples include working long hours, driving in traffic, caring for children, being in debt, or being in a difficult relationship. What are the signs or symptoms? Stress may cause emotional symptoms including, the following:  Anxiety. This is feeling worried, afraid, on edge, overwhelmed, or out of control.  Anger. This is feeling irritated or impatient.  Depression. This is feeling sad, down, helpless, or guilty.  Difficulty focusing, remembering, or making decisions.  Stress may cause physical symptoms, including the following:  Aches and pains. These may affect your head, neck, back, stomach, or other areas of your body.  Tight muscles or clenched jaw.  Low energy or trouble sleeping.  Stress may cause unhealthy behaviors, including the following:  Eating to feel better (overeating) or skipping meals.  Sleeping too little,  too much, or both.  Working too much or putting off tasks (procrastination).  Smoking, drinking alcohol, or using drugs to feel better.  How is this diagnosed? Stress is diagnosed through an assessment by your health care provider. Your health care provider will ask questions about your symptoms and any stressful life events.Your health care provider will also ask about your medical history and may order blood tests or other tests. Certain medical conditions and medicine can cause physical symptoms similar to stress. Mental illness can cause emotional symptoms and unhealthy behaviors similar to stress. Your health care provider may refer you to a mental health professional for further evaluation. How is this treated? Stress management is the recommended treatment for stress.The goals of stress management are reducing stressful life events and coping with stress in healthy ways. Techniques for reducing stressful life events include the following:  Stress identification. Self-monitor for stress and identify what causes stress for you. These skills may help you to avoid some stressful events.  Time management. Set your priorities, keep a calendar of events, and learn to say "no." These tools can help you avoid making too many commitments.  Techniques for coping with stress include the following:  Rethinking the problem. Try to think realistically about stressful events rather than ignoring them or overreacting. Try to find the positives in a stressful situation rather than focusing on the negatives.  Exercise. Physical exercise can release both physical and emotional tension. The key is to find a form of exercise you enjoy and do it regularly.  Relaxation techniques. These relax the body and  mind. Examples include yoga, meditation, tai chi, biofeedback, deep breathing, progressive muscle relaxation, listening to music, being out in nature, journaling, and other hobbies. Again, the key is to find  one or more that you enjoy and can do regularly.  Healthy lifestyle. Eat a balanced diet, get plenty of sleep, and do not smoke. Avoid using alcohol or drugs to relax.  Strong support network. Spend time with family, friends, or other people you enjoy being around.Express your feelings and talk things over with someone you trust.  Counseling or talktherapy with a mental health professional may be helpful if you are having difficulty managing stress on your own. Medicine is typically not recommended for the treatment of stress.Talk to your health care provider if you think you need medicine for symptoms of stress. Follow these instructions at home:  Keep all follow-up visits as directed by your health care provider.  Take all medicines as directed by your health care provider. Contact a health care provider if:  Your symptoms get worse or you start having new symptoms.  You feel overwhelmed by your problems and can no longer manage them on your own. Get help right away if:  You feel like hurting yourself or someone else. This information is not intended to replace advice given to you by your health care provider. Make sure you discuss any questions you have with your health care provider. Document Released: 11/18/2000 Document Revised: 10/31/2015 Document Reviewed: 01/17/2013 Elsevier Interactive Patient Education  2017 [employer]  

## 2018-02-28 NOTE — Addendum Note (Signed)
Addended by: Bennie PieriniMARTIN, MARY-MARGARET on: 02/28/2018 09:24 AM   Modules accepted: Orders

## 2018-03-02 LAB — URINE CULTURE

## 2018-03-03 LAB — IGP, APTIMA HPV, RFX 16/18,45
HPV APTIMA: POSITIVE — AB
HPV Genotype 16: NEGATIVE
HPV Genotype 18,45: NEGATIVE
PAP Smear Comment: 0

## 2018-06-17 DIAGNOSIS — J019 Acute sinusitis, unspecified: Secondary | ICD-10-CM | POA: Diagnosis not present

## 2018-06-23 ENCOUNTER — Encounter: Payer: Self-pay | Admitting: Nurse Practitioner

## 2018-06-23 ENCOUNTER — Ambulatory Visit (INDEPENDENT_AMBULATORY_CARE_PROVIDER_SITE_OTHER): Payer: PPO | Admitting: Nurse Practitioner

## 2018-06-23 VITALS — BP 161/92 | HR 80 | Temp 97.0°F | Ht 64.0 in | Wt 160.6 lb

## 2018-06-23 DIAGNOSIS — F4310 Post-traumatic stress disorder, unspecified: Secondary | ICD-10-CM | POA: Diagnosis not present

## 2018-06-23 DIAGNOSIS — B850 Pediculosis due to Pediculus humanus capitis: Secondary | ICD-10-CM | POA: Diagnosis not present

## 2018-06-23 MED ORDER — ALPRAZOLAM 0.5 MG PO TABS: ORAL_TABLET | ORAL | 2 refills | Status: DC

## 2018-06-23 MED ORDER — PERMETHRIN 1 % EX LIQD: CUTANEOUS | 0 refills | Status: DC

## 2018-06-23 NOTE — Patient Instructions (Signed)
Lice, Adult         Lice are tiny insects with claws on the ends of their legs. They are small parasites that live on the human body. A parasite is an insect that lives off another animal and cannot survive without it. Lice often make their home in a person's hair, such as hair on the head or in the pubic area. Pubic lice are sometimes referred to as crabs. Lice hatch from little round eggs, which are attached to the base of hairs. Lice eggs are also called nits.  Lice can spread from one person to another. Lice crawl. They do not fly or jump. Lice cause skin irritation and itching in the area of the infested hair. Although having lice can be annoying, it is not dangerous. Lice do not spread diseases. Treatment will usually clear up the symptoms within a few days.  What are the causes?  This condition may be caused by:   Having very close contact with an infested person.   Sharing infested items that touch your skin and hair. These include personal items, such as hats, combs, brushes, towels, clothing, pillowcases, or sheets.  Pubic lice are spread through sexual contact.  What increases the risk?  Although having lice is more common among young children, anyone can get lice. Lice tend to thrive in warm weather, so that type of weather increases the risk.  What are the signs or symptoms?  Symptoms of this condition include:   Itchiness in the affected area.   Skin irritation.   Feeling of something moving in the hair.   Rash or sores on the skin.   Tiny flakes or sacs near the scalp. These may be white, yellow, or tan.   Tiny bugs crawling on the hair or scalp.  How is this diagnosed?  This condition is diagnosed based on:   Your symptoms.   A physical exam.  ? Your health care provider will examine the affected area closely for live lice, tiny eggs (nits), and empty egg cases.  ? Eggs are typically yellow or tan in color. Empty egg cases are whitish. Lice are gray or brown.  How is this  treated?  Treatment for this condition includes:   Using a hair rinse that contains a mild insecticide to kill lice. Your health care provider will recommend a prescription or over-the-counter rinse.   Removing lice, eggs, and egg cases by using a comb or tweezers.   Washing and bagging your clothing and bedding.  Pregnant women should not use medicated shampoo or cream without first talking to their health care provider.  Follow these instructions at home:  Using medicated rinse  Apply medicated rinse as told by your health care provider. Follow the label instructions carefully. General instructions for applying rinses may include these steps:  1. Put on an old shirt or underwear, or use an old towel in case of staining from the rinse.  2. Wash and towel-dry your head or pubic area before applying the rinse if directed to do so.  3. When your hair is dry, apply the rinse. Leave the rinse in your hair for the amount of time specified in the instructions.  4. Rinse the area with water.  5. Comb your wet hair with a fine-tooth comb. Comb it close to the skin and down to the ends, removing any lice, eggs, or egg cases. A lice comb may be included with the medicated rinse.  6. Do not wash the infested   hair for 2 days while the medicine kills the lice.  7. After the treatment, repeat combing out your hair and removing lice, eggs, or egg cases from the hair every 2-3 days. Do this for about 2-3 weeks. After treatment, the remaining lice should be moving more slowly.  8. Repeat the treatment if necessary in 7-10 days.  General instructions   Remove any remaining lice, eggs, or egg cases using a fine-tooth comb.   Use hot water to wash all towels, hats, scarves, jackets, bedding, and clothing that you have recently used.   Put any non-washable items that may have been exposed into plastic bags. Keep the bags closed for 2 weeks.   Soak all combs and brushes in hot water for 10 minutes.   Vacuum furniture to remove  any loose hair. There is no need to use chemicals, which can be poisonous (toxic). Lice survive for only 1-2 days away from human skin. Eggs may survive for only 1 week.   For pubic lice, tell any sexual partners to seek treatment.   For head lice, ask your health care provider if other family members or close contacts should be examined or treated as well.   Keep all follow-up visits as told by your health care provider. This is important.  Contact a health care provider if:   You develop sores that look infected.   Your rash or sores do not go away in 1 week.   The lice or eggs return or do not go away in spite of treatment.  Summary   Lice are tiny parasitic insects that live on the human body. A parasite is an insect that lives off another animal and cannot survive without it.   Lice can spread from one person to another through close contact with an infested person or by sharing personal items, such as combs, brushes, or hats.   Lice can be treated with a medicated rinse. Follow your health care provider's instructions, or instructions on the label, if you are being treated with this medicine.   Ask your health care provider if your family members or close contacts should be treated for lice.  This information is not intended to replace advice given to you by your health care provider. Make sure you discuss any questions you have with your health care provider.  Document Released: 05/25/2005 Document Revised: 06/03/2017 Document Reviewed: 06/03/2016  Elsevier Interactive Patient Education  2019 [employer]

## 2018-06-23 NOTE — Progress Notes (Signed)
   Subjective:    Patient ID: Cheryl Reeves, female    DOB: [birth year], 48 y.o.   MRN: [id number]   Chief Complaint: Scalp itching   HPI Patient comes in today c/o scalp itching. Has been going on for several months and is getting worse. She has tried tgel shampoo, which did not help.   Review of Systems  Constitutional: Negative.   Respiratory: Negative.   Cardiovascular: Negative.   Gastrointestinal: Negative.   Neurological: Negative.   Psychiatric/Behavioral: Negative.   All other systems reviewed and are negative.      Objective:   Physical Exam Vitals signs and nursing note reviewed.  Constitutional:      Appearance: She is obese.  Cardiovascular:     Rate and Rhythm: Normal rate and regular rhythm.     Pulses: Normal pulses.     Heart sounds: Normal heart sounds.  Pulmonary:     Effort: Pulmonary effort is normal.     Breath sounds: Normal breath sounds.  Skin:    General: Skin is warm.     Comments: White nit on hair shaft in back  Neurological:     General: No focal deficit present.     Mental Status: She is alert and oriented to person, place, and time.  Psychiatric:        Mood and Affect: Mood normal.     BP (!) 161/92   Pulse 80   Temp (!) 97 F (36.1 C) (Oral)   Ht 5\' 4"  (1.626 m)   Wt 160 lb 9.6 oz (72.8 kg)   BMI 27.57 kg/m        Assessment & Plan:  Cheryl Reeves in today with chief complaint of Scalp itching   1. Head lice Meds ordered this encounter  Medications  . permethrin (NIX CREME RINSE) 1 % liquid    Sig: Apply to hair-leave on lather the rinse.    Dispense:  118 mL    Refill:  0    Order Specific Question:   Supervising Provider    Answer:   Johna Sheriff [[id number]]   Avoid scratching  [name], FNP

## 2018-06-27 ENCOUNTER — Other Ambulatory Visit: Payer: Self-pay

## 2018-06-27 NOTE — Patient Outreach (Signed)
[facility]) Care Management  06/27/2018  Cheryl Reeves 1971/04/01 [id number]  Nurse Call Line Referral Date: 06/27/18 Reason for Referral: question regarding head lice treatment Nurse call line recommendation:  Advisement for home care Attempt #1  Telephone call to patient regarding nurse call line referral. Unable to reach patient. HIPAA compliant voice message left with call back phone number  PLAN: RNCm will attempt 2nd telephone call to patient within 4 business days.  RNCM will send outreach letter to patient to attempt contact.   George Ina RN,BSN,CCM [facility] Telephonic  [phone]

## 2018-06-28 ENCOUNTER — Other Ambulatory Visit: Payer: Self-pay

## 2018-06-28 NOTE — Patient Outreach (Signed)
[facility]) Care Management  06/28/2018  Cheryl Reeves 1971-01-08 [id number]   Nurse Call Line Referral Date: 06/27/18 Reason for Referral: question regarding head lice treatment Nurse call line recommendation:  Advisement for home care Attempt #2  Telephone call to patient regarding nurse call line referral. Unable to reach patient. HIPAA compliant voice message left with call back phone number.   PLAN:  RNCM will attempt 3rd telephone outreach to patient within 4 business days   George Ina RN,BSN,CCM [facility] Telephonic  [phone]

## 2018-06-30 ENCOUNTER — Other Ambulatory Visit: Payer: Self-pay

## 2018-06-30 NOTE — Patient Outreach (Signed)
[facility]) Care Management  06/30/2018  EMMARIE OVERMAN 08-May-1971 [id number]   Nurse Call Line Referral Date:06/27/18 Reason for Referral: question regarding head lice treatment Nurse call line recommendation:Advisement for home care Attempt #3  Telephone call to patient regarding nurse call line referral. Unable to reach patient. HIPAA compliant voice message left with call back phone number.   PLAN:  If no response with proceed with closure  George Ina RN,BSN,CCM [facility] Telephonic  [phone]

## 2018-07-05 ENCOUNTER — Other Ambulatory Visit: Payer: Self-pay

## 2018-07-05 NOTE — Patient Outreach (Signed)
[facility]) Care Management  07/05/2018  ZELPHIA KOLLIAS 1970/06/22 [id number]   Case closure: / Nurse Call Line Referral Date:06/27/18 Reason for Referral: question regarding head lice treatment Nurse call line recommendation:Advisement for home care  No response after 3 telephone calls and outreach letter attempt.  PLAN: RNCM will close patient due to being unable to reach.  RNCM will send closure notification to patient's primary MD   George Ina RN,BSN,CCM [facility] Telephonic  [phone]

## 2018-09-02 ENCOUNTER — Other Ambulatory Visit: Payer: Self-pay | Admitting: Nurse Practitioner

## 2018-09-02 DIAGNOSIS — R569 Unspecified convulsions: Secondary | ICD-10-CM

## 2018-09-02 DIAGNOSIS — I10 Essential (primary) hypertension: Secondary | ICD-10-CM

## 2018-09-03 ENCOUNTER — Other Ambulatory Visit: Payer: Self-pay | Admitting: Nurse Practitioner

## 2018-09-03 DIAGNOSIS — I6930 Unspecified sequelae of cerebral infarction: Secondary | ICD-10-CM

## 2018-09-08 ENCOUNTER — Other Ambulatory Visit: Payer: Self-pay | Admitting: Nurse Practitioner

## 2018-09-08 DIAGNOSIS — R569 Unspecified convulsions: Secondary | ICD-10-CM

## 2018-09-12 ENCOUNTER — Other Ambulatory Visit: Payer: Self-pay | Admitting: Nurse Practitioner

## 2018-09-12 DIAGNOSIS — R569 Unspecified convulsions: Secondary | ICD-10-CM

## 2018-09-22 ENCOUNTER — Telehealth: Payer: Self-pay | Admitting: *Deleted

## 2018-09-22 ENCOUNTER — Encounter: Payer: Self-pay | Admitting: Nurse Practitioner

## 2018-09-22 ENCOUNTER — Other Ambulatory Visit: Payer: Self-pay

## 2018-09-22 ENCOUNTER — Ambulatory Visit (INDEPENDENT_AMBULATORY_CARE_PROVIDER_SITE_OTHER): Payer: PPO | Admitting: Nurse Practitioner

## 2018-09-22 DIAGNOSIS — H60502 Unspecified acute noninfective otitis externa, left ear: Secondary | ICD-10-CM | POA: Diagnosis not present

## 2018-09-22 MED ORDER — NEOMYCIN-POLYMYXIN-HC 1 % OT SOLN: 3.0000 [drp] | Freq: Four times a day (QID) | OTIC | 0 refills | Status: DC

## 2018-09-22 MED ORDER — OFLOXACIN 0.3 % OT SOLN: 5.0000 [drp] | Freq: Every day | OTIC | 0 refills | Status: DC

## 2018-09-22 NOTE — Addendum Note (Signed)
Addended by: Bennie Pierini on: 09/22/2018 10:37 AM   Modules accepted: Orders

## 2018-09-22 NOTE — Progress Notes (Addendum)
Patient ID: Cheryl Reeves, female   DOB: 1971/05/28, 48 y.o.   MRN: [id number]    Virtual Visit via telephone Note  I connected with Cheryl Reeves on 09/22/18 at 9:1 AM by telephone and verified that I am speaking with the correct person using two identifiers. Cheryl Reeves is currently located at home and her husband is currently with her during visit. The provider, [name], FNP is located in their office at time of visit.  I discussed the limitations, risks, security and privacy concerns of performing an evaluation and management service by telephone and the availability of in person appointments. I also discussed with the patient that there may be a patient responsible charge related to this service. The patient expressed understanding and agreed to proceed.   History and Present Illness:   Chief Complaint: Otalgia   HPI Patient calls in c/o left ear pain for over 3 weeks. She took antibiotics 3 weeks ago but that did not help. Pain when touching ear. Difficulty hearing. Does have tragus tenderness.      Review of Systems  Constitutional: Negative.   HENT: Positive for ear pain (left). Negative for ear discharge.   Eyes: Negative.   Respiratory: Negative.   Cardiovascular: Negative.   Genitourinary: Negative.   Skin: Negative.   Neurological: Negative.   Psychiatric/Behavioral: Negative.   All other systems reviewed and are negative.    Observations/Objective: Alert and oriented Left ear pain with tragus movement and wiggling of ear.  Assessment and Plan: Cheryl Reeves in today with chief complaint of Otalgia   1. Acute otitis externa of left ear, unspecified type Avoid getting water in ear Do not stick anything in ear - ofloxacin (FLOXIN OTIC) 0.3 % OTIC solution; Place 5 drops into the left ear daily.  Dispense: 10 mL; Refill: 0   Follow Up Instructions: Prn and if no better in 2-3 days    I discussed the assessment  and treatment plan with the patient. The patient was provided an opportunity to ask questions and all were answered. The patient agreed with the plan and demonstrated an understanding of the instructions.   The patient was advised to call back or seek an in-person evaluation if the symptoms worsen or if the condition fails to improve as anticipated.  The above assessment and management plan was discussed with the patient. The patient verbalized understanding of and has agreed to the management plan. Patient is aware to call the clinic if symptoms persist or worsen. Patient is aware when to return to the clinic for a follow-up visit. Patient educated on when it is appropriate to go to the emergency department.    I provided 8 minutes of non-face-to-face time during this encounter.    [name], FNP  Addendum antiobiotic changed due to expense Meds ordered this encounter  Medications  . DISCONTD: ofloxacin (FLOXIN OTIC) 0.3 % OTIC solution    Sig: Place 5 drops into the left ear daily.    Dispense:  10 mL    Refill:  0    Order Specific Question:   Supervising Provider    Answer:   Arville Care A [id number]  . NEOMYCIN-POLYMYXIN-HYDROCORTISONE (CORTISPORIN) 1 % SOLN OTIC solution    Sig: Place 3 drops into the left ear 4 (four) times daily.    Dispense:  10 mL    Refill:  0    Order Specific Question:   Supervising Provider    Answer:   Arville Care A [  [id number]]    

## 2018-09-22 NOTE — Telephone Encounter (Signed)
Fax from [facility] 90d request Carbamezepine Last OV for dx 02/28/18. 30 day RF 09/02/18 w/ NTBS. Acute OV today Please advise

## 2018-09-23 ENCOUNTER — Telehealth: Payer: Self-pay

## 2018-09-23 ENCOUNTER — Other Ambulatory Visit: Payer: Self-pay | Admitting: Nurse Practitioner

## 2018-09-23 NOTE — Telephone Encounter (Signed)
Patient could not afford ear drops and rx was changed to amoxicillin. She does not want this. She wants something stronger. Please review and advise

## 2018-09-23 NOTE — Telephone Encounter (Signed)
Please review and advise.

## 2018-09-23 NOTE — Telephone Encounter (Signed)
Meds were sent in . Discussed with patient and she verbalized understanding

## 2018-09-23 NOTE — Progress Notes (Signed)
Error

## 2018-10-01 ENCOUNTER — Other Ambulatory Visit: Payer: Self-pay | Admitting: Nurse Practitioner

## 2018-10-01 DIAGNOSIS — I10 Essential (primary) hypertension: Secondary | ICD-10-CM

## 2018-10-03 NOTE — Telephone Encounter (Signed)
LM to call for appt -jhb 

## 2018-10-03 NOTE — Telephone Encounter (Signed)
MMM. NTBS 30 days given 09/02/18

## 2018-10-13 ENCOUNTER — Other Ambulatory Visit: Payer: Self-pay

## 2018-10-13 ENCOUNTER — Ambulatory Visit (INDEPENDENT_AMBULATORY_CARE_PROVIDER_SITE_OTHER): Payer: PPO | Admitting: Nurse Practitioner

## 2018-10-13 ENCOUNTER — Encounter: Payer: Self-pay | Admitting: Nurse Practitioner

## 2018-10-13 DIAGNOSIS — G43909 Migraine, unspecified, not intractable, without status migrainosus: Secondary | ICD-10-CM | POA: Diagnosis not present

## 2018-10-13 DIAGNOSIS — F411 Generalized anxiety disorder: Secondary | ICD-10-CM

## 2018-10-13 DIAGNOSIS — K219 Gastro-esophageal reflux disease without esophagitis: Secondary | ICD-10-CM | POA: Diagnosis not present

## 2018-10-13 DIAGNOSIS — I10 Essential (primary) hypertension: Secondary | ICD-10-CM

## 2018-10-13 DIAGNOSIS — R569 Unspecified convulsions: Secondary | ICD-10-CM

## 2018-10-13 DIAGNOSIS — M21371 Foot drop, right foot: Secondary | ICD-10-CM | POA: Diagnosis not present

## 2018-10-13 DIAGNOSIS — F4310 Post-traumatic stress disorder, unspecified: Secondary | ICD-10-CM | POA: Diagnosis not present

## 2018-10-13 DIAGNOSIS — I6930 Unspecified sequelae of cerebral infarction: Secondary | ICD-10-CM

## 2018-10-13 MED ORDER — LISINOPRIL 10 MG PO TABS: 10.0000 mg | ORAL_TABLET | Freq: Every day | ORAL | 1 refills | Status: DC

## 2018-10-13 MED ORDER — ALPRAZOLAM 0.5 MG PO TABS: ORAL_TABLET | ORAL | 2 refills | Status: DC

## 2018-10-13 MED ORDER — CARBAMAZEPINE ER 200 MG PO CP12: 200.0000 mg | ORAL_CAPSULE | Freq: Two times a day (BID) | ORAL | 1 refills | Status: DC

## 2018-10-13 MED ORDER — BACLOFEN 10 MG PO TABS: 10.0000 mg | ORAL_TABLET | Freq: Two times a day (BID) | ORAL | 1 refills | Status: DC

## 2018-10-13 MED ORDER — BUPROPION HCL ER (XL) 300 MG PO TB24: 300.0000 mg | ORAL_TABLET | Freq: Every day | ORAL | 1 refills | Status: DC

## 2018-10-13 NOTE — Progress Notes (Signed)
Virtual Visit via telephone Note  I connected with Cheryl Reeves on 10/13/18 at 3:00 PM by telephone and verified that I am speaking with the correct person using two identifiers. Cheryl Reeves is currently located at home and nonone is currently with her during visit. The provider, Mary-Margaret Daphine DeutscherMartin, FNP is located in their office at time of visit.  I discussed the limitations, risks, security and privacy concerns of performing an evaluation and management service by telephone and the availability of in person appointments. I also discussed with the patient that there may be a patient responsible charge related to this service. The patient expressed understanding and agreed to proceed.   History and Present Illness:   Chief Complaint: medical management of chronic issues   HPI:  1. Essential hypertension No c/o chest pain, sob or headache. Does not check blood pressure at home. BP Readings from Last 3 Encounters:  06/23/18 (!) 161/92  02/28/18 127/78  02/24/18 125/87     2. Gastroesophageal reflux disease, esophagitis presence not specified Not having any symptoms. Is using TUMS OTC as needed  3. Late effect of cerebrovascular accident (CVA) Right sided weakness. Still taking baclofen for muscle spasms  4. Foot drop, right Foot drop causes her to walk with cane  5. Seizures (HCC) No recent seizure activity- is on carbatrol and is doing well.  6. Migraine without status migrainosus, not intractable, unspecified migraine type Has about 1 a month, but has constant sinus pressure.  7. GAD She is on xanax and wellbutrin and combination works well to keep us under control. She says she is dong well and is very busy taking care of her dad  Outpatient Encounter Medications as of 10/13/2018  Medication Sig  . ALPRAZolam (XANAX) 0.5 MG tablet TAKE 1 TABLET BY MOUTH TWICE A DAY AS NEEDED FOR ANXIETY  . aspirin 81 MG tablet Take 81 mg by mouth daily.  . baclofen  (LIORESAL) 10 MG tablet TAKE 1 TABLET BY MOUTH TWICE A DAY  . buPROPion (WELLBUTRIN XL) 300 MG 24 hr tablet Take 1 tablet (300 mg total) by mouth daily.  . carbamazepine (CARBATROL) 200 MG 12 hr capsule Take 1 capsule (200 mg total) by mouth 2 (two) times daily. (Needs to be seen before next refill)  . fluticasone (FLONASE) 50 MCG/ACT nasal spray USE 2 SPRAYS IN BOTH NOSTRILS DAILY (Patient taking differently: as needed. )  . ibuprofen (ADVIL,MOTRIN) 800 MG tablet TAKE 1 TABLET BY MOUTH EVERY 8 HOURS AS NEEDED  . lisinopril (PRINIVIL,ZESTRIL) 10 MG tablet TAKE 1 TABLET BY MOUTH EVERY DAY (NEEDS TO BE SEEN)  . NEOMYCIN-POLYMYXIN-HYDROCORTISONE (CORTISPORIN) 1 % SOLN OTIC solution Place 3 drops into the left ear 4 (four) times daily.  . permethrin (NIX CREME RINSE) 1 % liquid Apply to hair-leave on 10minutes lather the rinse.  . psyllium (HYDROCIL/METAMUCIL) 95 % PACK Take 1 packet by mouth daily. 1 tsp daily.     New complaints: None today  Social history: Lives with her new husband and her dad is living with her because he has cancer and needs to be cared for.      Review of Systems  Constitutional: Negative for diaphoresis and weight loss.  Eyes: Negative for blurred vision, double vision and pain.  Respiratory: Negative for shortness of breath.   Cardiovascular: Negative for chest pain, palpitations, orthopnea and leg swelling.  Gastrointestinal: Negative for abdominal pain.  Skin: Negative for rash.  Neurological: Negative for dizziness, sensory change, loss of consciousness,  weakness and headaches.  Endo/Heme/Allergies: Negative for polydipsia. Does not bruise/bleed easily.  Psychiatric/Behavioral: Negative for memory loss. The patient does not have insomnia.   All other systems reviewed and are negative.    Observations/Objective: Alert and oriented- answers all questions appropriately No distress  Assessment and Plan: Cheryl Reeves comes in today with chief  complaint of No chief complaint on file.   Diagnosis and orders addressed:  1. Essential hypertension Low sodium diet - lisinopril (ZESTRIL) 10 MG tablet; Take 1 tablet (10 mg total) by mouth daily.  Dispense: 90 tablet; Refill: 1  2. Gastroesophageal reflux disease, esophagitis presence not specified Avoid spicy foods Do not eat 2 hours prior to bedtime  3. Late effect of cerebrovascular accident (CVA) - baclofen (LIORESAL) 10 MG tablet; Take 1 tablet (10 mg total) by mouth 2 (two) times daily.  Dispense: 180 tablet; Refill: 1  4. Foot drop, right Fall precautions  5. Seizures (HCC) Report any seizure activity immediately - carbamazepine (CARBATROL) 200 MG 12 hr capsule; Take 1 capsule (200 mg total) by mouth 2 (two) times daily. (Needs to be seen before next refill)  Dispense: 180 capsule; Refill: 1  6. Migraine without status migrainosus, not intractable, unspecified migraine type Avoid caffeine  7. GAD (generalized anxiety disorder) Stress management - buPROPion (WELLBUTRIN XL) 300 MG 24 hr tablet; Take 1 tablet (300 mg total) by mouth daily.  Dispense: 90 tablet; Refill: 1  8. Post traumatic stress disorder - ALPRAZolam (XANAX) 0.5 MG tablet; TAKE 1 TABLET BY MOUTH TWICE A DAY AS NEEDED FOR ANXIETY  Dispense: 60 tablet; Refill: 2   Previous lab results reviewed Health Maintenance reviewed Diet and exercise encouraged  Follow up plan: 3 months     I discussed the assessment and treatment plan with the patient. The patient was provided an opportunity to ask questions and all were answered. The patient agreed with the plan and demonstrated an understanding of the instructions.   The patient was advised to call back or seek an in-person evaluation if the symptoms worsen or if the condition fails to improve as anticipated.  The above assessment and management plan was discussed with the patient. The patient verbalized understanding of and has agreed to the management  plan. Patient is aware to call the clinic if symptoms persist or worsen. Patient is aware when to return to the clinic for a follow-up visit. Patient educated on when it is appropriate to go to the emergency department.   Time call ended:  3:18  I provided 20 minutes of non-face-to-face time during this encounter.    [name], FNP

## 2019-01-09 ENCOUNTER — Other Ambulatory Visit: Payer: Self-pay | Admitting: Nurse Practitioner

## 2019-01-09 DIAGNOSIS — F4310 Post-traumatic stress disorder, unspecified: Secondary | ICD-10-CM

## 2019-01-10 ENCOUNTER — Other Ambulatory Visit: Payer: Self-pay | Admitting: Nurse Practitioner

## 2019-01-10 DIAGNOSIS — F4310 Post-traumatic stress disorder, unspecified: Secondary | ICD-10-CM

## 2019-01-10 MED ORDER — ALPRAZOLAM 0.5 MG PO TABS: ORAL_TABLET | ORAL | 0 refills | Status: DC

## 2019-01-10 NOTE — Telephone Encounter (Signed)
Patient aware.

## 2019-01-10 NOTE — Telephone Encounter (Signed)
1 month of xanax sent in- please make follow up appointmnet

## 2019-01-13 ENCOUNTER — Encounter: Payer: Self-pay | Admitting: Nurse Practitioner

## 2019-01-13 ENCOUNTER — Encounter: Payer: PPO | Admitting: Nurse Practitioner

## 2019-01-13 DIAGNOSIS — R569 Unspecified convulsions: Secondary | ICD-10-CM

## 2019-01-13 DIAGNOSIS — M21371 Foot drop, right foot: Secondary | ICD-10-CM

## 2019-01-13 DIAGNOSIS — J301 Allergic rhinitis due to pollen: Secondary | ICD-10-CM

## 2019-01-13 DIAGNOSIS — I6930 Unspecified sequelae of cerebral infarction: Secondary | ICD-10-CM

## 2019-01-13 DIAGNOSIS — K219 Gastro-esophageal reflux disease without esophagitis: Secondary | ICD-10-CM

## 2019-01-13 DIAGNOSIS — G43909 Migraine, unspecified, not intractable, without status migrainosus: Secondary | ICD-10-CM

## 2019-01-13 NOTE — Progress Notes (Signed)
Patient ID: Cheryl Reeves, female   DOB: [birth year], 48 y.o.   MRN: [id number]  Patient did not want televisit- wanted to be seen in person- appointmnet rescheduled for 8/18 at 8:15 AM

## 2019-01-20 ENCOUNTER — Ambulatory Visit: Payer: PPO | Admitting: Nurse Practitioner

## 2019-01-23 ENCOUNTER — Other Ambulatory Visit: Payer: Self-pay

## 2019-01-24 ENCOUNTER — Other Ambulatory Visit: Payer: Self-pay

## 2019-01-24 ENCOUNTER — Ambulatory Visit (INDEPENDENT_AMBULATORY_CARE_PROVIDER_SITE_OTHER): Payer: PPO | Admitting: Nurse Practitioner

## 2019-01-24 ENCOUNTER — Encounter: Payer: Self-pay | Admitting: Nurse Practitioner

## 2019-01-24 VITALS — BP 146/95 | HR 63 | Temp 98.0°F | Ht 64.0 in | Wt 156.0 lb

## 2019-01-24 DIAGNOSIS — M21371 Foot drop, right foot: Secondary | ICD-10-CM | POA: Diagnosis not present

## 2019-01-24 DIAGNOSIS — R569 Unspecified convulsions: Secondary | ICD-10-CM

## 2019-01-24 DIAGNOSIS — H65113 Acute and subacute allergic otitis media (mucoid) (sanguinous) (serous), bilateral: Secondary | ICD-10-CM | POA: Diagnosis not present

## 2019-01-24 DIAGNOSIS — I10 Essential (primary) hypertension: Secondary | ICD-10-CM | POA: Diagnosis not present

## 2019-01-24 DIAGNOSIS — I6930 Unspecified sequelae of cerebral infarction: Secondary | ICD-10-CM

## 2019-01-24 DIAGNOSIS — F411 Generalized anxiety disorder: Secondary | ICD-10-CM

## 2019-01-24 DIAGNOSIS — G43909 Migraine, unspecified, not intractable, without status migrainosus: Secondary | ICD-10-CM | POA: Diagnosis not present

## 2019-01-24 DIAGNOSIS — J0101 Acute recurrent maxillary sinusitis: Secondary | ICD-10-CM | POA: Diagnosis not present

## 2019-01-24 DIAGNOSIS — J019 Acute sinusitis, unspecified: Secondary | ICD-10-CM

## 2019-01-24 DIAGNOSIS — K219 Gastro-esophageal reflux disease without esophagitis: Secondary | ICD-10-CM | POA: Diagnosis not present

## 2019-01-24 DIAGNOSIS — F4310 Post-traumatic stress disorder, unspecified: Secondary | ICD-10-CM | POA: Diagnosis not present

## 2019-01-24 LAB — CMP14+EGFR
ALT: 9 IU/L (ref 0–32)
AST: 19 IU/L (ref 0–40)
Albumin/Globulin Ratio: 2 (ref 1.2–2.2)
Albumin: 4.8 g/dL (ref 3.8–4.8)
Alkaline Phosphatase: 102 IU/L (ref 39–117)
BUN/Creatinine Ratio: 12 (ref 9–23)
BUN: 9 mg/dL (ref 6–24)
Bilirubin Total: 0.5 mg/dL (ref 0.0–1.2)
CO2: 24 mmol/L (ref 20–29)
Calcium: 9.5 mg/dL (ref 8.7–10.2)
Chloride: 88 mmol/L — ABNORMAL LOW (ref 96–106)
Creatinine, Ser: 0.74 mg/dL (ref 0.57–1.00)
GFR calc Af Amer: 111 mL/min/{1.73_m2} (ref >59)
GFR calc non Af Amer: 96 mL/min/{1.73_m2} (ref >59)
Globulin, Total: 2.4 g/dL (ref 1.5–4.5)
Glucose: 81 mg/dL (ref 65–99)
Potassium: 4.6 mmol/L (ref 3.5–5.2)
Sodium: 127 mmol/L — ABNORMAL LOW (ref 134–144)
Total Protein: 7.2 g/dL (ref 6.0–8.5)

## 2019-01-24 LAB — LIPID PANEL
Chol/HDL Ratio: 2.9 ratio (ref 0.0–4.4)
Cholesterol, Total: 220 mg/dL — ABNORMAL HIGH (ref 100–199)
HDL: 75 mg/dL (ref >39)
LDL Calculated: 114 mg/dL — ABNORMAL HIGH (ref 0–99)
Triglycerides: 153 mg/dL — ABNORMAL HIGH (ref 0–149)
VLDL Cholesterol Cal: 31 mg/dL (ref 5–40)

## 2019-01-24 MED ORDER — FLUTICASONE PROPIONATE 50 MCG/ACT NA SUSP: NASAL | 2 refills | Status: DC

## 2019-01-24 MED ORDER — CARBAMAZEPINE ER 200 MG PO CP12: 200.0000 mg | ORAL_CAPSULE | Freq: Two times a day (BID) | ORAL | 1 refills | Status: DC

## 2019-01-24 MED ORDER — CEFDINIR 300 MG PO CAPS: 300.0000 mg | ORAL_CAPSULE | Freq: Two times a day (BID) | ORAL | 0 refills | Status: DC

## 2019-01-24 MED ORDER — ALPRAZOLAM 0.5 MG PO TABS: ORAL_TABLET | ORAL | 2 refills | Status: DC

## 2019-01-24 MED ORDER — LISINOPRIL 10 MG PO TABS: 10.0000 mg | ORAL_TABLET | Freq: Every day | ORAL | 1 refills | Status: DC

## 2019-01-24 MED ORDER — BUPROPION HCL ER (XL) 300 MG PO TB24: 300.0000 mg | ORAL_TABLET | Freq: Every day | ORAL | 1 refills | Status: DC

## 2019-01-24 NOTE — Progress Notes (Signed)
Subjective:    Patient ID: Cheryl Reeves, female    DOB: [birth year], 48 y.o.   MRN: [id number]   Chief Complaint: Medical Management of Chronic Issues    HPI:  1. Gastroesophageal reflux disease, esophagitis presence not specified Is not on any rx meds for this. Only uses OTC meds when needed  2. Late effect of cerebrovascular accident (CVA) Had stroke many years ago. Has right sided weakness  3. Foot drop, right Due to stroke, has had several fall in the past duet to her foot drop. No recent fall.  4. Seizures ([place]) Is on carbatrol and has o recent seizure activity. She says it has been years since she had one.  5. Migraine without status migrainosus, not intractable, unspecified migraine type She has had headache for th elast 6 days but not sure if migraine.    Outpatient Encounter Medications as of 01/24/2019  Medication Sig  . ALPRAZolam (XANAX) 0.5 MG tablet TAKE 1 TABLET BY MOUTH TWICE A DAY AS NEEDED FOR ANXIETY  . aspirin 81 MG tablet Take 81 mg by mouth daily.  . baclofen (LIORESAL) 10 MG tablet Take 1 tablet (10 mg total) by mouth 2 (two) times daily.  Marland Kitchen buPROPion (WELLBUTRIN XL) 300 MG 24 hr tablet Take 1 tablet (300 mg total) by mouth daily.  . carbamazepine (CARBATROL) 200 MG 12 hr capsule Take 1 capsule (200 mg total) by mouth 2 (two) times daily. (Needs to be seen before next refill)  . fluticasone (FLONASE) 50 MCG/ACT nasal spray USE 2 SPRAYS IN BOTH NOSTRILS DAILY (Patient taking differently: as needed. )  . ibuprofen (ADVIL,MOTRIN) 800 MG tablet TAKE 1 TABLET BY MOUTH EVERY 8 HOURS AS NEEDED  . lisinopril (ZESTRIL) 10 MG tablet Take 1 tablet (10 mg total) by mouth daily.  . NEOMYCIN-POLYMYXIN-HYDROCORTISONE (CORTISPORIN) 1 % SOLN OTIC solution Place 3 drops into the left ear 4 (four) times daily.  . permethrin (NIX CREME RINSE) 1 % liquid Apply to hair-leave on 3mnutes lather the rinse.  . psyllium (HYDROCIL/METAMUCIL) 95 % PACK Take 1 packet by  mouth daily. 1 tsp daily.     Past Surgical History:  Procedure Laterality Date  . ARTHROSCOPY KNEE W/ DRILLING  2008   rt  . REPLACEMENT TOTAL KNEE Right 2009  . SEPTOPLASTY Bilateral 07/21/2013   Procedure: SEPTOPLASTY;  Surgeon: JMelissa Montane MD;  [facility]  Service: ENT;  Laterality: Bilateral;  . SINUS ENDO W/FUSION Bilateral 07/21/2013   Procedure: ENDOSCOPIC SINUS SURGERY WITH FUSION NAVIGATION;  Surgeon: JMelissa Montane MD;  [facility]  Service: ENT;  Laterality: Bilateral;  . TUBAL LIGATION  2000    Family History  Problem Relation Age of Onset  . Cancer Mother        lung  . Arthritis Mother   . Heart disease Father   . Cancer Father        lung   . Hypertension Father   . Heart disease Brother   . Hypertension Brother   . Diabetes Unknown   . Arthritis/Rheumatoid Unknown     New complaints: She is c/o sinus pressure and headache for over a week.  Social history: Lives with her husband and her dad is currently living with her.  Controlled substance contract: 01/24/19     Review of Systems  Constitutional: Negative for activity change and appetite change.  HENT: Positive for congestion and sinus pressure. Negative for sore throat, trouble swallowing and voice change.  Eyes: Negative for pain.  Respiratory: Negative for shortness of breath.   Cardiovascular: Negative for chest pain, palpitations and leg swelling.  Gastrointestinal: Negative for abdominal pain.  Endocrine: Negative for polydipsia.  Genitourinary: Negative.   Skin: Negative for rash.  Neurological: Positive for headaches. Negative for dizziness and weakness.  Hematological: Does not bruise/bleed easily.  Psychiatric/Behavioral: Negative.   All other systems reviewed and are negative.      Objective:   Physical Exam Vitals signs and nursing note reviewed.  Constitutional:      General: She is not in acute distress.    Appearance: Normal  appearance. She is well-developed.  HENT:     Head: Normocephalic.     Right Ear: Hearing, tympanic membrane, ear canal and external ear normal.     Left Ear: Hearing, tympanic membrane, ear canal and external ear normal.     Nose: Congestion and rhinorrhea present. Rhinorrhea is purulent.     Right Turbinates: Enlarged.     Left Turbinates: Enlarged.     Right Sinus: Maxillary sinus tenderness present. No frontal sinus tenderness.     Left Sinus: Maxillary sinus tenderness present. No frontal sinus tenderness.     Mouth/Throat:     Lips: Pink.     Mouth: Mucous membranes are moist.  Eyes:     Pupils: Pupils are equal, round, and reactive to light.  Neck:     Musculoskeletal: Normal range of motion and neck supple.     Vascular: No carotid bruit or JVD.  Cardiovascular:     Rate and Rhythm: Normal rate and regular rhythm.     Heart sounds: Normal heart sounds.  Pulmonary:     Effort: Pulmonary effort is normal. No respiratory distress.     Breath sounds: Normal breath sounds. No wheezing or rales.  Chest:     Chest wall: No tenderness.  Abdominal:     General: Bowel sounds are normal. There is no distension or abdominal bruit.     Palpations: Abdomen is soft. There is no hepatomegaly, splenomegaly, mass or pulsatile mass.     Tenderness: There is no abdominal tenderness.  Musculoskeletal: Normal range of motion.  Lymphadenopathy:     Cervical: No cervical adenopathy.  Skin:    General: Skin is warm and dry.  Neurological:     Mental Status: She is alert and oriented to person, place, and time.     Deep Tendon Reflexes: Reflexes are normal and symmetric.  Psychiatric:        Behavior: Behavior normal.        Thought Content: Thought content normal.        Judgment: Judgment normal.     BP (!) 146/95   Pulse 63   Temp 98 F (36.7 C) (Oral)   Ht 5' 4"  (1.626 m)   Wt 156 lb (70.8 kg)   BMI 26.78 kg/m        Assessment & Plan:  [name] comes in today  with chief complaint of Medical Management of Chronic Issues   Diagnosis and orders addressed:  1. Gastroesophageal reflux disease, esophagitis presence not specified Avoid spicy foods Do not eat 2 hours prior to bedtime  2. Late effect of cerebrovascular accident (CVA)  3. Foot drop, right Fall precautions  4. Seizures (HCC) - carbamazepine (CARBATROL) 200 MG 12 hr capsule; Take 1 capsule (200 mg total) by mouth 2 (two) times daily. (Needs to be seen before next refill)  Dispense: 180 capsule; Refill: 1  5. Migraine without status migrainosus, not intractable, unspecified migraine type  6.  Acute recurrent maxillary sinusitis - cefdinir (OMNICEF) 300 MG capsule; Take 1 capsule (300 mg total) by mouth 2 (two) times daily. 1 po BID  Dispense: 20 capsule; Refill: 0  1. Take meds as prescribed 2. Use a cool mist humidifier especially during the winter months and when heat has been humid. 3. Use saline nose sprays frequently 4. Saline irrigations of the nose can be very helpful if done frequently.  * 4X daily for 1 week*  * Use of a nettie pot can be helpful with this. Follow directions with this* 5. Drink plenty of fluids 6. Keep thermostat turn down low 7.For any cough or congestion  Use plain Mucinex- regular strength or max strength is fine   * Children- consult with Pharmacist for dosing 8. For fever or aces or pains- take tylenol or ibuprofen appropriate for age and weight.  * for fevers greater than 101 orally you may alternate ibuprofen and tylenol every  3 hours.    - fluticasone (FLONASE) 50 MCG/ACT nasal spray; USE 2 SPRAYS IN BOTH NOSTRILS DAILY  Dispense: 48 g; Refill: 2  7. Essential hypertension Low sodium diet - lisinopril (ZESTRIL) 10 MG tablet; Take 1 tablet (10 mg total) by mouth daily.  Dispense: 90 tablet; Refill: 1 - CMP14+EGFR - Lipid panel  9. GAD (generalized anxiety disorder) Stress management - buPROPion (WELLBUTRIN XL) 300 MG 24 hr tablet; Take 1  tablet (300 mg total) by mouth daily.  Dispense: 90 tablet; Refill: 1  10. Post traumatic stress disorder - ALPRAZolam (XANAX) 0.5 MG tablet; TAKE 1 TABLET BY MOUTH TWICE A DAY AS NEEDED FOR ANXIETY  Dispense: 60 tablet; Refill: 2   Labs pending Health Maintenance reviewed Diet and exercise encouraged  Follow up plan: 6 months   Mary-Margaret Hassell Done, FNP

## 2019-01-24 NOTE — Patient Instructions (Signed)

## 2019-01-24 NOTE — Addendum Note (Signed)
Addended by: Rolena Infante on: 01/24/2019 09:19 AM   Modules accepted: Orders

## 2019-01-28 LAB — TOXASSURE SELECT 13 (MW), URINE

## 2019-02-09 ENCOUNTER — Other Ambulatory Visit: Payer: Self-pay | Admitting: Nurse Practitioner

## 2019-02-09 ENCOUNTER — Telehealth: Payer: Self-pay | Admitting: Nurse Practitioner

## 2019-02-09 DIAGNOSIS — F4310 Post-traumatic stress disorder, unspecified: Secondary | ICD-10-CM

## 2019-02-10 ENCOUNTER — Encounter: Payer: Self-pay | Admitting: *Deleted

## 2019-02-10 NOTE — Telephone Encounter (Signed)
Xanax refilled denied due  To drug screen contained cocaine and marijuana

## 2019-02-10 NOTE — Telephone Encounter (Signed)
Patient aware and states she would like to speak with MMM. Please call patient.

## 2019-02-14 NOTE — Telephone Encounter (Signed)
Her xanax was filled on 8/18 with 2 refills. I have not cancelled those refills at the pharmacy. Call and see if they have rx.

## 2019-02-14 NOTE — Telephone Encounter (Signed)
Mmm - see your previous note below = tox was not clear.

## 2019-02-14 NOTE — Telephone Encounter (Signed)
Patient has refills at pharmacy but will hav eto hav enegative drug screen in order to get any more prescriptions.

## 2019-02-14 NOTE — Telephone Encounter (Signed)
Called [facility] and pt = aware rx ready at the pharmacy.

## 2019-05-16 ENCOUNTER — Encounter: Payer: Self-pay | Admitting: Nurse Practitioner

## 2019-05-16 ENCOUNTER — Ambulatory Visit (INDEPENDENT_AMBULATORY_CARE_PROVIDER_SITE_OTHER): Payer: PPO | Admitting: Nurse Practitioner

## 2019-05-16 DIAGNOSIS — F321 Major depressive disorder, single episode, moderate: Secondary | ICD-10-CM

## 2019-05-16 DIAGNOSIS — F5101 Primary insomnia: Secondary | ICD-10-CM

## 2019-05-16 MED ORDER — ESCITALOPRAM OXALATE 20 MG PO TABS: 20.0000 mg | ORAL_TABLET | Freq: Every day | ORAL | 5 refills | Status: DC

## 2019-05-16 MED ORDER — RISPERIDONE 1 MG PO TABS: 1.0000 mg | ORAL_TABLET | Freq: Every day | ORAL | 3 refills | Status: DC

## 2019-05-16 NOTE — Progress Notes (Signed)
Virtual Visit via telephone Note Due to COVID-19 pandemic this visit was conducted virtually. This visit type was conducted due to national recommendations for restrictions regarding the COVID-19 Pandemic (e.g. social distancing, sheltering in place) in an effort to limit this patient's exposure and mitigate transmission in our community. All issues noted in this document were discussed and addressed.  A physical exam was not performed with this format.  I connected with Cheryl Reeves on 05/16/19 at 12:25 by telephone and verified that I am speaking with the correct person using two identifiers. Cheryl Reeves is currently located at home and her don  is currently with her during visit. The provider, Mary-Margaret Hassell Done, FNP is located in their office at time of visit.  I discussed the limitations, risks, security and privacy concerns of performing an evaluation and management service by telephone and the availability of in person appointments. I also discussed with the patient that there may be a patient responsible charge related to this service. The patient expressed understanding and agreed to proceed.   History and Present Illness:   Chief Complaint: Anxiety   HPI Patient is calling in c/o of anxiety . She is taking care of her ailing dad and is very anxious. They re calling hospice in for him right now. She still has a few xanax but she does not know what to do. She had positive drug screen for cocaine at last visit and was told we could no longer do xanax for her. She says that she has not been able to sleep at night because eof anxiety.  Depression screen [facility] 2/9 05/16/2019 01/24/2019 06/23/2018  Decreased Interest 2 0 0  Down, Depressed, Hopeless 2 0 1  PHQ - 2 Score 4 0 1  Altered sleeping 3 - -  Tired, decreased energy 2 - -  Change in appetite 0 - -  Feeling bad or failure about yourself  0 - -  Trouble concentrating 1 - -  Moving slowly or fidgety/restless 0 - -   Suicidal thoughts 0 - -  PHQ-9 Score 10 - -  Difficult doing work/chores Very difficult - -   GAD 7 : Generalized Anxiety Score 05/16/2019 07/05/2015  Nervous, Anxious, on Edge 2 1  Control/stop worrying 2 3  Worry too much - different things 3 3  Trouble relaxing 3 2  Restless 1 3  Easily annoyed or irritable 1 2  Afraid - awful might happen 3 0  Total GAD 7 Score 15 14  Anxiety Difficulty Very difficult Very difficult      Review of Systems  Constitutional: Negative.   Respiratory: Negative.   Cardiovascular: Negative.   Psychiatric/Behavioral: Positive for depression. Negative for suicidal ideas. The patient is nervous/anxious.   All other systems reviewed and are negative.    Observations/Objective: Alert and oriented- answers all questions appropriately Extreme  distress Crying a lot during conversation, very talkative   Assessment and Plan: Cheryl Reeves in today with chief complaint of Anxiety   1. Depression, major, single episode, moderate (HCC)/ with anxiety Continue wellbutrin as rx Added lexapro- side effects reviewed Stress management enocouraged - escitalopram (LEXAPRO) 20 MG tablet; Take 1 tablet (20 mg total) by mouth daily.  Dispense: 30 tablet; Refill: 5  2. Primary insomnia Bedtime routine - risperiDONE (RISPERDAL) 1 MG tablet; Take 1 tablet (1 mg total) by mouth at bedtime.  Dispense: 30 tablet; Refill: 3   Follow Up Instructions: prn    I discussed the assessment  and treatment plan with the patient. The patient was provided an opportunity to ask questions and all were answered. The patient agreed with the plan and demonstrated an understanding of the instructions.   The patient was advised to call back or seek an in-person evaluation if the symptoms worsen or if the condition fails to improve as anticipated.  The above assessment and management plan was discussed with the patient. The patient verbalized understanding of and has agreed  to the management plan. Patient is aware to call the clinic if symptoms persist or worsen. Patient is aware when to return to the clinic for a follow-up visit. Patient educated on when it is appropriate to go to the emergency department.   Time call ended:  12:36  I provided 11 minutes of non-face-to-face time during this encounter.    [name], FNP

## 2019-06-08 ENCOUNTER — Other Ambulatory Visit: Payer: Self-pay | Admitting: Nurse Practitioner

## 2019-06-08 DIAGNOSIS — F321 Major depressive disorder, single episode, moderate: Secondary | ICD-10-CM

## 2019-06-08 DIAGNOSIS — F5101 Primary insomnia: Secondary | ICD-10-CM

## 2019-07-27 ENCOUNTER — Ambulatory Visit: Payer: Self-pay | Admitting: Nurse Practitioner

## 2019-08-15 ENCOUNTER — Ambulatory Visit (INDEPENDENT_AMBULATORY_CARE_PROVIDER_SITE_OTHER): Payer: PPO | Admitting: Nurse Practitioner

## 2019-08-15 ENCOUNTER — Encounter: Payer: Self-pay | Admitting: Nurse Practitioner

## 2019-08-15 DIAGNOSIS — G8929 Other chronic pain: Secondary | ICD-10-CM | POA: Diagnosis not present

## 2019-08-15 DIAGNOSIS — R519 Headache, unspecified: Secondary | ICD-10-CM

## 2019-08-15 NOTE — Progress Notes (Signed)
   Virtual Visit via telephone Note Due to COVID-19 pandemic this visit was conducted virtually. This visit type was conducted due to national recommendations for restrictions regarding the COVID-19 Pandemic (e.g. social distancing, sheltering in place) in an effort to limit this patient's exposure and mitigate transmission in our community. All issues noted in this document were discussed and addressed.  A physical exam was not performed with this format.  I connected with Cheryl Reeves on 08/15/19 at 9:25 by telephone and verified that I am speaking with the correct person using two identifiers. Cheryl Reeves is currently located at home and no one is currently with her during visit. The provider, [name], FNP is located in their office at time of visit.  I discussed the limitations, risks, security and privacy concerns of performing an evaluation and management service by telephone and the availability of in person appointments. I also discussed with the patient that there may be a patient responsible charge related to this service. The patient expressed understanding and agreed to proceed.   History and Present Illness:   Chief Complaint: Edema   HPI Patient calls in c/o headaches. They can last for several days at a time. She use to see neurologist but has not been seen in several years so she was told that she needs referral. She had been taking nasal sprays, ibuprofen, goody powders and claritin d.   Review of Systems  Constitutional: Negative.   HENT: Positive for congestion.   Eyes: Negative for blurred vision, double vision, photophobia and pain.  Respiratory: Negative.   Cardiovascular: Negative.   Neurological: Positive for headaches.  Psychiatric/Behavioral: Negative.   All other systems reviewed and are negative.    Observations/Objective: Alert and oriented- answers all questions appropriately mild distress- currently rates headache 2/10.      Assessment and Plan: Cheryl Reeves in today with chief complaint of Edema   1. Chronic nonintractable headache, unspecified headache type Continue motrin and flonase No goody Powders - Ambulatory referral to Neurology    Follow Up Instructions: prn    I discussed the assessment and treatment plan with the patient. The patient was provided an opportunity to ask questions and all were answered. The patient agreed with the plan and demonstrated an understanding of the instructions.   The patient was advised to call back or seek an in-person evaluation if the symptoms worsen or if the condition fails to improve as anticipated.  The above assessment and management plan was discussed with the patient. The patient verbalized understanding of and has agreed to the management plan. Patient is aware to call the clinic if symptoms persist or worsen. Patient is aware when to return to the clinic for a follow-up visit. Patient educated on when it is appropriate to go to the emergency department.   Time call ended:  9:40  I provided 15 minutes of non-face-to-face time during this encounter.    [name], FNP

## 2019-08-22 ENCOUNTER — Telehealth: Payer: Self-pay | Admitting: Nurse Practitioner

## 2019-08-22 NOTE — Chronic Care Management (AMB) (Signed)
  Chronic Care Management   Note  08/22/2019 Name: Cheryl Reeves MRN: [id number] DOB: [birth year]  Cheryl Reeves is a 49 y.o. year old female who is a primary care patient of [name], FNP. I reached out to Cheryl Reeves by phone today in response to a referral sent by [name] health plan.     [name] was given information about Chronic Care Management services today including:  1. CCM service includes personalized support from designated clinical staff supervised by her physician, including individualized plan of care and coordination with other care providers 2. 24/7 contact phone numbers for assistance for urgent and routine care needs. 3. Service will only be billed when office clinical staff spend 20 minutes or more in a month to coordinate care. 4. Only one practitioner may furnish and bill the service in a calendar month. 5. The patient may stop CCM services at any time (effective at the end of the month) by phone call to the office staff. 6. The patient will be responsible for cost sharing (co-pay) of up to 20% of the service fee (after annual deductible is met).  Patient agreed to services and verbal consent obtained.   Follow up plan: Telephone appointment with care management team member scheduled for: 12/14/2019.  [place], [place] [address] Direct Dial: [phone] [email or web address]: [email or web address]

## 2019-09-15 ENCOUNTER — Other Ambulatory Visit: Payer: Self-pay | Admitting: Nurse Practitioner

## 2019-09-15 DIAGNOSIS — I6930 Unspecified sequelae of cerebral infarction: Secondary | ICD-10-CM

## 2019-09-19 ENCOUNTER — Other Ambulatory Visit: Payer: Self-pay | Admitting: Nurse Practitioner

## 2019-09-19 ENCOUNTER — Other Ambulatory Visit: Payer: Self-pay

## 2019-09-19 ENCOUNTER — Ambulatory Visit: Payer: PPO | Admitting: Neurology

## 2019-09-19 ENCOUNTER — Encounter: Payer: Self-pay | Admitting: Neurology

## 2019-09-19 VITALS — BP 158/92 | HR 80 | Temp 97.1°F | Ht 64.0 in | Wt 159.0 lb

## 2019-09-19 DIAGNOSIS — F411 Generalized anxiety disorder: Secondary | ICD-10-CM

## 2019-09-19 DIAGNOSIS — G43709 Chronic migraine without aura, not intractable, without status migrainosus: Secondary | ICD-10-CM

## 2019-09-19 DIAGNOSIS — G43909 Migraine, unspecified, not intractable, without status migrainosus: Secondary | ICD-10-CM | POA: Insufficient documentation

## 2019-09-19 DIAGNOSIS — IMO0002 Reserved for concepts with insufficient information to code with codable children: Secondary | ICD-10-CM

## 2019-09-19 DIAGNOSIS — R569 Unspecified convulsions: Secondary | ICD-10-CM

## 2019-09-19 MED ORDER — UBRELVY 50 MG PO TABS: 50.0000 mg | ORAL_TABLET | ORAL | 11 refills | Status: DC | PRN

## 2019-09-19 MED ORDER — VENLAFAXINE HCL ER 37.5 MG PO CP24: 37.5000 mg | ORAL_CAPSULE | Freq: Every day | ORAL | 11 refills | Status: DC

## 2019-09-19 NOTE — Progress Notes (Signed)
PATIENT: Cheryl Reeves DOB: 04/05/1971  Chief Complaint  Patient presents with  . Migraine    Last seen for migraines in 2018. Hx of CVA. She is having worsening headaches and suffers with one about five days each week. She was previously on nortriptyline but continued to have headaches. She really feels it is sinus related. She last saw ENT in 2013.   Marland Kitchen PCP    Cheryl Reeves, Cheryl Reeves is a 49 years old right-handed female, seen in refer by her primary care physician Dr. Redge Gainer for evaluation of frequent headaches, last visit was with Dr. Janann Colonel in 2015  She suffered stroke in 1999, was attributed to post partum hemorrhage, with mild aphasia, mild right upper extremity weakness, spastic gait right lower extremity weakness, she also had one seizure when she had a stroke, recurrent seizure in 2001, taking Tegretol 200 mg twice a day  She has frequent headaches even before she had stroke, contributed her sinus headache, had severe headache the day she had stroke, and persistent almost daily headache ever since, since 2014, she has moderate sinus pressure headaches, has been taking daily ibuprofen 10-12 tablets each day, has tried different over-the-counter sinus medications without helping her symptoms.  She never tried preventive medications in the past, she complains of excessive stress, her son just moved out from her house to live with her ex-husband, has difficulty sleeping, is taking Wellbutrin 150 mg every morning I have personally reviewed MRI of the brain in 2007, left frontal encephalomalacia  UPDATE Feb 25 2015:She only take one nortirptyline 25mg  qhs, instead of 2 tablets every night, which has been helpful, she no longer has constant headaches, but she has to take ibuprofen up to 5 tablets each time for her right side low back pain, she complains of right hip, low back pain, related to her abnormal gait, she denies shooting pain to  her right lower extremity  UPDATE September 19 2019: She has lost follow-up since last visit in June 2018, she came back complains of worsening headaches, up to 5 days each week, reviewing previous record nortriptyline up to 50 mg every night has been helpful,  She thought her migraine headache is sinus related, over the years, she has self medicated with over-the-counter sinus medications including daily Sudafed, multiple dose, Claritin, Mucinex sinus, Alka-Seltzer, ibuprofen up to 15 tablets daily, she complains of daily severe headaches, not function well because of significant headaches, holocranial, pressure, behind eye pain, light noise sensitivity  I personally reviewed CT head without contrast in March 2019 showed stable left frontal encephalomalacia and porencephaly, no acute intracranial abnormality CT of cervical spine showed multilevel degenerative disc disease,   MRI of brain in 2007:  No acute intracranial abnormality.  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    Laboratory evaluation in 2020,  elevated LDL 114, cholesterol 220, CMP showed mild decrease sodium 127,   REVIEW OF SYSTEMS: Full 14 system review of systems performed and notable only for as above All other review of systems were negative.  ALLERGIES: Allergies  Allergen Reactions  . Codeine     headaches    HOME MEDICATIONS: Current Outpatient Medications  Medication Sig Dispense Refill  . aspirin 81 MG tablet Take 81 mg by mouth daily.    . baclofen (LIORESAL) 10 MG tablet TAKE 1 TABLET BY MOUTH TWICE A DAY 180 tablet 1  . buPROPion (WELLBUTRIN XL)  300 MG 24 hr tablet Take 1 tablet (300 mg total) by mouth daily. 90 tablet 1  . carbamazepine (CARBATROL) 200 MG 12 hr capsule Take 1 capsule (200 mg total) by mouth 2 (two) times daily. (Needs to be seen before next refill) 180 capsule 1  . fluticasone (FLONASE) 50 MCG/ACT nasal spray USE 2 SPRAYS IN BOTH  NOSTRILS DAILY 48 g 2  . ibuprofen (ADVIL) 200 MG tablet Take 200-800 mg by mouth as needed.    Marland Kitchen lisinopril (ZESTRIL) 10 MG tablet Take 1 tablet (10 mg total) by mouth daily. 90 tablet 1  . psyllium (HYDROCIL/METAMUCIL) 95 % PACK Take 1 packet by mouth as needed. 1 tsp daily.     No current facility-administered medications for this visit.    PAST MEDICAL HISTORY: Past Medical History:  Diagnosis Date  . Allergy   . DJD (degenerative joint disease)   . GERD (gastroesophageal reflux disease)   . Headache   . Stroke [facility]) 1999   S/p childbirth-weakness rt leg  . Thoracic injuries   . Wears glasses     PAST SURGICAL HISTORY: Past Surgical History:  Procedure Laterality Date  . ARTHROSCOPY KNEE W/ DRILLING  2008   rt  . REPLACEMENT TOTAL KNEE Right 2009  . SEPTOPLASTY Bilateral 07/21/2013   Procedure: SEPTOPLASTY;  Surgeon: Suzanna Obey, MD;  Location: [facility];  Service: ENT;  Laterality: Bilateral;  . SINUS ENDO W/FUSION Bilateral 07/21/2013   Procedure: ENDOSCOPIC SINUS SURGERY WITH FUSION NAVIGATION;  Surgeon: Suzanna Obey, MD;  Location: [facility];  Service: ENT;  Laterality: Bilateral;  . TUBAL LIGATION  2000    FAMILY HISTORY: Family History  Problem Relation Age of Onset  . Cancer Mother        lung  . Arthritis Mother   . Heart disease Father   . Cancer Father        lung   . Hypertension Father   . Heart disease Brother   . Hypertension Brother   . Diabetes Other   . Arthritis/Rheumatoid Other     SOCIAL HISTORY: Social History   Socioeconomic History  . Marital status: Married    Spouse name: Not on file  . Number of children: 2  . Years of education: 10  . Highest education level: 9th grade  Occupational History  . Occupation: Disabled  Tobacco Use  . Smoking status: Current Every Day Smoker    Packs/day: 0.50    Types: Cigarettes    Last attempt to quit: 12/06/2011    Years since quitting: 7.7  . Smokeless tobacco:  Never Used  Substance and Sexual Activity  . Alcohol use: Yes    Alcohol/week: 0.0 standard drinks    Comment: occ  . Drug use: Yes    Types: Marijuana    Comment: occasionally  . Sexual activity: Yes  Other Topics Concern  . Not on file  Social History Narrative   Lives at home with husband and son.   Married, 1 son and 1 stepson    Right handed   10 th grade   3-4 cups caffeine per day.   Social Determinants of Health   Financial Resource Strain:   . Difficulty of Paying Living Expenses:   Food Insecurity:   . Worried About Programme researcher, broadcasting/film/video in the Last Year:   . Barista in the Last Year:   Transportation Needs:   . Freight forwarder (Medical):   Marland Kitchen Lack  of Transportation (Non-Medical):   Physical Activity:   . Days of Exercise per Week:   . Minutes of Exercise per Session:   Stress:   . Feeling of Stress :   Social Connections:   . Frequency of Communication with Friends and Family:   . Frequency of Social Gatherings with Friends and Family:   . Attends Religious Services:   . Active Member of Clubs or Organizations:   . Attends Banker Meetings:   Marland Kitchen Marital Status:   Intimate Partner Violence:   . Fear of Current or Ex-Partner:   . Emotionally Abused:   Marland Kitchen Physically Abused:   . Sexually Abused:      PHYSICAL EXAM   Vitals:   09/19/19 0841  BP: (!) 158/92  Pulse: 80  Temp: (!) 97.1 F (36.2 C)  Weight: 159 lb (72.1 kg)  Height: 5\' 4"  (1.626 m)    Not recorded      Body mass index is 27.29 kg/m.  PHYSICAL EXAMNIATION:  Gen: NAD, conversant, well nourised, well groomed                     Cardiovascular: Regular rate rhythm, no peripheral edema, warm, nontender. Eyes: Conjunctivae clear without exudates or hemorrhage Neck: Supple, no carotid bruits. Pulmonary: Clear to auscultation bilaterally   NEUROLOGICAL EXAM:  MENTAL STATUS: Speech:    Speech is normal; fluent and spontaneous with normal comprehension.    Cognition:     Orientation to time, place and person     Normal recent and remote memory     Normal Attention span and concentration     Normal Language, naming, repeating,spontaneous speech     Fund of knowledge   CRANIAL NERVES: CN II: Visual fields are full to confrontation. Pupils are round equal and briskly reactive to light. CN III, IV, VI: extraocular movement are normal. No ptosis. CN V: Facial sensation is intact to light touch CN VII: Face is symmetric with normal eye closure  CN VIII: Hearing is normal to causal conversation. CN IX, X: Phonation is normal. CN XI: Head turning and shoulder shrug are intact  MOTOR: There is no pronator drift of out-stretched arms. Muscle bulk and tone are normal. Muscle strength is normal.  REFLEXES: Reflexes are 2+ and symmetric at the biceps, triceps, knees, and ankles. Plantar responses are flexor.  SENSORY: Intact to light touch, pinprick and vibratory sensation are intact in fingers and toes.  COORDINATION: There is no trunk or limb dysmetria noted.  GAIT/STANCE: Posture is normal. Gait is steady with normal steps, base, arm swing, and turning. Heel and toe walking are normal. Tandem gait is normal.  Romberg is absent.   DIAGNOSTIC DATA (LABS, IMAGING, TESTING) - I reviewed patient records, labs, notes, testing and imaging myself where available.   ASSESSMENT AND PLAN  Cheryl Reeves is a 49 y.o. female   Chronic migraine headaches  Definitely with component of medicine rebound headaches,  She is already on polypharmacy treatment for her depression, and history of seizure, Wellbutrin, and carbamazepine,  Will start Effexor 37.5 mg daily, Inderal LA 80mg  daily as preventive medication  52 as needed for abortive treatment,  Not a candidate for triptan treatment due to history of stroke  I also advised her stop daily frequent over-the-counter analgesic use,  May consider CGRP antagonist as migraine  prevention History of stroke   In 1999, postpartum,  MRI, CT scan showed remote encephalomalacia involving left frontal lobe, with associated  porencephaly of the left lateral ventricle, remote blood products surrounding the area of porencephaly  History of seizure  Has been on stable dose of Tegretol 200 mg twice a day since 2001, sodium was low 127 in August 2020, she is very resistant to change,  May also consider Topamax, even Depakote as seizure and migraine preventive medications   Levert Feinstein, M.D. Ph.D.  [facility] [address] [place], [place] [address] Ph: [phone] Fax: [phone]  CC: Referring Provider

## 2019-09-20 ENCOUNTER — Telehealth: Payer: Self-pay | Admitting: Neurology

## 2019-09-20 NOTE — Telephone Encounter (Signed)
Pt is asking for a call from [place], [place] re: the medication changes that were discussed during her appointment with Dr Terrace Arabia, please call.

## 2019-09-20 NOTE — Telephone Encounter (Signed)
Left message requesting a return call.

## 2019-09-21 ENCOUNTER — Other Ambulatory Visit: Payer: Self-pay | Admitting: *Deleted

## 2019-09-21 MED ORDER — VENLAFAXINE HCL ER 37.5 MG PO CP24: 37.5000 mg | ORAL_CAPSULE | Freq: Every day | ORAL | 11 refills | Status: DC

## 2019-09-21 NOTE — Telephone Encounter (Addendum)
I spoke to pharmacist Theodoro Doing at [facility] [phone]). PA for Ubrelvy 50mg  completed over the phone. Pt . Approved through 06/07/20. Case 06/09/20.  I called the patient back and provided her with this update.

## 2019-09-22 MED ORDER — PROPRANOLOL HCL ER 80 MG PO CP24: 80.0000 mg | ORAL_CAPSULE | Freq: Every day | ORAL | 11 refills | Status: DC

## 2019-10-26 ENCOUNTER — Emergency Department (HOSPITAL_COMMUNITY): Payer: PPO

## 2019-10-26 ENCOUNTER — Other Ambulatory Visit: Payer: Self-pay

## 2019-10-26 ENCOUNTER — Encounter (HOSPITAL_COMMUNITY): Payer: Self-pay | Admitting: *Deleted

## 2019-10-26 ENCOUNTER — Emergency Department (HOSPITAL_COMMUNITY)
Admission: EM | Admit: 2019-10-26 | Discharge: 2019-10-26 | Disposition: A | Discharge status: Home / Self Care | Payer: PPO | Attending: Emergency Medicine | Admitting: Emergency Medicine

## 2019-10-26 DIAGNOSIS — R52 Pain, unspecified: Secondary | ICD-10-CM

## 2019-10-26 DIAGNOSIS — Y999 Unspecified external cause status: Secondary | ICD-10-CM | POA: Insufficient documentation

## 2019-10-26 DIAGNOSIS — F17210 Nicotine dependence, cigarettes, uncomplicated: Secondary | ICD-10-CM | POA: Diagnosis not present

## 2019-10-26 DIAGNOSIS — M7989 Other specified soft tissue disorders: Secondary | ICD-10-CM | POA: Diagnosis not present

## 2019-10-26 DIAGNOSIS — Y929 Unspecified place or not applicable: Secondary | ICD-10-CM | POA: Diagnosis not present

## 2019-10-26 DIAGNOSIS — Z7982 Long term (current) use of aspirin: Secondary | ICD-10-CM | POA: Insufficient documentation

## 2019-10-26 DIAGNOSIS — R600 Localized edema: Secondary | ICD-10-CM | POA: Diagnosis not present

## 2019-10-26 DIAGNOSIS — S3993XA Unspecified injury of pelvis, initial encounter: Secondary | ICD-10-CM | POA: Diagnosis not present

## 2019-10-26 DIAGNOSIS — S7011XA Contusion of right thigh, initial encounter: Secondary | ICD-10-CM | POA: Insufficient documentation

## 2019-10-26 DIAGNOSIS — Z96651 Presence of right artificial knee joint: Secondary | ICD-10-CM | POA: Diagnosis not present

## 2019-10-26 DIAGNOSIS — Y939 Activity, unspecified: Secondary | ICD-10-CM | POA: Insufficient documentation

## 2019-10-26 DIAGNOSIS — M79604 Pain in right leg: Secondary | ICD-10-CM | POA: Diagnosis not present

## 2019-10-26 DIAGNOSIS — W19XXXA Unspecified fall, initial encounter: Secondary | ICD-10-CM | POA: Diagnosis not present

## 2019-10-26 DIAGNOSIS — M25559 Pain in unspecified hip: Secondary | ICD-10-CM | POA: Diagnosis not present

## 2019-10-26 DIAGNOSIS — S79921A Unspecified injury of right thigh, initial encounter: Secondary | ICD-10-CM | POA: Diagnosis not present

## 2019-10-26 LAB — BASIC METABOLIC PANEL
Anion gap: 10 (ref 5–15)
BUN: 13 mg/dL (ref 6–20)
CO2: 28 mmol/L (ref 22–32)
Calcium: 9.2 mg/dL (ref 8.9–10.3)
Chloride: 101 mmol/L (ref 98–111)
Creatinine, Ser: 0.53 mg/dL (ref 0.44–1.00)
GFR calc Af Amer: >60 mL/min (ref >60)
GFR calc non Af Amer: >60 mL/min (ref >60)
Glucose, Bld: 94 mg/dL (ref 70–99)
Potassium: 4.7 mmol/L (ref 3.5–5.1)
Sodium: 139 mmol/L (ref 135–145)

## 2019-10-26 LAB — CBC
HCT: 37.9 % (ref 36.0–46.0)
Hemoglobin: 12.6 g/dL (ref 12.0–15.0)
MCH: 34.3 pg — ABNORMAL HIGH (ref 26.0–34.0)
MCHC: 33.2 g/dL (ref 30.0–36.0)
MCV: 103.3 fL — ABNORMAL HIGH (ref 80.0–100.0)
Platelets: 217 10*3/uL (ref 150–400)
RBC: 3.67 MIL/uL — ABNORMAL LOW (ref 3.87–5.11)
RDW: 12 % (ref 11.5–15.5)
WBC: 5.1 10*3/uL (ref 4.0–10.5)
nRBC: 0 % (ref 0.0–0.2)

## 2019-10-26 LAB — I-STAT BETA HCG BLOOD, ED (MC, WL, AP ONLY): I-stat hCG, quantitative: <5 m[IU]/mL (ref <5)

## 2019-10-26 MED ORDER — HYDROCODONE-ACETAMINOPHEN 5-325 MG PO TABS: 1.0000 | ORAL_TABLET | Freq: Four times a day (QID) | ORAL | 0 refills | Status: DC | PRN

## 2019-10-26 MED ORDER — HYDROCODONE-ACETAMINOPHEN 5-325 MG PO TABS
1.0000 | ORAL_TABLET | Freq: Once | ORAL | Status: AC
  Administered 2019-10-26: 1 via ORAL
  Filled 2019-10-26: qty 1

## 2019-10-26 NOTE — ED Triage Notes (Signed)
Pt with fall on Saturday with bruising and swelling to right leg, fell after her leg got caught in leg of bed while making the bed Saturday morning.  Denies any blood thinners, denies hitting her head.

## 2019-10-26 NOTE — Discharge Instructions (Signed)
Take the pain medication as needed.  Work-up here today no evidence of blood clot.  No evidence of any bony fractures.  Elevate leg is much as possible.

## 2019-11-04 ENCOUNTER — Other Ambulatory Visit: Payer: Self-pay | Admitting: Nurse Practitioner

## 2019-11-04 DIAGNOSIS — I10 Essential (primary) hypertension: Secondary | ICD-10-CM

## 2019-11-21 ENCOUNTER — Ambulatory Visit: Payer: PPO | Admitting: Neurology

## 2019-11-28 NOTE — ED Provider Notes (Signed)
[facility] EMERGENCY DEPARTMENT Provider Note   CSN: [id number] Arrival date & time: 10/26/19  1057     History Chief Complaint  Patient presents with  . Fall    Cheryl Reeves is a 49 y.o. female.  Patient had a fall on Saturday with bruising and swelling to right leg thigh area.  Patient not on any blood thinners.  No other injuries did not hit her head.  In addition patient denies any neck pain or back pain.  No upper extremity injuries.        Past Medical History:  Diagnosis Date  . Allergy   . DJD (degenerative joint disease)   . GERD (gastroesophageal reflux disease)   . Headache   . Stroke [facility]) 1999   S/p childbirth-weakness rt leg  . Thoracic injuries   . Wears glasses     Patient Active Problem List   Diagnosis Date Noted  . Chronic migraine 09/19/2019  . Carpal tunnel syndrome of left wrist 10/16/2016  . Migraines 06/12/2015  . Allergic rhinitis 10/09/2013  . GERD (gastroesophageal reflux disease) 10/09/2013  . Late effect of cerebrovascular accident (CVA) 10/09/2013  . Foot drop, right 10/09/2013  . Rosacea, acne 10/09/2013  . Seizures (HCC) 10/09/2013    Past Surgical History:  Procedure Laterality Date  . ARTHROSCOPY KNEE W/ DRILLING  2008   rt  . REPLACEMENT TOTAL KNEE Right 2009  . SEPTOPLASTY Bilateral 07/21/2013   Procedure: SEPTOPLASTY;  Surgeon: Suzanna Obey, MD;  Location: [facility];  Service: ENT;  Laterality: Bilateral;  . SINUS ENDO W/FUSION Bilateral 07/21/2013   Procedure: ENDOSCOPIC SINUS SURGERY WITH FUSION NAVIGATION;  Surgeon: Suzanna Obey, MD;  Location: [facility];  Service: ENT;  Laterality: Bilateral;  . TUBAL LIGATION  2000     OB History   No obstetric history on file.     Family History  Problem Relation Age of Onset  . Cancer Mother        lung  . Arthritis Mother   . Heart disease Father   . Cancer Father        lung   . Hypertension Father   . Heart disease Brother   .  Hypertension Brother   . Diabetes Other   . Arthritis/Rheumatoid Other     Social History   Tobacco Use  . Smoking status: Current Every Day Smoker    Packs/day: 0.50    Types: Cigarettes    Last attempt to quit: 12/06/2011    Years since quitting: 7.9  . Smokeless tobacco: Never Used  Vaping Use  . Vaping Use: Never used  Substance Use Topics  . Alcohol use: Yes    Alcohol/week: 0.0 standard drinks    Comment: occ  . Drug use: Yes    Types: Marijuana    Comment: occasionally    Home Medications Prior to Admission medications   Medication Sig Start Date End Date Taking? Authorizing Provider  aspirin 81 MG tablet Take 81 mg by mouth daily.    [provider]  baclofen (LIORESAL) 10 MG tablet TAKE 1 TABLET BY MOUTH TWICE A DAY 09/15/19   Daphine Deutscher, Mary-Margaret, FNP  buPROPion (WELLBUTRIN XL) 300 MG 24 hr tablet TAKE 1 TABLET BY MOUTH EVERY DAY 09/20/19   Daphine Deutscher, Mary-Margaret, FNP  carbamazepine (CARBATROL) 200 MG 12 hr capsule Take 1 capsule (200 mg total) by mouth 2 (two) times daily. (Needs to be seen before next refill) 01/24/19   Bennie Pierini, FNP  fluticasone (FLONASE) 50 MCG/ACT nasal spray USE 2 SPRAYS IN BOTH NOSTRILS DAILY 01/24/19   [name], FNP  HYDROcodone-acetaminophen (NORCO/VICODIN) 5-325 MG tablet Take 1 tablet by mouth every 6 (six) hours as needed. 10/26/19   Fredia Sorrow, MD  ibuprofen (ADVIL) 200 MG tablet Take 200-800 mg by mouth as needed.    [provider]  lisinopril (ZESTRIL) 10 MG tablet TAKE 1 TABLET BY MOUTH EVERY DAY 11/07/19   Hassell Done, Mary-Margaret, FNP  propranolol ER (INDERAL LA) 80 MG 24 hr capsule Take 1 capsule (80 mg total) by mouth daily. 09/22/19   Marcial Pacas, MD  psyllium (HYDROCIL/METAMUCIL) 95 % PACK Take 1 packet by mouth as needed. 1 tsp daily.    [provider]  Ubrogepant (UBRELVY) 50 MG TABS Take 50 mg by mouth as needed. May repeat once in 2 hours 09/19/19   Marcial Pacas, MD  venlafaxine XR  (EFFEXOR XR) 37.5 MG 24 hr capsule Take 1 capsule (37.5 mg total) by mouth at bedtime. 09/21/19   Marcial Pacas, MD    Allergies    Codeine  Review of Systems   Review of Systems  Constitutional: Negative for chills and fever.  HENT: Negative for rhinorrhea and sore throat.   Eyes: Negative for visual disturbance.  Respiratory: Negative for cough and shortness of breath.   Cardiovascular: Positive for leg swelling. Negative for chest pain.  Gastrointestinal: Negative for abdominal pain, diarrhea, nausea and vomiting.  Genitourinary: Negative for dysuria.  Musculoskeletal: Negative for back pain and neck pain.  Skin: Negative for rash.  Neurological: Negative for dizziness, light-headedness and headaches.  Hematological: Does not bruise/bleed easily.  Psychiatric/Behavioral: Negative for confusion.    Physical Exam Updated Vital Signs BP (!) 135/106   Pulse 71   Temp 97.9 F (36.6 C) (Oral)   Resp 17   Ht 1.651 m (5\' 5" )   Wt 72.6 kg   SpO2 99%   BMI 26.63 kg/m   Physical Exam Vitals and nursing note reviewed.  Constitutional:      General: She is not in acute distress.    Appearance: Normal appearance. She is well-developed.  HENT:     Head: Normocephalic and atraumatic.  Eyes:     Extraocular Movements: Extraocular movements intact.     Conjunctiva/sclera: Conjunctivae normal.     Pupils: Pupils are equal, round, and reactive to light.  Cardiovascular:     Rate and Rhythm: Normal rate and regular rhythm.     Heart sounds: No murmur heard.   Pulmonary:     Effort: Pulmonary effort is normal. No respiratory distress.     Breath sounds: Normal breath sounds.  Abdominal:     Palpations: Abdomen is soft.     Tenderness: There is no abdominal tenderness.  Musculoskeletal:        General: Swelling and tenderness present.     Cervical back: Normal range of motion and neck supple.     Comments: Patient with swelling and tenderness to the right thigh area.  Distally  neurovascularly intact.  Dorsalis pedis pulse 1-2+.  Sensation intact.  Skin:    General: Skin is warm and dry.     Capillary Refill: Capillary refill takes less than 2 seconds.  Neurological:     General: No focal deficit present.     Mental Status: She is alert and oriented to person, place, and time.     ED Results / Procedures / Treatments   Labs (all labs ordered are listed, but only  abnormal results are displayed) Labs Reviewed  CBC - Abnormal; Notable for the following components:      Result Value   RBC 3.67 (*)    MCV 103.3 (*)    MCH 34.3 (*)    All other components within normal limits  BASIC METABOLIC PANEL  I-STAT BETA HCG BLOOD, ED ([facility], [facility], [facility] ONLY)   Results for orders placed or performed during the hospital encounter of 10/26/19  CBC  Result Value Ref Range   WBC 5.1 4.0 - 10.5 K/uL   RBC 3.67 (L) 3.87 - 5.11 MIL/uL   Hemoglobin 12.6 12.0 - 15.0 g/dL   HCT 95.9 36 - 46 %   MCV 103.3 (H) 80.0 - 100.0 fL   MCH 34.3 (H) 26.0 - 34.0 pg   MCHC 33.2 30.0 - 36.0 g/dL   RDW 74.7 18.5 - 50.1 %   Platelets 217 150 - 400 K/uL   nRBC 0.0 0.0 - 0.2 %  Basic metabolic panel  Result Value Ref Range   Sodium 139 135 - 145 mmol/L   Potassium 4.7 3.5 - 5.1 mmol/L   Chloride 101 98 - 111 mmol/L   CO2 28 22 - 32 mmol/L   Glucose, Bld 94 70 - 99 mg/dL   BUN 13 6 - 20 mg/dL   Creatinine, Ser 5.86 0.44 - 1.00 mg/dL   Calcium 9.2 8.9 - 82.5 mg/dL   GFR calc non Af Amer >60 >60 mL/min   GFR calc Af Amer >60 >60 mL/min   Anion gap 10 5 - 15  I-Stat beta hCG blood, ED  Result Value Ref Range   I-stat hCG, quantitative <5.0 <5 mIU/mL   Comment 3           No results found.   EKG None  Radiology No results found.   Results for orders placed or performed during the hospital encounter of 10/26/19  CBC  Result Value Ref Range   WBC 5.1 4.0 - 10.5 K/uL   RBC 3.67 (L) 3.87 - 5.11 MIL/uL   Hemoglobin 12.6 12.0 - 15.0 g/dL   HCT 74.9 36 - 46 %   MCV 103.3 (H) 80.0 -  100.0 fL   MCH 34.3 (H) 26.0 - 34.0 pg   MCHC 33.2 30.0 - 36.0 g/dL   RDW 35.5 21.7 - 47.1 %   Platelets 217 150 - 400 K/uL   nRBC 0.0 0.0 - 0.2 %  Basic metabolic panel  Result Value Ref Range   Sodium 139 135 - 145 mmol/L   Potassium 4.7 3.5 - 5.1 mmol/L   Chloride 101 98 - 111 mmol/L   CO2 28 22 - 32 mmol/L   Glucose, Bld 94 70 - 99 mg/dL   BUN 13 6 - 20 mg/dL   Creatinine, Ser 5.95 0.44 - 1.00 mg/dL   Calcium 9.2 8.9 - 39.6 mg/dL   GFR calc non Af Amer >60 >60 mL/min   GFR calc Af Amer >60 >60 mL/min   Anion gap 10 5 - 15  I-Stat beta hCG blood, ED  Result Value Ref Range   I-stat hCG, quantitative <5.0 <5 mIU/mL   Comment 3           No results found.   Procedures Procedures (including critical care time)  Medications Ordered in ED Medications  HYDROcodone-acetaminophen (NORCO/VICODIN) 5-325 MG per tablet 1 tablet (1 tablet Oral Given 10/26/19 1501)    ED Course  I have reviewed the triage vital signs and the  nursing notes.  Pertinent labs & imaging results that were available during my care of the patient were reviewed by me and considered in my medical decision making (see chart for details).    MDM Rules/Calculators/A&P                         Work-up for the right thigh pain and burning without any acute findings.  Doppler studies negative for DVT.  Pelvic x-rays were negative and the right femur x-rays were negative.  This most likely represents a contusion.  Will treat symptomatically.  Final Clinical Impression(s) / ED Diagnoses Final diagnoses:  Contusion of right thigh, initial encounter    Rx / DC Orders ED Discharge Orders         Ordered    HYDROcodone-acetaminophen (NORCO/VICODIN) 5-325 MG tablet  Every 6 hours PRN     Discontinue  Reprint     10/26/19 1556           Vanetta Mulders, MD 11/28/19 1817

## 2019-12-14 ENCOUNTER — Ambulatory Visit: Payer: PPO | Admitting: *Deleted

## 2019-12-14 ENCOUNTER — Other Ambulatory Visit: Payer: Self-pay | Admitting: *Deleted

## 2019-12-14 DIAGNOSIS — R569 Unspecified convulsions: Secondary | ICD-10-CM

## 2019-12-14 NOTE — Chronic Care Management (AMB) (Signed)
  Chronic Care Management   Initial Visit Outreach  12/14/2019 Name: Cheryl Reeves MRN: [id number] DOB: 1970-10-17  Referred by: Bennie Pierini, FNP Reason for referral : Chronic Care Management (Initial Visit)   An unsuccessful Initial Telephone Visit was attempted today. The patient was referred to the case management team for assistance with care management and care coordination.   RN Care Plan   . Chronic Disease Management Needs       CARE PLAN ENTRY (see longtitudinal plan of care for additional care plan information)  Current Barriers:  . Chronic Disease Management support, education, and care coordination needs related to Seizures, late effects of CVA, migraines, right foot drop, GERD, DJD  Clinical Goals: . Over the next 10 days, patient will be contacted by a Care Guide to reschedule their Initial CCM Visit . Over the next 30 days, patient will have an Initial CCM Visit with a member of the embedded CCM team to discuss self-management of their chronic medical conditions  Interventions: . Chart reviewed in preparation for initial visit telephone call . Collaboration with other care team members as needed . Unsuccessful outreach to patient  . A HIPPA compliant phone message was left for the patient providing contact information and requesting a return call.  . Request sent to care guides to reach out and reschedule patient's initial visit  Patient Self Care Activities: . Undetermined   Initial goal documentation       Follow Up Plan: A HIPPA compliant phone message was left for the patient providing contact information and requesting a return call.  The care management team will reach out to the patient again over the next 10 days.   Demetrios Loll, BSN, RN-BC Embedded Chronic Care Manager [facility] Care Management Direct Dial: [phone]

## 2019-12-15 ENCOUNTER — Telehealth: Payer: Self-pay | Admitting: Nurse Practitioner

## 2019-12-15 MED ORDER — CARBAMAZEPINE ER 200 MG PO CP12: 200.0000 mg | ORAL_CAPSULE | Freq: Two times a day (BID) | ORAL | 0 refills | Status: DC

## 2019-12-15 NOTE — Chronic Care Management (AMB) (Signed)
  Chronic Care Management   Note  12/15/2019 Name: Cheryl Reeves MRN: [id number] DOB: June 07, 1971  Cheryl Reeves is a 49 y.o. year old female who is a primary care patient of Bennie Pierini, FNP and is actively engaged with the care management team. I reached out to Cheryl Reeves by phone today to assist with re-scheduling an initial visit with the RN Case Manager.  Follow up plan: Unsuccessful telephone outreach attempt made. A HIPPA compliant phone message was left for the patient providing contact information and requesting a return call.  The care management team will reach out to the patient again over the next 7 days.  If patient returns call to provider office, please advise to call Embedded Care Management Care Guide Gwenevere Ghazi at [phone].  Gwenevere Ghazi  Care Guide, Embedded Care Coordination [facility]  [place], [place] [address] Direct Dial: [phone] [email or web address]: [email or web address]

## 2019-12-22 NOTE — Chronic Care Management (AMB) (Signed)
  Chronic Care Management   Note  12/22/2019 Name: [name] MRN: [id number] DOB: 1971-04-02  Cheryl Reeves is a 49 y.o. year old female who is a primary care patient of Bennie Pierini, FNP and is actively engaged with the care management team. I reached out to Cheryl Reeves by phone today to assist with re-scheduling an initial visit with the RN Case Manager.  Follow up plan: A second unsuccessful telephone outreach attempt made. A HIPPA compliant phone message was left for the patient providing contact information and requesting a return call. The care management team will reach out to the patient again over the next 7 days. If patient returns call to provider office, please advise to call Embedded Care Management Care Guide Cheryl Reeves at [phone].  Cheryl Reeves  Care Guide, Embedded Care Coordination [facility]  [place], [place] [address] Direct Dial: [phone] [email or web address] Website: [email or web address]

## 2019-12-27 ENCOUNTER — Other Ambulatory Visit: Payer: Self-pay | Admitting: Nurse Practitioner

## 2019-12-27 DIAGNOSIS — F411 Generalized anxiety disorder: Secondary | ICD-10-CM

## 2019-12-28 NOTE — Telephone Encounter (Signed)
Last OV 05/16/2019. Last RF 30 day supply given today. Next OV not scheduled  ntbs for further refills

## 2020-01-02 NOTE — Progress Notes (Signed)
Pt is rescheduled for 02/09/2020

## 2020-01-02 NOTE — Chronic Care Management (AMB) (Signed)
  Chronic Care Management   Note  01/02/2020 Name: Cheryl Reeves MRN: [id number] DOB: 25-Sep-1970  Cheryl Reeves is a 49 y.o. year old female who is a primary care patient of Bennie Pierini, FNP and is actively engaged with the care management team. I reached out to Cheryl Reeves by phone today to assist with re-scheduling an initial visit with the RN Case Manager.  Follow up plan: Telephone appointment with care management team member scheduled for:02/09/2020  [facility] Guide, Embedded Care Coordination [facility]  [place], [place] [address] Direct Dial: [phone] [email or web address] Website: [email or web address]

## 2020-02-05 ENCOUNTER — Telehealth: Payer: Self-pay | Admitting: Nurse Practitioner

## 2020-02-05 DIAGNOSIS — F411 Generalized anxiety disorder: Secondary | ICD-10-CM

## 2020-02-05 MED ORDER — BUPROPION HCL ER (XL) 300 MG PO TB24: 300.0000 mg | ORAL_TABLET | Freq: Every day | ORAL | 0 refills | Status: DC

## 2020-02-05 NOTE — Telephone Encounter (Signed)
Pt has an appt on 9/23 and requesting to enough Wellbutrin to be sent to [facility] in [place] to last until that appt. She has 7 pills left.

## 2020-02-08 ENCOUNTER — Telehealth: Payer: Self-pay | Admitting: Nurse Practitioner

## 2020-02-08 ENCOUNTER — Other Ambulatory Visit: Payer: Self-pay | Admitting: Nurse Practitioner

## 2020-02-08 DIAGNOSIS — F411 Generalized anxiety disorder: Secondary | ICD-10-CM

## 2020-02-08 DIAGNOSIS — I10 Essential (primary) hypertension: Secondary | ICD-10-CM

## 2020-02-08 MED ORDER — BUPROPION HCL ER (XL) 300 MG PO TB24: 300.0000 mg | ORAL_TABLET | Freq: Every day | ORAL | 0 refills | Status: DC

## 2020-02-08 MED ORDER — LISINOPRIL 10 MG PO TABS: 10.0000 mg | ORAL_TABLET | Freq: Every day | ORAL | 0 refills | Status: DC

## 2020-02-08 NOTE — Telephone Encounter (Signed)
Pt called requesting that her BP medicine and her Welbutrin Rx be [facility]ed at [facility] [place] and be sent to [facility] in [place]. Pt says she requested this when she called the last time. [facility] not use [facility] anymore.

## 2020-02-08 NOTE — Telephone Encounter (Signed)
  Prescription Request  02/08/2020  What is the name of the medication or equipment? Welbutrin & Baclofen  Have you contacted your pharmacy to request a refill? (if applicable) Yes  Which pharmacy would you like this sent to? [facility]   Patient notified that their request is being sent to the clinical staff for review and that they should receive a response within 2 business days.   MMM's pt.  [facility] & can't [facility].  She needs her RX's to go to [facility]

## 2020-02-08 NOTE — Telephone Encounter (Signed)
Patient aware that rx sent. [facility] aware to d/c wellbutrin and lisinopril.

## 2020-02-09 ENCOUNTER — Telehealth: Payer: PPO

## 2020-02-21 ENCOUNTER — Other Ambulatory Visit: Payer: PPO

## 2020-02-21 ENCOUNTER — Other Ambulatory Visit: Payer: Self-pay | Admitting: Nurse Practitioner

## 2020-02-21 ENCOUNTER — Other Ambulatory Visit: Payer: Self-pay

## 2020-02-21 ENCOUNTER — Telehealth: Payer: Self-pay

## 2020-02-21 ENCOUNTER — Telehealth: Payer: Self-pay | Admitting: Nurse Practitioner

## 2020-02-21 DIAGNOSIS — R509 Fever, unspecified: Secondary | ICD-10-CM | POA: Diagnosis not present

## 2020-02-21 DIAGNOSIS — R300 Dysuria: Secondary | ICD-10-CM

## 2020-02-21 DIAGNOSIS — M545 Low back pain: Secondary | ICD-10-CM | POA: Diagnosis not present

## 2020-02-21 DIAGNOSIS — R42 Dizziness and giddiness: Secondary | ICD-10-CM | POA: Diagnosis not present

## 2020-02-21 DIAGNOSIS — N3000 Acute cystitis without hematuria: Secondary | ICD-10-CM

## 2020-02-21 LAB — URINALYSIS, COMPLETE
Bilirubin, UA: NEGATIVE
Glucose, UA: NEGATIVE
Ketones, UA: NEGATIVE
Nitrite, UA: NEGATIVE
Protein,UA: NEGATIVE
Specific Gravity, UA: 1.01 (ref 1.005–1.030)
Urobilinogen, Ur: 0.2 mg/dL (ref 0.2–1.0)
pH, UA: 7 (ref 5.0–7.5)

## 2020-02-21 LAB — MICROSCOPIC EXAMINATION: RBC, Urine: NONE SEEN /hpf (ref 0–2)

## 2020-02-21 MED ORDER — FLUCONAZOLE 150 MG PO TABS: 150.0000 mg | ORAL_TABLET | Freq: Once | ORAL | 0 refills | Status: AC

## 2020-02-21 MED ORDER — NITROFURANTOIN MONOHYD MACRO 100 MG PO CAPS
100.0000 mg | ORAL_CAPSULE | Freq: Two times a day (BID) | ORAL | 0 refills | Status: DC
Start: 2020-02-21 — End: 2020-03-01

## 2020-02-21 NOTE — Telephone Encounter (Signed)
Called patient to let her know we sent in an antibiotic and also diflucan for yeast to her pharmacy. She states that she is currently at urgent care and would let us know what they say.

## 2020-02-21 NOTE — Telephone Encounter (Signed)
Spoke with patient and no appts available today. Placed order for urine. She will get husband to drop it off around lunch time. Gave appt for a televisit tomorrow but patient is requesting meds today. She is in a lot of pain. She would also like a diflucan and a muscle relaxer if possible

## 2020-02-22 ENCOUNTER — Ambulatory Visit: Payer: PPO | Admitting: Family Medicine

## 2020-02-26 ENCOUNTER — Telehealth: Payer: Self-pay | Admitting: Nurse Practitioner

## 2020-02-26 NOTE — Telephone Encounter (Signed)
Pt asked to talk to nurse about kidney infection. Please call back

## 2020-02-26 NOTE — Telephone Encounter (Signed)
Patient is still experiencing dysuria even though she is taking Macrobid.  Wants to be seen.  Appointment scheduled for 02/27/2020 with [name].

## 2020-02-27 ENCOUNTER — Ambulatory Visit (INDEPENDENT_AMBULATORY_CARE_PROVIDER_SITE_OTHER): Payer: PPO | Admitting: Nurse Practitioner

## 2020-02-27 ENCOUNTER — Other Ambulatory Visit: Payer: Self-pay

## 2020-02-27 ENCOUNTER — Encounter: Payer: Self-pay | Admitting: Nurse Practitioner

## 2020-02-27 DIAGNOSIS — R569 Unspecified convulsions: Secondary | ICD-10-CM

## 2020-02-27 DIAGNOSIS — I10 Essential (primary) hypertension: Secondary | ICD-10-CM | POA: Diagnosis not present

## 2020-02-27 DIAGNOSIS — I6930 Unspecified sequelae of cerebral infarction: Secondary | ICD-10-CM | POA: Diagnosis not present

## 2020-02-27 DIAGNOSIS — N3000 Acute cystitis without hematuria: Secondary | ICD-10-CM

## 2020-02-27 DIAGNOSIS — K219 Gastro-esophageal reflux disease without esophagitis: Secondary | ICD-10-CM

## 2020-02-27 DIAGNOSIS — G43709 Chronic migraine without aura, not intractable, without status migrainosus: Secondary | ICD-10-CM | POA: Diagnosis not present

## 2020-02-27 DIAGNOSIS — IMO0002 Reserved for concepts with insufficient information to code with codable children: Secondary | ICD-10-CM

## 2020-02-27 DIAGNOSIS — F411 Generalized anxiety disorder: Secondary | ICD-10-CM | POA: Diagnosis not present

## 2020-02-27 MED ORDER — BUPROPION HCL ER (XL) 300 MG PO TB24: 300.0000 mg | ORAL_TABLET | Freq: Every day | ORAL | 0 refills | Status: DC

## 2020-02-27 MED ORDER — CEPHALEXIN 500 MG PO CAPS: 500.0000 mg | ORAL_CAPSULE | Freq: Two times a day (BID) | ORAL | 0 refills | Status: DC

## 2020-02-27 MED ORDER — LISINOPRIL 10 MG PO TABS: 10.0000 mg | ORAL_TABLET | Freq: Every day | ORAL | 0 refills | Status: DC

## 2020-02-27 MED ORDER — IBUPROFEN 800 MG PO TABS: 800.0000 mg | ORAL_TABLET | Freq: Three times a day (TID) | ORAL | 0 refills | Status: DC | PRN

## 2020-02-27 NOTE — Progress Notes (Signed)
Virtual Visit via telephone Note Due to COVID-19 pandemic this visit was conducted virtually. This visit type was conducted due to national recommendations for restrictions regarding the COVID-19 Pandemic (e.g. social distancing, sheltering in place) in an effort to limit this patient's exposure and mitigate transmission in our community. All issues noted in this document were discussed and addressed.  A physical exam was not performed with this format.  I connected with Cheryl Reeves on 02/27/20 at 11:15 by telephone and verified that I am speaking with the correct person using two identifiers. Cheryl Reeves is currently located at [facility] and no one is currently with  her during visit. The provider, [name], FNP is located in their office at time of visit.  I discussed the limitations, risks, security and privacy concerns of performing an evaluation and management service by telephone and the availability of in person appointments. I also discussed with the patient that there may be a patient responsible charge related to this service. The patient expressed understanding and agreed to proceed.   History and Present Illness:   Chief Complaint: Medical Management of Chronic Issues    HPI:  1. Late effect of cerebrovascular accident (CVA) She has permanent right sided weakness with right foot drop. Walks with a cane. She takes baclofen for her muscle spasms.  2. Seizures (HCC) Carbamazepine daily and has not had seizure since 2001  3. Gastroesophageal reflux disease, unspecified whether esophagitis present Only has occasionally  4. Chronic migraine No recent migraine activity  5. GAD (generalized anxiety disorder) Is currently on wellbutrin and effexor which helps with anxiety and her back pain.    Outpatient Encounter Medications as of 02/27/2020  Medication Sig  . aspirin 81 MG tablet Take 81 mg by mouth daily.  . baclofen (LIORESAL) 10 MG tablet TAKE 1  TABLET BY MOUTH TWICE A DAY  . buPROPion (WELLBUTRIN XL) 300 MG 24 hr tablet Take 1 tablet (300 mg total) by mouth daily. Needs appointment for further refills  . carbamazepine (CARBATROL) 200 MG 12 hr capsule Take 1 capsule (200 mg total) by mouth 2 (two) times daily.  . fluticasone (FLONASE) 50 MCG/ACT nasal spray USE 2 SPRAYS IN BOTH NOSTRILS DAILY  . HYDROcodone-acetaminophen (NORCO/VICODIN) 5-325 MG tablet Take 1 tablet by mouth every 6 (six) hours as needed.  Marland Kitchen ibuprofen (ADVIL) 200 MG tablet Take 200-800 mg by mouth as needed.  Marland Kitchen lisinopril (ZESTRIL) 10 MG tablet Take 1 tablet (10 mg total) by mouth daily.  . nitrofurantoin, macrocrystal-monohydrate, (MACROBID) 100 MG capsule Take 1 capsule (100 mg total) by mouth 2 (two) times daily. 1 po BId  . propranolol ER (INDERAL LA) 80 MG 24 hr capsule Take 1 capsule (80 mg total) by mouth daily.  . psyllium (HYDROCIL/METAMUCIL) 95 % PACK Take 1 packet by mouth as needed. 1 tsp daily.  Marland Kitchen Ubrogepant (UBRELVY) 50 MG TABS Take 50 mg by mouth as needed. May repeat once in 2 hours  . venlafaxine XR (EFFEXOR XR) 37.5 MG 24 hr capsule Take 1 capsule (37.5 mg total) by mouth at bedtime.     Past Surgical History:  Procedure Laterality Date  . ARTHROSCOPY KNEE W/ DRILLING  2008   rt  . REPLACEMENT TOTAL KNEE Right 2009  . SEPTOPLASTY Bilateral 07/21/2013   Procedure: SEPTOPLASTY;  Surgeon: Suzanna Obey, MD;  Location: [facility];  Service: ENT;  Laterality: Bilateral;  . SINUS ENDO W/FUSION Bilateral 07/21/2013   Procedure: ENDOSCOPIC SINUS SURGERY WITH FUSION  NAVIGATION;  Surgeon: Suzanna Obey, MD;  Location: [facility];  Service: ENT;  Laterality: Bilateral;  . TUBAL LIGATION  2000    Family History  Problem Relation Age of Onset  . Cancer Mother        lung  . Arthritis Mother   . Heart disease Father   . Cancer Father        lung   . Hypertension Father   . Heart disease Brother   . Hypertension Brother   .  Diabetes Other   . Arthritis/Rheumatoid Other     New complaints: Had UTI last week and was treated with macrobid. Has gotten some better. Culture grew gram negative cocci and macrobid should resolve it.  Social history: Lives with her husband  Controlled substance contract: n/a    Review of Systems  Constitutional: Negative for chills, diaphoresis, fever and weight loss.  Eyes: Negative for blurred vision, double vision and pain.  Respiratory: Negative for shortness of breath.   Cardiovascular: Negative for chest pain, palpitations, orthopnea and leg swelling.  Gastrointestinal: Negative for abdominal pain.  Genitourinary: Positive for frequency. Negative for dysuria and urgency.  Skin: Negative for rash.  Neurological: Negative for dizziness, sensory change, loss of consciousness, weakness and headaches.  Endo/Heme/Allergies: Negative for polydipsia. Does not bruise/bleed easily.  Psychiatric/Behavioral: Negative for memory loss. The patient does not have insomnia.   All other systems reviewed and are negative.    Observations/Objective: Alert and oriented- answers all questions appropriately mild distress    Assessment and Plan: Tonna Corner in today with chief complaint of Medical Management of Chronic Issues   1. Late effect of cerebrovascular accident (CVA) Fall prevention  2. Seizures (HCC) Continue meds  3. Gastroesophageal reflux disease, unspecified whether esophagitis present Avoid spicy foods Do not eat 2 hours prior to bedtime  4. Chronic migraine  5. GAD (generalized anxiety disorder) Stress management - buPROPion (WELLBUTRIN XL) 300 MG 24 hr tablet; Take 1 tablet (300 mg total) by mouth daily. Needs appointment for further refills  Dispense: 30 tablet; Refill: 0  6. Acute cystitis without hematuria added keflex Force fluids - cephALEXin (KEFLEX) 500 MG capsule; Take 1 capsule (500 mg total) by mouth 2 (two) times daily.  Dispense: 14  capsule; Refill: 0  7. Essential hypertension Low sodium diet - lisinopril (ZESTRIL) 10 MG tablet; Take 1 tablet (10 mg total) by mouth daily.  Dispense: 30 tablet; Refill: 0     Follow Up Instructions: 6 months    I discussed the assessment and treatment plan with the patient. The patient was provided an opportunity to ask questions and all were answered. The patient agreed with the plan and demonstrated an understanding of the instructions.   The patient was advised to call back or seek an in-person evaluation if the symptoms worsen or if the condition fails to improve as anticipated.  The above assessment and management plan was discussed with the patient. The patient verbalized understanding of and has agreed to the management plan. Patient is aware to call the clinic if symptoms persist or worsen. Patient is aware when to return to the clinic for a follow-up visit. Patient educated on when it is appropriate to go to the emergency department.   Time call ended:  11:37  I provided 22 minutes of non-face-to-face time during this encounter.    [name], FNP

## 2020-02-29 ENCOUNTER — Other Ambulatory Visit: Payer: Self-pay | Admitting: Nurse Practitioner

## 2020-02-29 ENCOUNTER — Ambulatory Visit: Payer: PPO | Admitting: Nurse Practitioner

## 2020-02-29 ENCOUNTER — Telehealth: Payer: Self-pay | Admitting: Nurse Practitioner

## 2020-02-29 LAB — URINE CULTURE

## 2020-02-29 NOTE — Telephone Encounter (Signed)
Pt was told by MMM to call back and give update on her kidney infection. Pt wanted to let MMM know that she is not doing any better other than she is not running a fever now.

## 2020-02-29 NOTE — Progress Notes (Unsigned)
co

## 2020-03-01 MED ORDER — CIPROFLOXACIN HCL 500 MG PO TABS
500.0000 mg | ORAL_TABLET | Freq: Two times a day (BID) | ORAL | 0 refills | Status: DC
Start: 2020-03-01 — End: 2020-03-12

## 2020-03-01 NOTE — Telephone Encounter (Signed)
Sent in cipro rx

## 2020-03-01 NOTE — Telephone Encounter (Signed)
Patient aware and verbalizes understanding. 

## 2020-03-12 ENCOUNTER — Ambulatory Visit (INDEPENDENT_AMBULATORY_CARE_PROVIDER_SITE_OTHER): Payer: PPO | Admitting: Nurse Practitioner

## 2020-03-12 ENCOUNTER — Encounter: Payer: Self-pay | Admitting: Nurse Practitioner

## 2020-03-12 ENCOUNTER — Other Ambulatory Visit: Payer: Self-pay

## 2020-03-12 VITALS — BP 157/104 | HR 85 | Temp 97.6°F | Resp 20 | Ht 65.0 in | Wt 151.0 lb

## 2020-03-12 DIAGNOSIS — I10 Essential (primary) hypertension: Secondary | ICD-10-CM

## 2020-03-12 DIAGNOSIS — F411 Generalized anxiety disorder: Secondary | ICD-10-CM | POA: Diagnosis not present

## 2020-03-12 DIAGNOSIS — K219 Gastro-esophageal reflux disease without esophagitis: Secondary | ICD-10-CM

## 2020-03-12 DIAGNOSIS — Z87440 Personal history of urinary (tract) infections: Secondary | ICD-10-CM | POA: Diagnosis not present

## 2020-03-12 DIAGNOSIS — G43709 Chronic migraine without aura, not intractable, without status migrainosus: Secondary | ICD-10-CM

## 2020-03-12 DIAGNOSIS — R569 Unspecified convulsions: Secondary | ICD-10-CM

## 2020-03-12 DIAGNOSIS — I6930 Unspecified sequelae of cerebral infarction: Secondary | ICD-10-CM

## 2020-03-12 DIAGNOSIS — M21371 Foot drop, right foot: Secondary | ICD-10-CM | POA: Diagnosis not present

## 2020-03-12 LAB — URINALYSIS
Bilirubin, UA: NEGATIVE
Glucose, UA: NEGATIVE
Ketones, UA: NEGATIVE
Nitrite, UA: NEGATIVE
Protein,UA: NEGATIVE
RBC, UA: NEGATIVE
Specific Gravity, UA: 1.015 (ref 1.005–1.030)
Urobilinogen, Ur: 0.2 mg/dL (ref 0.2–1.0)
pH, UA: 7 (ref 5.0–7.5)

## 2020-03-12 MED ORDER — PANTOPRAZOLE SODIUM 40 MG PO TBEC: 40.0000 mg | DELAYED_RELEASE_TABLET | Freq: Every day | ORAL | 1 refills | Status: DC

## 2020-03-12 MED ORDER — LISINOPRIL 20 MG PO TABS: 20.0000 mg | ORAL_TABLET | Freq: Every day | ORAL | 1 refills | Status: DC

## 2020-03-12 MED ORDER — BUPROPION HCL ER (XL) 300 MG PO TB24: 300.0000 mg | ORAL_TABLET | Freq: Every day | ORAL | 1 refills | Status: DC

## 2020-03-12 NOTE — Patient Instructions (Signed)

## 2020-03-12 NOTE — Progress Notes (Signed)
Subjective:    Patient ID: Cheryl Reeves, female    DOB: 01-21-1971, 49 y.o.   MRN: [id number]   Chief Complaint: Medical Management of Chronic Issues    HPI:  1. Recent urinary tract infection patient is still on antibiotics. This is her 3rd antibiotic for this UTI. She is now on cipro and is doing some better. sheis on ppropranolol for preventative. She recently started on ubrelvy which is not helping er she says.  2. Chronic migraine without aura without status migrainosus, not intractable Just occasional. She is seeing specialist .  3. Gastroesophageal reflux disease, unspecified whether esophagitis present Not taking any prescription meds. She has been taking her husbands protonix.  4. Seizures ([place]) Still on carbamazepeam 1 daily. Has not had any seizure activity since 2002.  5. Late effect of cerebrovascular accident (CVA) Has right sided weakness. othr wise is doing well.  6. Foot drop, right Walks with a cane to keep her from falling.  7 hypertension No c/o chest pain, sob or headache. Checks it at home and has been running high. Over 010 systolic.  BP Readings from Last 3 Encounters:  03/12/20 (!) 157/104  10/26/19 (!) 135/106  09/19/19 (!) 158/92    Outpatient Encounter Medications as of 03/12/2020  Medication Sig  . aspirin 81 MG tablet Take 81 mg by mouth daily.  . baclofen (LIORESAL) 10 MG tablet TAKE 1 TABLET BY MOUTH TWICE A DAY  . buPROPion (WELLBUTRIN XL) 300 MG 24 hr tablet Take 1 tablet (300 mg total) by mouth daily. Needs appointment for further refills  . carbamazepine (CARBATROL) 200 MG 12 hr capsule Take 1 capsule (200 mg total) by mouth 2 (two) times daily.  . cephALEXin (KEFLEX) 500 MG capsule Take 1 capsule (500 mg total) by mouth 2 (two) times daily.  Marland Kitchen ibuprofen (ADVIL) 800 MG tablet Take 1 tablet (800 mg total) by mouth every 8 (eight) hours as needed.  Marland Kitchen lisinopril (ZESTRIL) 10 MG tablet Take 1 tablet (10 mg total) by mouth daily.    . propranolol ER (INDERAL LA) 80 MG 24 hr capsule Take 1 capsule (80 mg total) by mouth daily.  . psyllium (HYDROCIL/METAMUCIL) 95 % PACK Take 1 packet by mouth as needed. 1 tsp daily.  Marland Kitchen Ubrogepant (UBRELVY) 50 MG TABS Take 50 mg by mouth as needed. May repeat once in 2 hours  . venlafaxine XR (EFFEXOR XR) 37.5 MG 24 hr capsule Take 1 capsule (37.5 mg total) by mouth at bedtime.     Past Surgical History:  Procedure Laterality Date  . ARTHROSCOPY KNEE W/ DRILLING  2008   rt  . REPLACEMENT TOTAL KNEE Right 2009  . SEPTOPLASTY Bilateral 07/21/2013   Procedure: SEPTOPLASTY;  Surgeon: Melissa Montane, MD;  Location: [facility];  Service: ENT;  Laterality: Bilateral;  . SINUS ENDO W/FUSION Bilateral 07/21/2013   Procedure: ENDOSCOPIC SINUS SURGERY WITH FUSION NAVIGATION;  Surgeon: Melissa Montane, MD;  Location: [facility];  Service: ENT;  Laterality: Bilateral;  . TUBAL LIGATION  2000    Family History  Problem Relation Age of Onset  . Cancer Mother        lung  . Arthritis Mother   . Heart disease Father   . Cancer Father        lung   . Hypertension Father   . Heart disease Brother   . Hypertension Brother   . Diabetes Other   . Arthritis/Rheumatoid Other     New  complaints: None today  Social history: Lives with her husband- is on disability 10/5/121 Controlled substance contract: n/a    Review of Systems  Constitutional: Negative for diaphoresis.  Eyes: Negative for pain.  Respiratory: Negative for shortness of breath.   Cardiovascular: Negative for chest pain, palpitations and leg swelling.  Gastrointestinal: Negative for abdominal pain.  Endocrine: Negative for polydipsia.  Genitourinary: Positive for frequency and urgency.  Musculoskeletal: Positive for myalgias.  Skin: Negative for rash.  Neurological: Negative for dizziness, weakness and headaches.  Hematological: Does not bruise/bleed easily.  All other systems reviewed and are  negative.      Objective:   Physical Exam Vitals and nursing note reviewed.  Constitutional:      General: She is not in acute distress.    Appearance: Normal appearance. She is well-developed.  HENT:     Head: Normocephalic.     Nose: Nose normal.  Eyes:     Pupils: Pupils are equal, round, and reactive to light.  Neck:     Vascular: No carotid bruit or JVD.  Cardiovascular:     Rate and Rhythm: Normal rate and regular rhythm.     Heart sounds: Normal heart sounds.  Pulmonary:     Effort: Pulmonary effort is normal. No respiratory distress.     Breath sounds: Normal breath sounds. No wheezing or rales.  Chest:     Chest wall: No tenderness.  Abdominal:     General: Bowel sounds are normal. There is no distension or abdominal bruit.     Palpations: Abdomen is soft. There is no hepatomegaly, splenomegaly, mass or pulsatile mass.     Tenderness: There is no abdominal tenderness.  Musculoskeletal:        General: Normal range of motion.     Cervical back: Normal range of motion and neck supple.     Comments: Right foot drop Walking with a cane  Lymphadenopathy:     Cervical: No cervical adenopathy.  Skin:    General: Skin is warm and dry.  Neurological:     Mental Status: She is alert and oriented to person, place, and time.     Deep Tendon Reflexes: Reflexes are normal and symmetric.  Psychiatric:        Behavior: Behavior normal.        Thought Content: Thought content normal.        Judgment: Judgment normal.    BP (!) 157/104   Pulse 85   Temp 97.6 F (36.4 C) (Temporal)   Resp 20   Ht _0  (1.651 m)   Wt 151 lb (68.5 kg)   SpO2 97%   BMI 25.13 kg/m        Assessment & Plan:  [name] comes in today with chief complaint of Medical Management of Chronic Issues   Diagnosis and orders addressed:  1. Recent urinary tract infection Waiting on results - Urinalysis - Urine Culture  2. Chronic migraine without aura without status  migrainosus, not intractable Avoid caffeine  3. Gastroesophageal reflux disease, unspecified whether esophagitis present Avoid spicy foods Do not eat 2 hours prior to bedtime - pantoprazole (PROTONIX) 40 MG tablet; Take 1 tablet (40 mg total) by mouth daily.  Dispense: 90 tablet; Refill: 1  4. Seizures ([place])  5. Late effect of cerebrovascular accident (CVA) Fall prevention  6. Foot drop, right  7. Essential hypertension Low sodium diet - lisinopril (ZESTRIL) 20 MG tablet; Take 1 tablet (20 mg total) by mouth daily.  Dispense: 90 tablet; Refill: 1 - CBC with Differential/Platelet - CMP14+EGFR - Lipid panel  8. GAD (generalized anxiety disorder) Stress management - buPROPion (WELLBUTRIN XL) 300 MG 24 hr tablet; Take 1 tablet (300 mg total) by mouth daily. Needs appointment for further refills  Dispense: 90 tablet; Refill: 1   Labs pending Health Maintenance reviewed Diet and exercise encouraged  Follow up plan: 6 months   Mary-Margaret Hassell Done, FNP

## 2020-03-13 LAB — CBC WITH DIFFERENTIAL/PLATELET
Basophils Absolute: 0 10*3/uL (ref 0.0–0.2)
Basos: 1 %
EOS (ABSOLUTE): 0.1 10*3/uL (ref 0.0–0.4)
Eos: 1 %
Hematocrit: 38.3 % (ref 34.0–46.6)
Hemoglobin: 13.1 g/dL (ref 11.1–15.9)
Immature Grans (Abs): 0 10*3/uL (ref 0.0–0.1)
Immature Granulocytes: 0 %
Lymphocytes Absolute: 1.6 10*3/uL (ref 0.7–3.1)
Lymphs: 22 %
MCH: 34.3 pg — ABNORMAL HIGH (ref 26.6–33.0)
MCHC: 34.2 g/dL (ref 31.5–35.7)
MCV: 100 fL — ABNORMAL HIGH (ref 79–97)
Monocytes Absolute: 0.5 10*3/uL (ref 0.1–0.9)
Monocytes: 7 %
Neutrophils Absolute: 5 10*3/uL (ref 1.4–7.0)
Neutrophils: 69 %
Platelets: 335 10*3/uL (ref 150–450)
RBC: 3.82 x10E6/uL (ref 3.77–5.28)
RDW: 12.4 % (ref 11.7–15.4)
WBC: 7.2 10*3/uL (ref 3.4–10.8)

## 2020-03-13 LAB — CMP14+EGFR
ALT: 7 IU/L (ref 0–32)
AST: 19 IU/L (ref 0–40)
Albumin/Globulin Ratio: 1.5 (ref 1.2–2.2)
Albumin: 4.3 g/dL (ref 3.8–4.8)
Alkaline Phosphatase: 131 IU/L — ABNORMAL HIGH (ref 44–121)
BUN/Creatinine Ratio: 16 (ref 9–23)
BUN: 13 mg/dL (ref 6–24)
Bilirubin Total: 0.4 mg/dL (ref 0.0–1.2)
CO2: 25 mmol/L (ref 20–29)
Calcium: 9.4 mg/dL (ref 8.7–10.2)
Chloride: 98 mmol/L (ref 96–106)
Creatinine, Ser: 0.82 mg/dL (ref 0.57–1.00)
GFR calc Af Amer: 97 mL/min/{1.73_m2} (ref >59)
GFR calc non Af Amer: 84 mL/min/{1.73_m2} (ref >59)
Globulin, Total: 2.9 g/dL (ref 1.5–4.5)
Glucose: 96 mg/dL (ref 65–99)
Potassium: 4.7 mmol/L (ref 3.5–5.2)
Sodium: 137 mmol/L (ref 134–144)
Total Protein: 7.2 g/dL (ref 6.0–8.5)

## 2020-03-13 LAB — LIPID PANEL
Chol/HDL Ratio: 4 ratio (ref 0.0–4.4)
Cholesterol, Total: 252 mg/dL — ABNORMAL HIGH (ref 100–199)
HDL: 63 mg/dL (ref >39)
LDL Chol Calc (NIH): 138 mg/dL — ABNORMAL HIGH (ref 0–99)
Triglycerides: 288 mg/dL — ABNORMAL HIGH (ref 0–149)
VLDL Cholesterol Cal: 51 mg/dL — ABNORMAL HIGH (ref 5–40)

## 2020-03-14 LAB — URINE CULTURE

## 2020-03-15 ENCOUNTER — Telehealth: Payer: Self-pay

## 2020-03-15 MED ORDER — ATORVASTATIN CALCIUM 20 MG PO TABS
20.0000 mg | ORAL_TABLET | Freq: Every day | ORAL | 0 refills | Status: DC
Start: 2020-03-15 — End: 2020-03-18

## 2020-03-15 NOTE — Telephone Encounter (Signed)
Cheryl Reeves patient, see her note on 03/13/2020 from the lipid panel on 03/12/20.  Patient is willing to start medication for cholesterol.

## 2020-03-15 NOTE — Telephone Encounter (Signed)
Cosign Lipitor 20mg  #90 no RF to MMM.  Please make sure she has 41m f/u for Lipid and CMP

## 2020-03-15 NOTE — Telephone Encounter (Signed)
Pt called to get lab results. Explained to pt that per MMM note, her urine culture result wasn't back yet. Reviewed other lab result with pt per MMM's note. Pt voiced understanding and said she is ok with taking either a statin or fenofibrate as long as MMM thought it was best.

## 2020-03-15 NOTE — Telephone Encounter (Signed)
Patient informed that Lipitor 20 mg will be sent in to [facility].  3 month follow up appointment scheduled with [name] on 06/08/2020.

## 2020-03-17 ENCOUNTER — Other Ambulatory Visit: Payer: Self-pay | Admitting: Nurse Practitioner

## 2020-03-28 ENCOUNTER — Ambulatory Visit: Payer: PPO | Admitting: Nurse Practitioner

## 2020-06-11 ENCOUNTER — Other Ambulatory Visit: Payer: Self-pay | Admitting: Nurse Practitioner

## 2020-06-11 DIAGNOSIS — K219 Gastro-esophageal reflux disease without esophagitis: Secondary | ICD-10-CM

## 2020-06-11 DIAGNOSIS — I10 Essential (primary) hypertension: Secondary | ICD-10-CM

## 2020-06-11 DIAGNOSIS — I6930 Unspecified sequelae of cerebral infarction: Secondary | ICD-10-CM

## 2020-06-11 NOTE — Telephone Encounter (Signed)
  Prescription Request  06/11/2020  What is the name of the medication or equipment? carbamazethine  Have you contacted your pharmacy to request a refill? (if applicable) yes  Which pharmacy would you like this sent to? [facility]   Patient notified that their request is being sent to the clinical staff for review and that they should receive a response within 2 business days.

## 2020-06-13 ENCOUNTER — Telehealth: Payer: Self-pay | Admitting: Nurse Practitioner

## 2020-06-13 DIAGNOSIS — R569 Unspecified convulsions: Secondary | ICD-10-CM

## 2020-06-13 MED ORDER — CARBAMAZEPINE ER 200 MG PO CP12: 200.0000 mg | ORAL_CAPSULE | Freq: Two times a day (BID) | ORAL | 0 refills | Status: DC

## 2020-06-13 NOTE — Telephone Encounter (Signed)
Pt is calling to ask about her carbamazepine being refilled, pt has appt 1/10

## 2020-06-13 NOTE — Telephone Encounter (Signed)
30 day refill sent in, patient informed

## 2020-06-17 ENCOUNTER — Encounter: Payer: Self-pay | Admitting: Nurse Practitioner

## 2020-06-17 ENCOUNTER — Other Ambulatory Visit: Payer: Self-pay

## 2020-06-17 ENCOUNTER — Ambulatory Visit (INDEPENDENT_AMBULATORY_CARE_PROVIDER_SITE_OTHER): Payer: PPO | Admitting: Nurse Practitioner

## 2020-06-17 VITALS — BP 156/94 | HR 71 | Temp 97.8°F | Resp 20 | Ht 65.0 in | Wt 154.0 lb

## 2020-06-17 DIAGNOSIS — E7800 Pure hypercholesterolemia, unspecified: Secondary | ICD-10-CM

## 2020-06-17 DIAGNOSIS — K219 Gastro-esophageal reflux disease without esophagitis: Secondary | ICD-10-CM

## 2020-06-17 DIAGNOSIS — I10 Essential (primary) hypertension: Secondary | ICD-10-CM

## 2020-06-17 DIAGNOSIS — I6930 Unspecified sequelae of cerebral infarction: Secondary | ICD-10-CM | POA: Diagnosis not present

## 2020-06-17 DIAGNOSIS — G43709 Chronic migraine without aura, not intractable, without status migrainosus: Secondary | ICD-10-CM

## 2020-06-17 DIAGNOSIS — F411 Generalized anxiety disorder: Secondary | ICD-10-CM | POA: Diagnosis not present

## 2020-06-17 DIAGNOSIS — R569 Unspecified convulsions: Secondary | ICD-10-CM | POA: Diagnosis not present

## 2020-06-17 DIAGNOSIS — M21371 Foot drop, right foot: Secondary | ICD-10-CM | POA: Diagnosis not present

## 2020-06-17 LAB — CMP14+EGFR
ALT: 10 IU/L (ref 0–32)
AST: 21 IU/L (ref 0–40)
Albumin/Globulin Ratio: 1.5 (ref 1.2–2.2)
Albumin: 4.3 g/dL (ref 3.8–4.8)
Alkaline Phosphatase: 94 IU/L (ref 44–121)
BUN/Creatinine Ratio: 17 (ref 9–23)
BUN: 15 mg/dL (ref 6–24)
Bilirubin Total: 0.3 mg/dL (ref 0.0–1.2)
CO2: 24 mmol/L (ref 20–29)
Calcium: 9.2 mg/dL (ref 8.7–10.2)
Chloride: 102 mmol/L (ref 96–106)
Creatinine, Ser: 0.86 mg/dL (ref 0.57–1.00)
GFR calc Af Amer: 92 mL/min/{1.73_m2} (ref >59)
GFR calc non Af Amer: 80 mL/min/{1.73_m2} (ref >59)
Globulin, Total: 2.9 g/dL (ref 1.5–4.5)
Glucose: 84 mg/dL (ref 65–99)
Potassium: 4.6 mmol/L (ref 3.5–5.2)
Sodium: 141 mmol/L (ref 134–144)
Total Protein: 7.2 g/dL (ref 6.0–8.5)

## 2020-06-17 LAB — CBC WITH DIFFERENTIAL/PLATELET
Basophils Absolute: 0 10*3/uL (ref 0.0–0.2)
Basos: 1 %
EOS (ABSOLUTE): 0.2 10*3/uL (ref 0.0–0.4)
Eos: 3 %
Hematocrit: 41.5 % (ref 34.0–46.6)
Hemoglobin: 14.6 g/dL (ref 11.1–15.9)
Immature Grans (Abs): 0 10*3/uL (ref 0.0–0.1)
Immature Granulocytes: 0 %
Lymphocytes Absolute: 1.8 10*3/uL (ref 0.7–3.1)
Lymphs: 29 %
MCH: 35.6 pg — ABNORMAL HIGH (ref 26.6–33.0)
MCHC: 35.2 g/dL (ref 31.5–35.7)
MCV: 101 fL — ABNORMAL HIGH (ref 79–97)
Monocytes Absolute: 0.5 10*3/uL (ref 0.1–0.9)
Monocytes: 8 %
Neutrophils Absolute: 3.6 10*3/uL (ref 1.4–7.0)
Neutrophils: 59 %
Platelets: 235 10*3/uL (ref 150–450)
RBC: 4.1 x10E6/uL (ref 3.77–5.28)
RDW: 11.8 % (ref 11.7–15.4)
WBC: 6.1 10*3/uL (ref 3.4–10.8)

## 2020-06-17 LAB — LIPID PANEL
Chol/HDL Ratio: 2.6 ratio (ref 0.0–4.4)
Cholesterol, Total: 148 mg/dL (ref 100–199)
HDL: 58 mg/dL (ref >39)
LDL Chol Calc (NIH): 44 mg/dL (ref 0–99)
Triglycerides: 307 mg/dL — ABNORMAL HIGH (ref 0–149)
VLDL Cholesterol Cal: 46 mg/dL — ABNORMAL HIGH (ref 5–40)

## 2020-06-17 MED ORDER — BUPROPION HCL ER (XL) 300 MG PO TB24: 300.0000 mg | ORAL_TABLET | Freq: Every day | ORAL | 1 refills | Status: DC

## 2020-06-17 MED ORDER — PANTOPRAZOLE SODIUM 40 MG PO TBEC: 40.0000 mg | DELAYED_RELEASE_TABLET | Freq: Every day | ORAL | 1 refills | Status: DC

## 2020-06-17 MED ORDER — ATORVASTATIN CALCIUM 20 MG PO TABS
20.0000 mg | ORAL_TABLET | Freq: Every day | ORAL | 1 refills | Status: DC
Start: 2020-06-17 — End: 2021-03-24

## 2020-06-17 MED ORDER — CARBAMAZEPINE ER 200 MG PO CP12: 200.0000 mg | ORAL_CAPSULE | Freq: Two times a day (BID) | ORAL | 0 refills | Status: DC

## 2020-06-17 MED ORDER — LISINOPRIL 40 MG PO TABS: 40.0000 mg | ORAL_TABLET | Freq: Every day | ORAL | 1 refills | Status: DC

## 2020-06-17 MED ORDER — BACLOFEN 10 MG PO TABS: 10.0000 mg | ORAL_TABLET | Freq: Two times a day (BID) | ORAL | 1 refills | Status: DC

## 2020-06-17 NOTE — Progress Notes (Signed)
Subjective:    Patient ID: Cheryl Reeves, female    DOB: Oct 19, 1970, 50 y.o.   MRN: [id number]   Chief Complaint: Medical Management of Chronic Issues    HPI:  1. Late effect of cerebrovascular accident (CVA) Had stroke in 1999 and has chronic right sided weakness. She does well. ususally carriess a cane with her every where she goes. denies any recent falls. SHe takes baclofen for the spasms.  2. Chronic migraine without aura without status migrainosus, not intractable Has at least 2 migrianes a week. She takes effexor dialy and she says she cannot tell any difference. She sees neurologis, but has not been in a long while. She tried ubrelvy an dit did not helps.  3. Gastroesophageal reflux disease, unspecified whether esophagitis present Is on protonix daily and that works well.  4. Foot drop, right Increase her risk of possible falls. She denies any recent falls. None sonce her last visit here.  5. Seizures (HCC) Is on carbamazepine daily. Has not had a seizure since   6. GAD She is on wellbutrin which help-s to control her anxiety. GAD 7 : Generalized Anxiety Score 06/17/2020 05/16/2019 07/05/2015  Nervous, Anxious, on Edge 3 2 1   Control/stop worrying 3 2 3   Worry too much - different things 3 3 3   Trouble relaxing 0 3 2  Restless 0 1 3  Easily annoyed or irritable 0 1 2  Afraid - awful might happen 0 3 0  Total GAD 7 Score 9 15 14   Anxiety Difficulty Not difficult at all Very difficult Very difficult      7. Primary hypertension She is on lisinopril 20mg  daily. She says that she does not take her blood pressure at home. But she can feel that it is running high. BP Readings from Last 3 Encounters:  06/17/20 (!) 156/94  03/12/20 (!) 157/104  10/26/19 (!) 135/106     Outpatient Encounter Medications as of 06/17/2020  Medication Sig  . aspirin 81 MG tablet Take 81 mg by mouth daily.  atorvastatin (LIPITOR) 20 MG tablet TAKE ONE (1) TABLET EACH DAY  .  baclofen (LIORESAL) 10 MG tablet TAKE ONE TABLET BY MOUTH TWICE DAILY  . buPROPion (WELLBUTRIN XL) 300 MG 24 hr tablet Take 1 tablet (300 mg total) by mouth daily. Needs appointment for further refills  . carbamazepine (CARBATROL) 200 MG 12 hr capsule Take 1 capsule (200 mg total) by mouth 2 (two) times daily. (Needs to be seen before next refill)  . ibuprofen (ADVIL) 800 MG tablet Take 1 tablet (800 mg total) by mouth every 8 (eight) hours as needed.  08/15/20 lisinopril (ZESTRIL) 20 MG tablet TAKE ONE (1) TABLET EACH DAY  . pantoprazole (PROTONIX) 40 MG tablet TAKE ONE (1) TABLET EACH DAY  . venlafaxine XR (EFFEXOR XR) 37.5 MG 24 hr capsule Take 1 capsule (37.5 mg total) by mouth at bedtime.     Past Surgical History:  Procedure Laterality Date  . ARTHROSCOPY KNEE W/ DRILLING  2008   rt  . REPLACEMENT TOTAL KNEE Right 2009  . SEPTOPLASTY Bilateral 07/21/2013   Procedure: SEPTOPLASTY;  Surgeon: Marland Kitchen, MD;  Location: [facility];  Service: ENT;  Laterality: Bilateral;  . SINUS ENDO W/FUSION Bilateral 07/21/2013   Procedure: ENDOSCOPIC SINUS SURGERY WITH FUSION NAVIGATION;  Surgeon: 2009, MD;  Location: [facility];  Service: ENT;  Laterality: Bilateral;  . TUBAL LIGATION  2000    Family History  Problem Relation  Age of Onset  . Cancer Mother        lung  . Arthritis Mother   . Heart disease Father   . Cancer Father        lung   . Hypertension Father   . Heart disease Brother   . Hypertension Brother   . Diabetes Other   . Arthritis/Rheumatoid Other     New complaints: Patient says her right fot has been locking up. She thinks the fact that her right knee is turning in more causes her foot to laock up.   Social history: Lives with her husband  Controlled substance contract: n/a    Review of Systems  Constitutional: Negative for diaphoresis.  Eyes: Negative for pain.  Respiratory: Negative for shortness of breath.   Cardiovascular:  Negative for chest pain, palpitations and leg swelling.  Gastrointestinal: Negative for abdominal pain.  Endocrine: Negative for polydipsia.  Skin: Negative for rash.  Neurological: Negative for dizziness, weakness and headaches.  Hematological: Does not bruise/bleed easily.  All other systems reviewed and are negative.      Objective:   Physical Exam Vitals and nursing note reviewed.  Constitutional:      General: She is not in acute distress.    Appearance: Normal appearance. She is well-developed and well-nourished.  HENT:     Head: Normocephalic.     Nose: Nose normal.     Mouth/Throat:     Mouth: Oropharynx is clear and moist.  Eyes:     Extraocular Movements: EOM normal.     Pupils: Pupils are equal, round, and reactive to light.  Neck:     Vascular: No carotid bruit or JVD.  Cardiovascular:     Rate and Rhythm: Normal rate and regular rhythm.     Pulses: Intact distal pulses.     Heart sounds: Normal heart sounds.  Pulmonary:     Effort: Pulmonary effort is normal. No respiratory distress.     Breath sounds: Normal breath sounds. No wheezing or rales.  Chest:     Chest wall: No tenderness.  Abdominal:     General: Bowel sounds are normal. There is no distension or abdominal bruit. Aorta is normal.     Palpations: Abdomen is soft. There is no hepatomegaly, splenomegaly, mass or pulsatile mass.     Tenderness: There is no abdominal tenderness.  Musculoskeletal:        General: No edema. Normal range of motion.     Cervical back: Normal range of motion and neck supple.     Comments: Internal rotation of right leg Right foot drop  Lymphadenopathy:     Cervical: No cervical adenopathy.  Skin:    General: Skin is warm and dry.  Neurological:     Mental Status: She is alert and oriented to person, place, and time.     Deep Tendon Reflexes: Reflexes are normal and symmetric.  Psychiatric:        Mood and Affect: Mood and affect normal.        Behavior: Behavior  normal.        Thought Content: Thought content normal.        Judgment: Judgment normal.     BP (!) 156/94   Pulse 71   Temp 97.8 F (36.6 C) (Temporal)   Resp 20   Ht 5\' 5"  (1.651 m)   Wt 154 lb (69.9 kg)   SpO2 96%   BMI 25.63 kg/m        Assessment &  Plan:  ADRYANNA FRIEDT comes in today with chief complaint of Medical Management of Chronic Issues   Diagnosis and orders addressed:  1. Late effect of cerebrovascular accident (CVA) Fall precautions - baclofen (LIORESAL) 10 MG tablet; Take 1 tablet (10 mg total) by mouth 2 (two) times daily.  Dispense: 180 each; Refill: 1  2. Chronic migraine without aura without status migrainosus, not intractable Keep diary of migraines. Need to follow up up with neurology  3. Gastroesophageal reflux disease, unspecified whether esophagitis present Avoid spicy foods Do not eat 2 hours prior to bedtime - pantoprazole (PROTONIX) 40 MG tablet; Take 1 tablet (40 mg total) by mouth daily.  Dispense: 90 tablet; Refill: 1  4. Foot drop, right Use cane when walking to help prevent falls - Ambulatory referral to Orthopedic Surgery  5. Seizures (HCC) - carbamazepine (CARBATROL) 200 MG 12 hr capsule; Take 1 capsule (200 mg total) by mouth 2 (two) times daily. (Needs to be seen before next refill)  Dispense: 60 capsule; Refill: 0  6. GAD (generalized anxiety disorder) Stress management - buPROPion (WELLBUTRIN XL) 300 MG 24 hr tablet; Take 1 tablet (300 mg total) by mouth daily. Needs appointment for further refills  Dispense: 90 tablet; Refill: 1  7. Primary hypertension Increase lisinopril to 40mg  daily Keep diary of blood pressure if possible - lisinopril (ZESTRIL) 40 MG tablet; Take 1 tablet (40 mg total) by mouth daily.  Dispense: 90 tablet; Refill: 1  8. Pure hypercholesterolemia Low fat diet - atorvastatin (LIPITOR) 20 MG tablet; Take 1 tablet (20 mg total) by mouth daily.  Dispense: 90 tablet; Refill: 1   Labs  pending Health Maintenance reviewed Diet and exercise encouraged  Follow up plan: 3 months   Mary-Margaret , FNP

## 2020-06-17 NOTE — Patient Instructions (Signed)
Fall Prevention in the Home, Adult Falls can cause injuries and can happen to people of all ages. There are many things you can do to make your home safe and to help prevent falls. Ask for help when making these changes. What actions can I take to prevent falls? General Instructions  Use good lighting in all rooms. Replace any light bulbs that burn out.  Turn on the lights in dark areas. Use night-lights.  Keep items that you use often in easy-to-reach places. Lower the shelves around your home if needed.  Set up your furniture so you have a clear path. Avoid moving your furniture around.  Do not have throw rugs or other things on the floor that can make you trip.  Avoid walking on wet floors.  If any of your floors are uneven, fix them.  Add color or contrast paint or tape to clearly mark and help you see: ? Grab bars or handrails. ? First and last steps of staircases. ? Where the edge of each step is.  If you use a stepladder: ? Make sure that it is fully opened. Do not climb a closed stepladder. ? Make sure the sides of the stepladder are locked in place. ? Ask someone to hold the stepladder while you use it.  Know where your pets are when moving through your home. What can I do in the bathroom?  Keep the floor dry. Clean up any water on the floor right away.  Remove soap buildup in the tub or shower.  Use nonskid mats or decals on the floor of the tub or shower.  Attach bath mats securely with double-sided, nonslip rug tape.  If you need to sit down in the shower, use a plastic, nonslip stool.  Install grab bars by the toilet and in the tub and shower. Do not use towel bars as grab bars.      What can I do in the bedroom?  Make sure that you have a light by your bed that is easy to reach.  Do not use any sheets or blankets for your bed that hang to the floor.  Have a firm chair with side arms that you can use for support when you get dressed. What can I do in  the kitchen?  Clean up any spills right away.  If you need to reach something above you, use a step stool with a grab bar.  Keep electrical cords out of the way.  Do not use floor polish or wax that makes floors slippery. What can I do with my stairs?  Do not leave any items on the stairs.  Make sure that you have a light switch at the top and the bottom of the stairs.  Make sure that there are handrails on both sides of the stairs. Fix handrails that are broken or loose.  Install nonslip stair treads on all your stairs.  Avoid having throw rugs at the top or bottom of the stairs.  Choose a carpet that does not hide the edge of the steps on the stairs.  Check carpeting to make sure that it is firmly attached to the stairs. Fix carpet that is loose or worn. What can I do on the outside of my home?  Use bright outdoor lighting.  Fix the edges of walkways and driveways and fix any cracks.  Remove anything that might make you trip as you walk through a door, such as a raised step or threshold.  Trim any   bushes or trees on paths to your home.  Check to see if handrails are loose or broken and that both sides of all steps have handrails.  Install guardrails along the edges of any raised decks and porches.  Clear paths of anything that can make you trip, such as tools or rocks.  Have leaves, snow, or ice cleared regularly.  Use sand or salt on paths during winter.  Clean up any spills in your garage right away. This includes grease or oil spills. What other actions can I take?  Wear shoes that: ? Have a low heel. Do not wear high heels. ? Have rubber bottoms. ? Feel good on your feet and fit well. ? Are closed at the toe. Do not wear open-toe sandals.  Use tools that help you move around if needed. These include: ? Canes. ? Walkers. ? Scooters. ? Crutches.  Review your medicines with your doctor. Some medicines can make you feel dizzy. This can increase your chance  of falling. Ask your doctor what else you can do to help prevent falls. Where to find more information  Centers for Disease Control and Prevention, [employer]: www.cdc.gov  National Institute on Aging: www.nia.nih.gov Contact a doctor if:  You are afraid of falling at home.  You feel weak, drowsy, or dizzy at home.  You fall at home. Summary  There are many simple things that you can do to make your home safe and to help prevent falls.  Ways to make your home safe include removing things that can make you trip and installing grab bars in the bathroom.  Ask for help when making these changes in your home. This information is not intended to replace advice given to you by your health care provider. Make sure you discuss any questions you have with your health care provider. Document Revised: 12/27/2019 Document Reviewed: 12/27/2019 Elsevier Patient Education  2021 Elsevier Inc.  

## 2020-06-26 ENCOUNTER — Other Ambulatory Visit: Payer: Self-pay | Admitting: Nurse Practitioner

## 2020-06-26 DIAGNOSIS — Z1231 Encounter for screening mammogram for malignant neoplasm of breast: Secondary | ICD-10-CM

## 2020-07-29 ENCOUNTER — Telehealth (INDEPENDENT_AMBULATORY_CARE_PROVIDER_SITE_OTHER): Payer: PPO | Admitting: Family Medicine

## 2020-07-29 ENCOUNTER — Encounter: Payer: Self-pay | Admitting: Family Medicine

## 2020-07-29 DIAGNOSIS — J0101 Acute recurrent maxillary sinusitis: Secondary | ICD-10-CM | POA: Diagnosis not present

## 2020-07-29 MED ORDER — PREDNISONE 20 MG PO TABS: ORAL_TABLET | ORAL | 0 refills | Status: DC

## 2020-07-29 MED ORDER — FLUTICASONE PROPIONATE 50 MCG/ACT NA SUSP: 1.0000 | Freq: Two times a day (BID) | NASAL | 6 refills | Status: DC | PRN

## 2020-07-29 MED ORDER — AMOXICILLIN-POT CLAVULANATE 875-125 MG PO TABS: 1.0000 | ORAL_TABLET | Freq: Two times a day (BID) | ORAL | 0 refills | Status: DC

## 2020-07-29 MED ORDER — FLUCONAZOLE 150 MG PO TABS: 150.0000 mg | ORAL_TABLET | Freq: Once | ORAL | 0 refills | Status: AC

## 2020-07-29 NOTE — Progress Notes (Signed)
Virtual Visit via mychart video Note  I connected with Cheryl Reeves on 07/29/20 at 1727 by video and verified that I am speaking with the correct person using two identifiers. Cheryl Reeves is currently located at home and patient are currently with her during visit. The provider, Elige Radon Latoyia Tecson, MD is located in their office at time of visit.  Call ended at 1738  I discussed the limitations, risks, security and privacy concerns of performing an evaluation and management service by video and the availability of in person appointments. I also discussed with the patient that there may be a patient responsible charge related to this service. The patient expressed understanding and agreed to proceed.   History and Present Illness: Patient is calling in for cough and sinus congestion.  She called a couple weeks ago and had amoxicillin.  She has continued to have sinus drainage and sore throat and congestion.  She denies fevers or chills.  She denies ear drainage but has pressure.  She denies sick contacts.  She denies sick covid contacts. She has ENT.   She is using nasal spray and mucinex and they are not helping  No diagnosis found.  Outpatient Encounter Medications as of 07/29/2020  Medication Sig  . aspirin 81 MG tablet Take 81 mg by mouth daily.  Marland Kitchen atorvastatin (LIPITOR) 20 MG tablet Take 1 tablet (20 mg total) by mouth daily.  . baclofen (LIORESAL) 10 MG tablet Take 1 tablet (10 mg total) by mouth 2 (two) times daily.  Marland Kitchen buPROPion (WELLBUTRIN XL) 300 MG 24 hr tablet Take 1 tablet (300 mg total) by mouth daily. Needs appointment for further refills  . carbamazepine (CARBATROL) 200 MG 12 hr capsule Take 1 capsule (200 mg total) by mouth 2 (two) times daily. (Needs to be seen before next refill)  . ibuprofen (ADVIL) 800 MG tablet Take 1 tablet (800 mg total) by mouth every 8 (eight) hours as needed.  Marland Kitchen lisinopril (ZESTRIL) 40 MG tablet Take 1 tablet (40 mg total) by mouth daily.   . pantoprazole (PROTONIX) 40 MG tablet Take 1 tablet (40 mg total) by mouth daily.  Marland Kitchen venlafaxine XR (EFFEXOR XR) 37.5 MG 24 hr capsule Take 1 capsule (37.5 mg total) by mouth at bedtime.   No facility-administered encounter medications on file as of 07/29/2020.    Review of Systems  Constitutional: Negative for chills and fever.  HENT: Positive for congestion, postnasal drip, rhinorrhea, sinus pressure, sneezing and sore throat. Negative for ear discharge and ear pain.   Eyes: Negative for pain, redness and visual disturbance.  Respiratory: Positive for cough. Negative for chest tightness and shortness of breath.   Cardiovascular: Negative for chest pain and leg swelling.  Genitourinary: Negative for difficulty urinating and dysuria.  Musculoskeletal: Negative for back pain and gait problem.  Skin: Negative for rash.  Neurological: Negative for light-headedness and headaches.  Psychiatric/Behavioral: Negative for agitation and behavioral problems.  All other systems reviewed and are negative.   Observations/Objective: Patient sounds comfortable and in no acute distress  Assessment and Plan: Problem List Items Addressed This Visit   None   Visit Diagnoses    Acute recurrent maxillary sinusitis    -  Primary   Relevant Medications   predniSONE (DELTASONE) 20 MG tablet   fluticasone (FLONASE) 50 MCG/ACT nasal spray   amoxicillin-clavulanate (AUGMENTIN) 875-125 MG tablet   fluconazole (DIFLUCAN) 150 MG tablet       Follow up plan: Return if symptoms worsen or  fail to improve.     I discussed the assessment and treatment plan with the patient. The patient was provided an opportunity to ask questions and all were answered. The patient agreed with the plan and demonstrated an understanding of the instructions.   The patient was advised to call back or seek an in-person evaluation if the symptoms worsen or if the condition fails to improve as anticipated.  The above assessment  and management plan was discussed with the patient. The patient verbalized understanding of and has agreed to the management plan. Patient is aware to call the clinic if symptoms persist or worsen. Patient is aware when to return to the clinic for a follow-up visit. Patient educated on when it is appropriate to go to the emergency department.    I provided 11 minutes of non-face-to-face time during this encounter.    Nils Pyle, MD

## 2020-08-08 ENCOUNTER — Other Ambulatory Visit: Payer: Self-pay | Admitting: Nurse Practitioner

## 2020-08-08 DIAGNOSIS — R569 Unspecified convulsions: Secondary | ICD-10-CM

## 2020-08-13 ENCOUNTER — Ambulatory Visit (INDEPENDENT_AMBULATORY_CARE_PROVIDER_SITE_OTHER): Payer: PPO | Admitting: *Deleted

## 2020-08-13 DIAGNOSIS — Z0000 Encounter for general adult medical examination without abnormal findings: Secondary | ICD-10-CM | POA: Diagnosis not present

## 2020-08-13 NOTE — Progress Notes (Signed)
MEDICARE ANNUAL WELLNESS VISIT  08/13/2020  Telephone Visit Disclaimer This Medicare AWV was conducted by telephone due to national recommendations for restrictions regarding the COVID-19 Pandemic (e.g. social distancing).  I verified, using two identifiers, that I am speaking with Cheryl Reeves or their authorized healthcare agent. I discussed the limitations, risks, security, and privacy concerns of performing an evaluation and management service by telephone and the potential availability of an in-person appointment in the future. The patient expressed understanding and agreed to proceed.  Location of Patient: home Location of Provider (nurse):  office  Subjective:    Cheryl Reeves is a 50 y.o. female patient of Bennie Pierini, FNP who had a Medicare Annual Wellness Visit today via telephone. Cheryl Reeves is Disabled and lives with their spouse. she has 1 biological child and 1 stepson. she reports that she is socially active and does interact with friends/family regularly. she is minimally physically active and enjoys watching TV, spending time with family and playing with her dog.  Patient Care Team: Bennie Pierini, FNP as PCP - General (Family Medicine) Levert Feinstein, MD as Consulting Physician (Neurology) Gwenith Daily, RN as Registered Nurse  Advanced Directives 08/13/2020 02/24/2018 10/29/2017 09/10/2017 09/05/2017 07/19/2013  Does Patient Have a Medical Advance Directive? No No No No No Patient does not have advance directive;Patient would not like information  Would patient like information on creating a medical advance directive? No - Patient declined Yes (MAU/Ambulatory/Procedural Areas - Information given) - No - Patient declined No - Patient declined -    Hospital Utilization Over the Past 12 Months: # of hospitalizations or ER visits: 1 # of surgeries: 0  Review of Systems    Patient reports that her overall health is worse compared to last year.  History  obtained from chart review and the patient  Patient Reported Readings (BP, Pulse, CBG, Weight, etc) none  Pain Assessment Pain : 0-10 Pain Score: 4  Pain Type: Chronic pain Pain Location: Back Pain Descriptors / Indicators: Constant Pain Onset: More than a month ago Pain Frequency: Constant Pain Relieving Factors: Salon Pas, ibuprofen, Effect of Pain on Daily Activities: moderate  Pain Relieving Factors: Salon Pas, ibuprofen,  Current Medications & Allergies (verified) Allergies as of 08/13/2020      Reactions   Codeine    headaches      Medication List       Accurate as of August 13, 2020  2:24 PM. If you have any questions, ask your nurse or doctor.        STOP taking these medications   amoxicillin-clavulanate 875-125 MG tablet Commonly known as: AUGMENTIN   predniSONE 20 MG tablet Commonly known as: DELTASONE     TAKE these medications   aspirin 81 MG tablet Take 81 mg by mouth daily.   atorvastatin 20 MG tablet Commonly known as: LIPITOR Take 1 tablet (20 mg total) by mouth daily.   baclofen 10 MG tablet Commonly known as: LIORESAL Take 1 tablet (10 mg total) by mouth 2 (two) times daily.   buPROPion 300 MG 24 hr tablet Commonly known as: WELLBUTRIN XL Take 1 tablet (300 mg total) by mouth daily. Needs appointment for further refills   carbamazepine 200 MG 12 hr capsule Commonly known as: CARBATROL TAKE ONE CAPSULE BY MOUTH TWICE A DAY   fluticasone 50 MCG/ACT nasal spray Commonly known as: FLONASE Place 1 spray into both nostrils 2 (two) times daily as needed for allergies or rhinitis.   ibuprofen  200 MG tablet Commonly known as: ADVIL Take 800 mg by mouth every 6 (six) hours as needed. What changed: Another medication with the same name was removed. Continue taking this medication, and follow the directions you see here.   lisinopril 40 MG tablet Commonly known as: ZESTRIL Take 1 tablet (40 mg total) by mouth daily.   pantoprazole 40 MG  tablet Commonly known as: PROTONIX Take 1 tablet (40 mg total) by mouth daily.   venlafaxine XR 37.5 MG 24 hr capsule Commonly known as: Effexor XR Take 1 capsule (37.5 mg total) by mouth at bedtime.       History (reviewed): Past Medical History:  Diagnosis Date  . Allergy   . DJD (degenerative joint disease)   . GERD (gastroesophageal reflux disease)   . Headache   . Stroke [facility]) 1999   S/p childbirth-weakness rt leg  . Thoracic injuries   . Wears glasses    Past Surgical History:  Procedure Laterality Date  . ARTHROSCOPY KNEE W/ DRILLING  2008   rt  . REPLACEMENT TOTAL KNEE Right 2009  . SEPTOPLASTY Bilateral 07/21/2013   Procedure: SEPTOPLASTY;  Surgeon: Suzanna Obey, MD;  Location: [facility];  Service: ENT;  Laterality: Bilateral;  . SINUS ENDO W/FUSION Bilateral 07/21/2013   Procedure: ENDOSCOPIC SINUS SURGERY WITH FUSION NAVIGATION;  Surgeon: Suzanna Obey, MD;  Location: [facility];  Service: ENT;  Laterality: Bilateral;  . TUBAL LIGATION  2000   Family History  Problem Relation Age of Onset  . Cancer Mother        lung  . Arthritis Mother   . Heart disease Father   . Cancer Father        lung   . Hypertension Father   . Heart disease Brother   . Hypertension Brother   . Diabetes Other   . Arthritis/Rheumatoid Other    Social History   Socioeconomic History  . Marital status: Married    Spouse name: Zella Ball  . Number of children: 2  . Years of education: 10  . Highest education level: 10th grade  Occupational History  . Occupation: Disabled  Tobacco Use  . Smoking status: Current Every Day Smoker    Packs/day: 0.50    Years: 30.00    Pack years: 15.00    Types: Cigarettes  . Smokeless tobacco: Never Used  Vaping Use  . Vaping Use: Never used  Substance and Sexual Activity  . Alcohol use: Yes    Alcohol/week: 4.0 standard drinks    Types: 4 Cans of beer per week    Comment: occ  . Drug use: Yes    Types: Marijuana     Comment: occasionally  . Sexual activity: Yes  Other Topics Concern  . Not on file  Social History Narrative   Lives at home with husband and son.   Married, 1 son and 1 stepson    Right handed   10 th grade   3-4 cups caffeine per day.   Social Determinants of Health   Financial Resource Strain: Not on file  Food Insecurity: Not on file  Transportation Needs: Not on file  Physical Activity: Not on file  Stress: Not on file  Social Connections: Not on file    Activities of Daily Living In your present state of health, do you have any difficulty performing the following activities: 08/13/2020 03/12/2020  Hearing? Y N  Comment she has to have people repeat themselves frequently -  Vision? N  N  Comment wears rx glasses all the time -  Difficulty concentrating or making decisions? N N  Walking or climbing stairs? Y Y  Comment due to her drop foot Walks with cane  Dressing or bathing? N N  Doing errands, shopping? N N  Preparing Food and eating ? N -  Using the Toilet? N -  In the past six months, have you accidently leaked urine? Y -  Comment dribbles every once in a while -  Do you have problems with loss of bowel control? N -  Managing your Medications? N -  Managing your Finances? N -  Housekeeping or managing your Housekeeping? N -  Some recent data might be hidden    Patient Education/ Literacy How often do you need to have someone help you when you read instructions, pamphlets, or other written materials from your doctor or pharmacy?: 1 - Never What is the last grade level you completed in school?: 10th grade  Exercise Current Exercise Habits: The patient does not participate in regular exercise at present, Exercise limited by: orthopedic condition(s);neurologic condition(s)  Diet Patient reports consuming 2 meals a day and 1 snack(s) a day Patient reports that her primary diet is: Regular Patient reports that she does have regular access to food.    Depression Screen PHQ 2/9 Scores 08/13/2020 06/17/2020 03/12/2020 02/27/2020 05/16/2019 01/24/2019 06/23/2018  PHQ - 2 Score 0 1 0 1 4 0 1  PHQ- 9 Score - 4 0 - 10 - -  Exception Documentation - - - - - - -  Not completed - - - - - - -     Fall Risk Fall Risk  08/13/2020 06/17/2020 03/12/2020 05/16/2019 01/24/2019  Falls in the past year? 1 0 1 0 0  Number falls in past yr: 1 - 1 - -  Injury with Fall? 1 - 0 - -  Risk Factor Category  - - - - -  Risk for fall due to : History of fall(s) - History of fall(s) - -  Follow up Falls evaluation completed - Education provided - -     Objective:  Cheryl Reeves seemed alert and oriented and she participated appropriately during our telephone visit.  Blood Pressure Weight BMI  BP Readings from Last 3 Encounters:  06/17/20 (!) 156/94  03/12/20 (!) 157/104  10/26/19 (!) 135/106   Wt Readings from Last 3 Encounters:  06/17/20 154 lb (69.9 kg)  03/12/20 151 lb (68.5 kg)  10/26/19 160 lb (72.6 kg)   BMI Readings from Last 1 Encounters:  06/17/20 25.63 kg/m    *Unable to obtain current vital signs, weight, and BMI due to telephone visit type  Hearing/Vision  . Jeany did not seem to have difficulty with hearing/understanding during the telephone conversation . Reports that she has not had a formal eye exam by an eye care professional within the past year . Reports that she has not had a formal hearing evaluation within the past year *Unable to fully assess hearing and vision during telephone visit type  Cognitive Function: 6CIT Screen 08/13/2020  What Year? 0 points  What month? 0 points  What time? 0 points  Count back from 20 0 points  Months in reverse 0 points  Repeat phrase 0 points  Total Score 0   (Normal:0-7, Significant for Dysfunction: >8)  Normal Cognitive Function Screening: Yes   Immunization & Health Maintenance Record Immunization History  Administered Date(s) Administered  . Influenza Split 03/15/2013  . Tdap  09/01/2017    Health Maintenance  Topic Date Due  . COVID-19 Vaccine (1) Never done  . MAMMOGRAM  10/16/2017  . INFLUENZA VACCINE  09/05/2020 (Originally 01/07/2020)  . COLONOSCOPY (Pts [id number] Insurance coverage will need to be confirmed)  06/17/2021 (Originally 10/05/2015)  . HIV Screening  06/17/2021 (Originally 10/04/1985)  . PAP SMEAR-Modifier  02/28/2021  . TETANUS/TDAP  09/02/2027  . Hepatitis C Screening  Completed  . HPV VACCINES  Aged Out       Assessment  This is a routine wellness examination for Cheryl Teresa [place].  Health Maintenance: Due or Overdue Health Maintenance Due  Topic Date Due  . COVID-19 Vaccine (1) Never done  . MAMMOGRAM  10/16/2017    Cheryl Teresa [place] does not need a referral for Community Assistance: Care Management:   no Social Work:    no Prescription Assistance:  no Nutrition/Diabetes Education:  no   Plan:  Personalized Goals Goals Addressed            This Visit's Progress   . DIET - INCREASE WATER INTAKE        Personalized Health Maintenance & Screening Recommendations  COVID vaccines  Lung Cancer Screening Recommended: no (Low Dose CT Chest recommended if Age 68-80 years, 30 pack-year currently smoking OR have quit w/in past 15 years) Hepatitis C Screening recommended: no HIV Screening recommended: no  Advanced Directives: Written information was not prepared per patient's request.  Referrals & Orders No orders of the defined types were placed in this encounter.   Follow-up Plan . Follow-up with Bennie PieriniMartin, Mary-Margaret, FNP as planned . Consider COVID vaccines    I have personally reviewed and noted the following in the patient's chart:   . Medical and social history . Use of alcohol, tobacco or illicit drugs  . Current medications and supplements . Functional ability and status . Nutritional status . Physical activity . Advanced directives . List of other physicians . Hospitalizations, surgeries, and ER visits  in previous 12 months . Vitals . Screenings to include cognitive, depression, and falls . Referrals and appointments  In addition, I have reviewed and discussed with Cheryl Teresa [place] certain preventive protocols, quality metrics, and best practice recommendations. A written personalized care plan for preventive services as well as general preventive health recommendations is available and can be mailed to the patient at her request.      Hessie DienerSouthern, Karla G, LPN  9/6/04543/01/2021

## 2020-08-13 NOTE — Patient Instructions (Signed)
Preventive Care 84-50 Years Old, Female Preventive care refers to lifestyle choices and visits with your health care provider that can promote health and wellness. This includes:  A yearly physical exam. This is also called an annual wellness visit.  Regular dental and eye exams.  Immunizations.  Screening for certain conditions.  Healthy lifestyle choices, such as: ? Eating a healthy diet. ? Getting regular exercise. ? Not using drugs or products that contain nicotine and tobacco. ? Limiting alcohol use. What can I expect for my preventive care visit? Physical exam Your health care provider will check your:  Height and weight. These may be used to calculate your BMI (body mass index). BMI is a measurement that tells if you are at a healthy weight.  Heart rate and blood pressure.  Body temperature.  Skin for abnormal spots. Counseling Your health care provider may ask you questions about your:  Past medical problems.  Family's medical history.  Alcohol, tobacco, and drug use.  Emotional well-being.  Home life and relationship well-being.  Sexual activity.  Diet, exercise, and sleep habits.  Work and work Statistician.  Access to firearms.  Method of birth control.  Menstrual cycle.  Pregnancy history. What immunizations do I need? Vaccines are usually given at various ages, according to a schedule. Your health care provider will recommend vaccines for you based on your age, medical history, and lifestyle or other factors, such as travel or where you work.   What tests do I need? Blood tests  Lipid and cholesterol levels. These may be checked every 5 years, or more often if you are over 3 years old.  Hepatitis C test.  Hepatitis B test. Screening  Lung cancer screening. You may have this screening every year starting at age 73 if you have a 30-pack-year history of smoking and currently smoke or have quit within the past 15 years.  Colorectal cancer  screening. ? All adults should have this screening starting at age 52 and continuing until age 17. ? Your health care provider may recommend screening at age 49 if you are at increased risk. ? You will have tests every 1-10 years, depending on your results and the type of screening test.  Diabetes screening. ? This is done by checking your blood sugar (glucose) after you have not eaten for a while (fasting). ? You may have this done every 1-3 years.  Mammogram. ? This may be done every 1-2 years. ? Talk with your health care provider about when you should start having regular mammograms. This may depend on whether you have a family history of breast cancer.  BRCA-related cancer screening. This may be done if you have a family history of breast, ovarian, tubal, or peritoneal cancers.  Pelvic exam and Pap test. ? This may be done every 3 years starting at age 10. ? Starting at age 11, this may be done every 5 years if you have a Pap test in combination with an HPV test. Other tests  STD (sexually transmitted disease) testing, if you are at risk.  Bone density scan. This is done to screen for osteoporosis. You may have this scan if you are at high risk for osteoporosis. Talk with your health care provider about your test results, treatment options, and if necessary, the need for more tests. Follow these instructions at home: Eating and drinking  Eat a diet that includes fresh fruits and vegetables, whole grains, lean protein, and low-fat dairy products.  Take vitamin and mineral supplements  as recommended by your health care provider.  Do not drink alcohol if: ? Your health care provider tells you not to drink. ? You are pregnant, may be pregnant, or are planning to become pregnant.  If you drink alcohol: ? Limit how much you have to 0-1 drink a day. ? Be aware of how much alcohol is in your drink. In the [place] one drink equals one 12 oz bottle of beer (355 mL), one 5 oz glass of  wine (148 mL), or one 1 oz glass of hard liquor (44 mL).   Lifestyle  Take daily care of your teeth and gums. Brush your teeth every morning and night with fluoride toothpaste. Floss one time each day.  Stay active. Exercise for at least 30 minutes 5 or more days each week.  Do not use any products that contain nicotine or tobacco, such as cigarettes, e-cigarettes, and chewing tobacco. If you need help quitting, ask your health care provider.  Do not use drugs.  If you are sexually active, practice safe sex. Use a condom or other form of protection to prevent STIs (sexually transmitted infections).  If you do not wish to become pregnant, use a form of birth control. If you plan to become pregnant, see your health care provider for a prepregnancy visit.  If told by your health care provider, take low-dose aspirin daily starting at age 50.  Find healthy ways to cope with stress, such as: ? Meditation, yoga, or listening to music. ? Journaling. ? Talking to a trusted person. ? Spending time with friends and family. Safety  Always wear your seat belt while driving or riding in a vehicle.  Do not drive: ? If you have been drinking alcohol. Do not ride with someone who has been drinking. ? When you are tired or distracted. ? While texting.  Wear a helmet and other protective equipment during sports activities.  If you have firearms in your house, make sure you follow all gun safety procedures. What's next?  Visit your health care provider once a year for an annual wellness visit.  Ask your health care provider how often you should have your eyes and teeth checked.  Stay up to date on all vaccines. This information is not intended to replace advice given to you by your health care provider. Make sure you discuss any questions you have with your health care provider. Document Revised: 02/27/2020 Document Reviewed: 02/03/2018 Elsevier Patient Education  2021 [employer] Inc[employer]  

## 2020-08-26 ENCOUNTER — Telehealth: Payer: Self-pay | Admitting: Orthopedic Surgery

## 2020-08-26 NOTE — Telephone Encounter (Signed)
I called back to patient per voice message left regarding authorization form which patient had mailed to [place]; patient states she initially spoke with Toniann Fail. I had called patient when form came in mail, due to form requiring the provider to whom to send the request, as well as address, phone, and fax number, and held form for 2 weeks, with no response to message(s) left. Form no longer appears to be on file. Patient aware she had not filled out form correctly; aware we will send her another one.

## 2020-09-16 ENCOUNTER — Ambulatory Visit (INDEPENDENT_AMBULATORY_CARE_PROVIDER_SITE_OTHER): Payer: PPO | Admitting: Nurse Practitioner

## 2020-09-16 ENCOUNTER — Encounter: Payer: Self-pay | Admitting: Nurse Practitioner

## 2020-09-16 ENCOUNTER — Other Ambulatory Visit (HOSPITAL_COMMUNITY)
Admission: RE | Admit: 2020-09-16 | Discharge: 2020-09-16 | Disposition: A | Discharge status: Home / Self Care | Payer: PPO | Source: Ambulatory Visit | Attending: Nurse Practitioner | Admitting: Nurse Practitioner

## 2020-09-16 ENCOUNTER — Other Ambulatory Visit: Payer: Self-pay

## 2020-09-16 VITALS — BP 157/104 | HR 75 | Temp 97.7°F | Resp 20 | Ht 65.0 in | Wt 152.0 lb

## 2020-09-16 DIAGNOSIS — Z01419 Encounter for gynecological examination (general) (routine) without abnormal findings: Secondary | ICD-10-CM | POA: Diagnosis not present

## 2020-09-16 DIAGNOSIS — R569 Unspecified convulsions: Secondary | ICD-10-CM

## 2020-09-16 DIAGNOSIS — Z0000 Encounter for general adult medical examination without abnormal findings: Secondary | ICD-10-CM | POA: Insufficient documentation

## 2020-09-16 DIAGNOSIS — Z0001 Encounter for general adult medical examination with abnormal findings: Secondary | ICD-10-CM | POA: Diagnosis not present

## 2020-09-16 DIAGNOSIS — F411 Generalized anxiety disorder: Secondary | ICD-10-CM

## 2020-09-16 DIAGNOSIS — M21371 Foot drop, right foot: Secondary | ICD-10-CM

## 2020-09-16 DIAGNOSIS — Z1211 Encounter for screening for malignant neoplasm of colon: Secondary | ICD-10-CM

## 2020-09-16 DIAGNOSIS — I6930 Unspecified sequelae of cerebral infarction: Secondary | ICD-10-CM | POA: Diagnosis not present

## 2020-09-16 DIAGNOSIS — K219 Gastro-esophageal reflux disease without esophagitis: Secondary | ICD-10-CM | POA: Diagnosis not present

## 2020-09-16 LAB — URINALYSIS, COMPLETE
Bilirubin, UA: NEGATIVE
Glucose, UA: NEGATIVE
Ketones, UA: NEGATIVE
Leukocytes,UA: NEGATIVE
Nitrite, UA: NEGATIVE
Protein,UA: NEGATIVE
Specific Gravity, UA: 1.01 (ref 1.005–1.030)
Urobilinogen, Ur: 0.2 mg/dL (ref 0.2–1.0)
pH, UA: 6 (ref 5.0–7.5)

## 2020-09-16 LAB — MICROSCOPIC EXAMINATION

## 2020-09-16 MED ORDER — BUPROPION HCL ER (XL) 300 MG PO TB24: 300.0000 mg | ORAL_TABLET | Freq: Every day | ORAL | 1 refills | Status: DC

## 2020-09-16 MED ORDER — VENLAFAXINE HCL ER 37.5 MG PO CP24: 37.5000 mg | ORAL_CAPSULE | Freq: Every day | ORAL | 11 refills | Status: DC

## 2020-09-16 NOTE — Progress Notes (Signed)
Subjective:    Patient ID: Cheryl Reeves, female    DOB: [birth year], 50 y.o.   MRN: [id number]   Chief Complaint: Annual Exam    HPI:  1. Annual physical exam Last pap was abnormal with HPV. She was suppose to have repeated it 2 years ago.  2. Late effect of cerebrovascular accident (CVA) Has right sided weakness with limp. Uses a cane for stability when walking.  3. Seizures ([place]) No seizure in years. She is  On carbamazepine without side effetcs.  4. Foot drop, right Has foot drop and she does fall frequently  5. Gastroesophageal reflux disease, unspecified whether esophagitis present Is on protonix daily and is doing well.  6. Gad Is on effexor and wellbutrin combination. Says she has been doing well the last several months. No medication side effects. GAD 7 : Generalized Anxiety Score 09/16/2020 06/17/2020 05/16/2019 07/05/2015  Nervous, Anxious, on Edge 3 3 2 1   Control/stop worrying 3 3 2 3   Worry too much - different things 3 3 3 3   Trouble relaxing 1 0 3 2  Restless 0 0 1 3  Easily annoyed or irritable 0 0 1 2  Afraid - awful might happen 0 0 3 0  Total GAD 7 Score 10 9 15 14   Anxiety Difficulty Not difficult at all Not difficult at all Very difficult Very difficult    BP Readings from Last 3 Encounters:  09/16/20 (!) 157/104  06/17/20 (!) 156/94  03/12/20 (!) 157/104  Patient says blood pressure is in normal range at home.   Outpatient Encounter Medications as of 09/16/2020  Medication Sig  . aspirin 81 MG tablet Take 81 mg by mouth daily.  Marland Kitchen atorvastatin (LIPITOR) 20 MG tablet Take 1 tablet (20 mg total) by mouth daily.  . baclofen (LIORESAL) 10 MG tablet Take 1 tablet (10 mg total) by mouth 2 (two) times daily.  Marland Kitchen buPROPion (WELLBUTRIN XL) 300 MG 24 hr tablet Take 1 tablet (300 mg total) by mouth daily. Needs appointment for further refills  . carbamazepine (CARBATROL) 200 MG 12 hr capsule TAKE ONE CAPSULE BY MOUTH TWICE A DAY  . ibuprofen (ADVIL) 200  MG tablet Take 800 mg by mouth every 6 (six) hours as needed.  Marland Kitchen lisinopril (ZESTRIL) 40 MG tablet Take 1 tablet (40 mg total) by mouth daily.  . pantoprazole (PROTONIX) 40 MG tablet Take 1 tablet (40 mg total) by mouth daily.  Marland Kitchen venlafaxine XR (EFFEXOR XR) 37.5 MG 24 hr capsule Take 1 capsule (37.5 mg total) by mouth at bedtime.  . fluticasone (FLONASE) 50 MCG/ACT nasal spray Place 1 spray into both nostrils 2 (two) times daily as needed for allergies or rhinitis. (Patient not taking: Reported on 09/16/2020)   No facility-administered encounter medications on file as of 09/16/2020.    Past Surgical History:  Procedure Laterality Date  . ARTHROSCOPY KNEE W/ DRILLING  2008   rt  . REPLACEMENT TOTAL KNEE Right 2009  . SEPTOPLASTY Bilateral 07/21/2013   Procedure: SEPTOPLASTY;  Surgeon: Melissa Montane, MD;  Location: [facility];  Service: ENT;  Laterality: Bilateral;  . SINUS ENDO W/FUSION Bilateral 07/21/2013   Procedure: ENDOSCOPIC SINUS SURGERY WITH FUSION NAVIGATION;  Surgeon: Melissa Montane, MD;  Location: [facility];  Service: ENT;  Laterality: Bilateral;  . TUBAL LIGATION  2000    Family History  Problem Relation Age of Onset  . Cancer Mother        lung  . Arthritis Mother   .  Heart disease Father   . Cancer Father        lung   . Hypertension Father   . Heart disease Brother   . Hypertension Brother   . Diabetes Other   . Arthritis/Rheumatoid Other     New complaints: None today  Social history: Lives with her husband  Controlled substance contract: n/a    Review of Systems  Constitutional: Negative for diaphoresis.  Eyes: Negative for pain.  Respiratory: Negative for shortness of breath.   Cardiovascular: Negative for chest pain, palpitations and leg swelling.  Gastrointestinal: Negative for abdominal pain.  Endocrine: Negative for polydipsia.  Skin: Negative for rash.  Neurological: Negative for dizziness, weakness and headaches.   Hematological: Does not bruise/bleed easily.  All other systems reviewed and are negative.      Objective:   Physical Exam Vitals and nursing note reviewed.  Constitutional:      General: She is not in acute distress.    Appearance: Normal appearance. She is well-developed.  HENT:     Head: Normocephalic.     Nose: Nose normal.  Eyes:     Pupils: Pupils are equal, round, and reactive to light.  Neck:     Vascular: No carotid bruit or JVD.  Cardiovascular:     Rate and Rhythm: Normal rate and regular rhythm.     Heart sounds: Normal heart sounds.  Pulmonary:     Effort: Pulmonary effort is normal. No respiratory distress.     Breath sounds: Normal breath sounds. No wheezing or rales.  Chest:     Chest wall: No tenderness.  Abdominal:     General: Bowel sounds are normal. There is no distension or abdominal bruit.     Palpations: Abdomen is soft. There is no hepatomegaly, splenomegaly, mass or pulsatile mass.     Tenderness: There is no abdominal tenderness.  Genitourinary:    General: Normal vulva.     Rectum: Normal.     Comments: Cervix parous and pink No adnexal masses or tenderness Musculoskeletal:        General: Normal range of motion.     Cervical back: Normal range of motion and neck supple.     Comments: Right foot drop Limp on right- walks with cane Right arm weakness  Lymphadenopathy:     Cervical: No cervical adenopathy.  Skin:    General: Skin is warm and dry.  Neurological:     Mental Status: She is alert and oriented to person, place, and time.     Deep Tendon Reflexes: Reflexes are normal and symmetric.  Psychiatric:        Behavior: Behavior normal.        Thought Content: Thought content normal.        Judgment: Judgment normal.    BP (!) 157/104   Pulse 75   Temp 97.7 F (36.5 C) (Temporal)   Resp 20   Ht 5' 5"  (1.651 m)   Wt 152 lb (68.9 kg)   SpO2 99%   BMI 25.29 kg/m         Assessment & Plan:  Cheryl Reeves comes in  today with chief complaint of Annual Exam   Diagnosis and orders addressed:  1. Annual physical exam - Urinalysis, Complete - CBC with Differential/Platelet - CMP14+EGFR - Lipid panel - Thyroid Panel With TSH - Cytology - PAP  2. Late effect of cerebrovascular accident (CVA) Fall precautions  3. Seizures ([place]) Report any seizure activity  4. Foot drop,  right Continue to use cane for support  5. Gastroesophageal reflux disease, unspecified whether esophagitis present Avoid spicy foods Do not eat 2 hours prior to bedtime   6. GAD (generalized anxiety disorder) stress management - venlafaxine XR (EFFEXOR XR) 37.5 MG 24 hr capsule; Take 1 capsule (37.5 mg total) by mouth at bedtime.  Dispense: 30 capsule; Refill: 11 - buPROPion (WELLBUTRIN XL) 300 MG 24 hr tablet; Take 1 tablet (300 mg total) by mouth daily. Needs appointment for further refills  Dispense: 90 tablet; Refill: 1  7. Colon cancer screening - Cologuard Patient refused colonoscopy  Labs pending Health Maintenance reviewed Diet and exercise encouraged  Follow up plan: 6 months   Mary-Margaret Hassell Done, FNP

## 2020-09-16 NOTE — Patient Instructions (Signed)
Textbook of family medicine (9th ed., pp. 2992-4268). [place], [place]: [employer].">  Stress, Adult Stress is a normal reaction to life events. Stress is what you feel when life demands more than you are used to, or more than you think you can handle. Some stress can be useful, such as studying for a test or meeting a deadline at work. Stress that occurs too often or for too long can cause problems. It can affect your emotional health and interfere with relationships and normal daily activities. Too much stress can weaken your body's defense system (immune system) and increase your risk for physical illness. If you already have a medical problem, stress can make it worse. What are the causes? All sorts of life events can cause stress. An event that causes stress for one person may not be stressful for another person. Major life events, whether positive or negative, commonly cause stress. Examples include:  Losing a job or starting a new job.  Losing a loved one.  Moving to a new town or home.  Getting married or divorced.  Having a baby.  Getting injured or sick. Less obvious life events can also cause stress, especially if they occur day after day or in combination with each other. Examples include:  Working long hours.  Driving in traffic.  Caring for children.  Being in debt.  Being in a difficult relationship. What are the signs or symptoms? Stress can cause emotional symptoms, including:  Anxiety. This is feeling worried, afraid, on edge, overwhelmed, or out of control.  Anger, including irritation or impatience.  Depression. This is feeling sad, down, helpless, or guilty.  Trouble focusing, remembering, or making decisions. Stress can cause physical symptoms, including:  Aches and pains. These may affect your head, neck, back, stomach, or other areas of your body.  Tight muscles or a clenched jaw.  Low energy.  Trouble sleeping. Stress can cause unhealthy  behaviors, including:  Eating to feel better (overeating) or skipping meals.  Working too much or putting off tasks.  Smoking, drinking alcohol, or using drugs to feel better. How is this diagnosed? Stress is diagnosed through an assessment by your health care provider. He or she may diagnose this condition based on:  Your symptoms and any stressful life events.  Your medical history.  Tests to rule out other causes of your symptoms. Depending on your condition, your health care provider may refer you to a specialist for further evaluation. How is this treated? Stress management techniques are the recommended treatment for stress. Medicine is not typically recommended for the treatment of stress. Techniques to reduce your reaction to stressful life events include:  Stress identification. Monitor yourself for symptoms of stress and identify what causes stress for you. These skills may help you to avoid or prepare for stressful events.  Time management. Set your priorities, keep a calendar of events, and learn to say no. Taking these actions can help you avoid making too many commitments. Techniques for coping with stress include:  Rethinking the problem. Try to think realistically about stressful events rather than ignoring them or overreacting. Try to find the positives in a stressful situation rather than focusing on the negatives.  Exercise. Physical exercise can release both physical and emotional tension. The key is to find a form of exercise that you enjoy and do it regularly.  Relaxation techniques. These relax the body and mind. The key is to find one or more that you enjoy and use the techniques regularly. Examples include: ?  Meditation, deep breathing, or progressive relaxation techniques. ? Yoga or tai chi. ? Biofeedback, mindfulness techniques, or journaling. ? Listening to music, being out in nature, or participating in other hobbies.  Practicing a healthy lifestyle.  Eat a balanced diet, drink plenty of water, limit or avoid caffeine, and get plenty of sleep.  Having a strong support network. Spend time with family, friends, or other people you enjoy being around. Express your feelings and talk things over with someone you trust. Counseling or talk therapy with a mental health professional may be helpful if you are having trouble managing stress on your own.   Follow these instructions at home: Lifestyle  Avoid drugs.  Do not use any products that contain nicotine or tobacco, such as cigarettes, e-cigarettes, and chewing tobacco. If you need help quitting, ask your health care provider.  Limit alcohol intake to no more than 1 drink a day for nonpregnant women and 2 drinks a day for men. One drink equals 12 oz of beer, 5 oz of wine, or 1 oz of hard liquor  Do not use alcohol or drugs to relax.  Eat a balanced diet that includes fresh fruits and vegetables, whole grains, lean meats, fish, eggs, and beans, and low-fat dairy. Avoid processed foods and foods high in added fat, sugar, and salt.  Exercise at least 30 minutes on 5 or more days each week.  Get 7-8 hours of sleep each night.   General instructions  Practice stress management techniques as discussed with your health care provider.  Drink enough fluid to keep your urine clear or pale yellow.  Take over-the-counter and prescription medicines only as told by your health care provider.  Keep all follow-up visits as told by your health care provider. This is important.   Contact a health care provider if:  Your symptoms get worse.  You have new symptoms.  You feel overwhelmed by your problems and can no longer manage them on your own. Get help right away if:  You have thoughts of hurting yourself or others. If you ever feel like you may hurt yourself or others, or have thoughts about taking your own life, get help right away. You can go to your nearest emergency department or  call:  Your local emergency services (911 in the [place]  A suicide crisis helpline, such as the [facility] at 514-426-6568. This is open 24 hours a day. Summary  Stress is a normal reaction to life events. It can cause problems if it happens too often or for too long.  Practicing stress management techniques is the best way to treat stress.  Counseling or talk therapy with a mental health professional may be helpful if you are having trouble managing stress on your own. This information is not intended to replace advice given to you by your health care provider. Make sure you discuss any questions you have with your health care provider. Document Revised: 02/09/2020 Document Reviewed: 02/09/2020 Elsevier Patient Education  2021 [employer].

## 2020-09-17 LAB — CBC WITH DIFFERENTIAL/PLATELET
Basophils Absolute: 0 10*3/uL (ref 0.0–0.2)
Basos: 0 %
EOS (ABSOLUTE): 0.1 10*3/uL (ref 0.0–0.4)
Eos: 2 %
Hematocrit: 41.1 % (ref 34.0–46.6)
Hemoglobin: 14.1 g/dL (ref 11.1–15.9)
Immature Grans (Abs): 0 10*3/uL (ref 0.0–0.1)
Immature Granulocytes: 0 %
Lymphocytes Absolute: 1.4 10*3/uL (ref 0.7–3.1)
Lymphs: 21 %
MCH: 34.5 pg — ABNORMAL HIGH (ref 26.6–33.0)
MCHC: 34.3 g/dL (ref 31.5–35.7)
MCV: 101 fL — ABNORMAL HIGH (ref 79–97)
Monocytes Absolute: 0.5 10*3/uL (ref 0.1–0.9)
Monocytes: 7 %
Neutrophils Absolute: 4.8 10*3/uL (ref 1.4–7.0)
Neutrophils: 70 %
Platelets: 251 10*3/uL (ref 150–450)
RBC: 4.09 x10E6/uL (ref 3.77–5.28)
RDW: 12.1 % (ref 11.7–15.4)
WBC: 6.9 10*3/uL (ref 3.4–10.8)

## 2020-09-17 LAB — CMP14+EGFR
ALT: 7 IU/L (ref 0–32)
AST: 18 IU/L (ref 0–40)
Albumin/Globulin Ratio: 2 (ref 1.2–2.2)
Albumin: 4.5 g/dL (ref 3.8–4.8)
Alkaline Phosphatase: 125 IU/L — ABNORMAL HIGH (ref 44–121)
BUN/Creatinine Ratio: 14 (ref 9–23)
BUN: 12 mg/dL (ref 6–24)
Bilirubin Total: 0.6 mg/dL (ref 0.0–1.2)
CO2: 19 mmol/L — ABNORMAL LOW (ref 20–29)
Calcium: 8.4 mg/dL — ABNORMAL LOW (ref 8.7–10.2)
Chloride: 101 mmol/L (ref 96–106)
Creatinine, Ser: 0.83 mg/dL (ref 0.57–1.00)
Globulin, Total: 2.3 g/dL (ref 1.5–4.5)
Glucose: 91 mg/dL (ref 65–99)
Potassium: 4.1 mmol/L (ref 3.5–5.2)
Sodium: 138 mmol/L (ref 134–144)
Total Protein: 6.8 g/dL (ref 6.0–8.5)
eGFR: 86 mL/min/{1.73_m2} (ref >59)

## 2020-09-17 LAB — THYROID PANEL WITH TSH
Free Thyroxine Index: 1.4 (ref 1.2–4.9)
T3 Uptake Ratio: 24 % (ref 24–39)
T4, Total: 5.9 ug/dL (ref 4.5–12.0)
TSH: 2.21 u[IU]/mL (ref 0.450–4.500)

## 2020-09-17 LAB — LIPID PANEL
Chol/HDL Ratio: 2.3 ratio (ref 0.0–4.4)
Cholesterol, Total: 135 mg/dL (ref 100–199)
HDL: 59 mg/dL (ref >39)
LDL Chol Calc (NIH): 38 mg/dL (ref 0–99)
Triglycerides: 253 mg/dL — ABNORMAL HIGH (ref 0–149)
VLDL Cholesterol Cal: 38 mg/dL (ref 5–40)

## 2020-09-17 LAB — CYTOLOGY - PAP: Diagnosis: NEGATIVE

## 2020-09-19 ENCOUNTER — Ambulatory Visit (INDEPENDENT_AMBULATORY_CARE_PROVIDER_SITE_OTHER): Payer: PPO

## 2020-09-19 ENCOUNTER — Telehealth: Payer: Self-pay

## 2020-09-19 ENCOUNTER — Other Ambulatory Visit: Payer: Self-pay

## 2020-09-19 ENCOUNTER — Ambulatory Visit (INDEPENDENT_AMBULATORY_CARE_PROVIDER_SITE_OTHER): Payer: PPO | Admitting: Orthopaedic Surgery

## 2020-09-19 DIAGNOSIS — M25571 Pain in right ankle and joints of right foot: Secondary | ICD-10-CM

## 2020-09-19 DIAGNOSIS — M25561 Pain in right knee: Secondary | ICD-10-CM

## 2020-09-19 NOTE — Telephone Encounter (Signed)
Patient would like to know if you think botox will help with her muscles were they are contacted or PT?

## 2020-09-19 NOTE — Progress Notes (Signed)
Office Visit Note   Patient: Cheryl Reeves           Date of Birth: [birth year]           MRN: [id number] Visit Date: 09/19/2020              Requested by: Bennie Pierini, FNP [address] [place],  [place] [address] PCP: Bennie Pierini, FNP   Assessment & Plan: Visit Diagnoses:  1. Pain in right ankle and joints of right foot   2. Right knee pain, unspecified chronicity     Plan: Reviewed with patient she does not have any synovitis of her knee and x-rays showed good position and alignment without subsidence.  She can continue work on quad strengthening which would help ambulation we discussed smoking cessation recommendations.  Return as needed.  Follow-Up Instructions: No follow-ups on file.   Orders:  Orders Placed This Encounter  Procedures  . XR Knee 1-2 Views Right  . XR Ankle Complete Right  . XR Foot Complete Right   No orders of the defined types were placed in this encounter.     Procedures: No procedures performed   Clinical Data: No additional findings.   Subjective: Chief Complaint  Patient presents with  . Right Knee - Pain    HPI 50 year old female here with right knee pain.  She states she has some locking in her knee and has had some swelling in her knee for several weeks.  Patient also has some ankle pain.  Previous right total knee arthroplasty 2009 by Dr. Renae Fickle.  Patient been using ibuprofen over-the-counter.  Patient had a CVA 1999 and has had some foot drop symptoms.  Patient's had problems with the foot drop due to CVA.  History of seizures carpal tunnel.  She ambulates with a cane.  History of hypertension anxiety acid reflux previous CVA migraines.  +30-pack-year smoking history still smoking.  Review of Systems all other systems noncontributory other than as mentioned in HPI.   Objective: Vital Signs: There were no vitals taken for this visit.  Physical Exam Constitutional:      Appearance: She is  well-developed.  HENT:     Head: Normocephalic.     Right Ear: External ear normal.     Left Ear: External ear normal.  Eyes:     Pupils: Pupils are equal, round, and reactive to light.  Neck:     Thyroid: No thyromegaly.     Trachea: No tracheal deviation.  Cardiovascular:     Rate and Rhythm: Normal rate.  Pulmonary:     Effort: Pulmonary effort is normal.  Abdominal:     Palpations: Abdomen is soft.  Skin:    General: Skin is warm and dry.  Neurological:     Mental Status: She is alert and oriented to person, place, and time.  Psychiatric:        Behavior: Behavior normal.     Ortho Exam partial foot drop on the right secondary CVA with AFO.  No knee effusion well-healed midline incision collateral ligaments are stable.  Negative logroll the hips.  Specialty Comments:  No specialty comments available.  Imaging: No results found.   PMFS History: Patient Active Problem List   Diagnosis Date Noted  . Migraine 09/19/2019  . Carpal tunnel syndrome of left wrist 10/16/2016  . Allergic rhinitis 10/09/2013  . GERD (gastroesophageal reflux disease) 10/09/2013  . Late effect of cerebrovascular accident (CVA) 10/09/2013  . Foot drop, right 10/09/2013  .  Rosacea, acne 10/09/2013  . Seizures ([facility]) 10/09/2013   Past Medical History:  Diagnosis Date  . Allergy   . DJD (degenerative joint disease)   . GERD (gastroesophageal reflux disease)   . Headache   . Stroke [facility]) 1999   S/p childbirth-weakness rt leg  . Thoracic injuries   . Wears glasses     Family History  Problem Relation Age of Onset  . Cancer Mother        lung  . Arthritis Mother   . Heart disease Father   . Cancer Father        lung   . Hypertension Father   . Heart disease Brother   . Hypertension Brother   . Diabetes Other   . Arthritis/Rheumatoid Other     Past Surgical History:  Procedure Laterality Date  . ARTHROSCOPY KNEE W/ DRILLING  2008   rt  . REPLACEMENT TOTAL KNEE Right 2009  .  SEPTOPLASTY Bilateral 07/21/2013   Procedure: SEPTOPLASTY;  Surgeon: Suzanna Obey, MD;  Location: [facility];  Service: ENT;  Laterality: Bilateral;  . SINUS ENDO W/FUSION Bilateral 07/21/2013   Procedure: ENDOSCOPIC SINUS SURGERY WITH FUSION NAVIGATION;  Surgeon: Suzanna Obey, MD;  Location: [facility];  Service: ENT;  Laterality: Bilateral;  . TUBAL LIGATION  2000   Social History   Occupational History  . Occupation: Disabled  Tobacco Use  . Smoking status: Current Every Day Smoker    Packs/day: 0.50    Years: 30.00    Pack years: 15.00    Types: Cigarettes  . Smokeless tobacco: Never Used  Vaping Use  . Vaping Use: Never used  Substance and Sexual Activity  . Alcohol use: Yes    Alcohol/week: 4.0 standard drinks    Types: 4 Cans of beer per week    Comment: occ  . Drug use: Yes    Types: Marijuana    Comment: occasionally  . Sexual activity: Yes

## 2020-09-23 NOTE — Telephone Encounter (Signed)
Patient states ortho doctor told her that [facility] does to botox for muscles but she is not sure where so she would like a referral to do that. Patient also states that her whole leg is swollen do you thinks she needs to be checked for anything else?

## 2020-09-23 NOTE — Telephone Encounter (Signed)
I have no idea. Where would she like to be referred for consultation.

## 2020-09-24 NOTE — Telephone Encounter (Signed)
Where exactly is the swelling. I will hav eto [facility]gate the botox injections, because I hav eto have exact place for referral.

## 2020-09-24 NOTE — Telephone Encounter (Signed)
The swelling starts at the right ankle and goes just above the right knee.  Pt also wants to increase her Baclofen for back spasms. Currently taking 10mg  BID

## 2020-10-10 ENCOUNTER — Other Ambulatory Visit: Payer: Self-pay | Admitting: Nurse Practitioner

## 2020-10-10 DIAGNOSIS — R569 Unspecified convulsions: Secondary | ICD-10-CM

## 2020-10-25 ENCOUNTER — Other Ambulatory Visit: Payer: Self-pay

## 2020-10-25 DIAGNOSIS — R2681 Unsteadiness on feet: Secondary | ICD-10-CM

## 2020-11-06 ENCOUNTER — Other Ambulatory Visit: Payer: Self-pay

## 2020-11-06 ENCOUNTER — Ambulatory Visit: Payer: PPO | Attending: Nurse Practitioner

## 2020-11-06 DIAGNOSIS — R262 Difficulty in walking, not elsewhere classified: Secondary | ICD-10-CM | POA: Diagnosis not present

## 2020-11-06 DIAGNOSIS — R2681 Unsteadiness on feet: Secondary | ICD-10-CM | POA: Diagnosis not present

## 2020-11-06 DIAGNOSIS — M62838 Other muscle spasm: Secondary | ICD-10-CM | POA: Diagnosis not present
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
## 2020-11-13 ENCOUNTER — Ambulatory Visit: Payer: PPO

## 2020-11-13 ENCOUNTER — Other Ambulatory Visit: Payer: Self-pay

## 2020-11-13 DIAGNOSIS — R2681 Unsteadiness on feet: Secondary | ICD-10-CM

## 2020-11-13 DIAGNOSIS — R262 Difficulty in walking, not elsewhere classified: Secondary | ICD-10-CM

## 2020-11-13 DIAGNOSIS — M62838 Other muscle spasm: Secondary | ICD-10-CM
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
## 2020-11-18 ENCOUNTER — Other Ambulatory Visit: Payer: Self-pay

## 2020-11-18 ENCOUNTER — Ambulatory Visit: Payer: PPO

## 2020-11-18 DIAGNOSIS — R2681 Unsteadiness on feet: Secondary | ICD-10-CM | POA: Diagnosis not present

## 2020-11-18 DIAGNOSIS — R262 Difficulty in walking, not elsewhere classified: Secondary | ICD-10-CM

## 2020-11-18 DIAGNOSIS — M62838 Other muscle spasm: Secondary | ICD-10-CM

## 2020-11-19 NOTE — Therapy (Signed)
[facility] [address] [place], [place], [address] Phone: [phone]   Fax:  [phone]  Physical Therapy Treatment  Patient Details  Name: Tenlee Teresa [place] MRN: [id number] Date of Birth: 05-28-1971 Referring Provider (PT): Benjamin Stain, FNP   Encounter Date: 11/18/2020   PT End of Session - 11/19/20 1656     Visit Number 3    Number of Visits 12    Date for PT Re-Evaluation 01/29/21    PT Start Time 1300    PT Stop Time 1350    PT Time Calculation (min) 50 min    Activity Tolerance Patient tolerated treatment well    Behavior During Therapy [facility] for tasks assessed/performed             Past Medical History:  Diagnosis Date   Allergy    DJD (degenerative joint disease)    GERD (gastroesophageal reflux disease)    Headache    Stroke [facility]) 1999   S/p childbirth-weakness rt leg   Thoracic injuries    Wears glasses     Past Surgical History:  Procedure Laterality Date   ARTHROSCOPY KNEE W/ DRILLING  2008   rt   REPLACEMENT TOTAL KNEE Right 2009   SEPTOPLASTY Bilateral 07/21/2013   Procedure: SEPTOPLASTY;  Surgeon: Suzanna Obey, MD;  Location: [facility];  Service: ENT;  Laterality: Bilateral;   SINUS ENDO W/FUSION Bilateral 07/21/2013   Procedure: ENDOSCOPIC SINUS SURGERY WITH FUSION NAVIGATION;  Surgeon: Suzanna Obey, MD;  Location: [facility];  Service: ENT;  Laterality: Bilateral;   TUBAL LIGATION  2000    There were no vitals filed for this visit.      11/18/20 1300  Symptoms/Limitations  Subjective Patient reports to clinic stating that her ankle seems to not hurt as bad but it continues to lock up on her. She states that she has been trying to do some of her exercises lately and the stretches help a good deal but it does not stay that way. She wore her AFO today and wants to know if she should try to switch to a different brace because the one she uses hurts her ankle more.   Limitations Walking  How long can you stand comfortably? 10 minutes  Patient Stated Goals To not have this ache in my ankle.  Pain Assessment  Currently in Pain? Yes  Pain Score 5  Pain Location Ankle  Pain Orientation Right  Pain Descriptors / Indicators Aching;Pressure;Sore  Pain Type Neuropathic pain  Pain Onset More than a month ago         [facility] Adult PT Treatment/Exercise - 11/18/20 1648       Therapeutic Activites    Therapeutic Activities Other Therapeutic Activities    Other Therapeutic Activities PT educated pt and discussed benefit/fails of KTape; and influence of standing joint approximation/weight shifting      Neuro Re-ed    Neuro Re-ed Details  PNF patterns movement for LE extension ; contract and relax from flexion to extenison; joint approximation with foot on therapist. Eversion encouraged through lesser toe tactile cueing      Manual Therapy   Manual Therapy Passive ROM;Muscle Energy Technique;Soft tissue mobilization;Joint mobilization    Manual therapy comments Manual STW/M to gastroc, ant tib,  post tib, adductor    Soft tissue mobilization paraspinals to decrease pain and tone    Passive ROM ankle DF/PF, knee valgus/varus platar surface manual therapy, D2 PNF pattern of flexion - isometric D2 contract/relax  Muscle Energy Technique contract and relax PNF patterns                PT Short Term Goals - 11/06/20 1350       PT SHORT TERM GOAL #1   Title The patient will be able to discuss multidisciplinary approach to her plan of care.    Time 2    Period Weeks    Status New    Target Date 11/20/20               PT Long Term Goals - 11/06/20 1350       PT LONG TERM GOAL #1   Title Patient will be independent with HEP    Time 6    Period Weeks    Status New      PT LONG TERM GOAL #2   Title Patient will be able to perform fuctional transfers without hypertonicity of the LLE.    Time 6    Period Weeks    Status New      PT  LONG TERM GOAL #3   Title Patient will be able to demonstrate full functional ROM of bil ankle without hypertonicity.    Time 6    Period Weeks      PT LONG TERM GOAL #4   Title Patient will be bale t owalk greater than 500' with minimal compensation to improve safety with community distances and independence with ADLs    Time 6    Period Weeks                   Plan - 11/18/20 1652     Clinical Impression Statement Participation and effort demonstrate excellent carry over to home exercise. Patient significanlty limited however d/t presence of hypoertonicity and spasticity of the R LE  - limited endurance of eversion on R LE attained. Progress to promote sympotm management and education on positional patterns that will alleviate patient spasticity. Continue per plan of care.    Personal Factors and Comorbidities Comorbidity 2;Comorbidity 1;Finances;Past/Current Experience;Time since onset of injury/illness/exacerbation    Examination-Activity Limitations Bed Mobility;Stand;Dressing;Sleep;Squat;Transfers;Stairs    Examination-Participation Restrictions Community Activity;Yard Work;Other    Stability/Clinical Decision Making Evolving/Moderate complexity    Clinical Decision Making Moderate    Rehab Potential Good    PT Frequency 2x / week    PT Duration 6 weeks    PT Treatment/Interventions ADLs/Self Care Home Management;Electrical Stimulation;Therapeutic activities;Therapeutic exercise;Neuromuscular re-education;Manual techniques;Passive range of motion;Energy conservation;Gait training;Patient/family education    PT Next Visit Plan PNF patterns discuss HEP and dry needling - discuss [facility].    Consulted and Agree with Plan of Care Patient             Patient will benefit from skilled therapeutic intervention in order to improve the following deficits and impairments:  Abnormal gait, Decreased range of motion, Decreased strength, Hypomobility, Increased muscle spasms,  Impaired flexibility, Impaired tone, Postural dysfunction, Improper body mechanics, Decreased balance, Difficulty walking  Visit Diagnosis: Unsteady gait  Other muscle spasm  Difficulty in walking, not elsewhere classified     Problem List Patient Active Problem List   Diagnosis Date Noted   Migraine 09/19/2019   Carpal tunnel syndrome of left wrist 10/16/2016   Allergic rhinitis 10/09/2013   GERD (gastroesophageal reflux disease) 10/09/2013   Late effect of cerebrovascular accident (CVA) 10/09/2013   Foot drop, right 10/09/2013   Rosacea, acne 10/09/2013   Seizures (HCC) 10/09/2013    Levonne Spiller PT,  DPT 11/19/2020, 5:01 PM  [facility] [address] [place], [place], [address] Phone: [phone]   Fax:  [phone]  Name: Sumayya Teresa [place] MRN: [id number] Date of Birth: 20-Oct-1970

## 2020-11-21 ENCOUNTER — Other Ambulatory Visit: Payer: Self-pay

## 2020-11-21 ENCOUNTER — Other Ambulatory Visit: Payer: Self-pay | Admitting: Nurse Practitioner

## 2020-11-21 ENCOUNTER — Ambulatory Visit: Payer: PPO

## 2020-11-21 DIAGNOSIS — R2681 Unsteadiness on feet: Secondary | ICD-10-CM | POA: Diagnosis not present

## 2020-11-21 DIAGNOSIS — R262 Difficulty in walking, not elsewhere classified: Secondary | ICD-10-CM

## 2020-11-21 DIAGNOSIS — M62838 Other muscle spasm: Secondary | ICD-10-CM
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
## 2020-11-25 ENCOUNTER — Ambulatory Visit: Payer: PPO

## 2020-11-25 ENCOUNTER — Other Ambulatory Visit: Payer: Self-pay

## 2020-11-25 DIAGNOSIS — R262 Difficulty in walking, not elsewhere classified: Secondary | ICD-10-CM

## 2020-11-25 DIAGNOSIS — M62838 Other muscle spasm: Secondary | ICD-10-CM

## 2020-11-25 DIAGNOSIS — R2681 Unsteadiness on feet: Secondary | ICD-10-CM | POA: Diagnosis not present
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
## 2020-11-28 ENCOUNTER — Ambulatory Visit: Payer: PPO

## 2020-11-28 ENCOUNTER — Other Ambulatory Visit: Payer: Self-pay

## 2020-11-28 DIAGNOSIS — M62838 Other muscle spasm: Secondary | ICD-10-CM

## 2020-11-28 DIAGNOSIS — R2681 Unsteadiness on feet: Secondary | ICD-10-CM

## 2020-11-28 DIAGNOSIS — R262 Difficulty in walking, not elsewhere classified: Secondary | ICD-10-CM
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
## 2020-12-02 ENCOUNTER — Other Ambulatory Visit: Payer: Self-pay

## 2020-12-02 ENCOUNTER — Ambulatory Visit: Payer: PPO

## 2020-12-02 DIAGNOSIS — R262 Difficulty in walking, not elsewhere classified: Secondary | ICD-10-CM

## 2020-12-02 DIAGNOSIS — R2681 Unsteadiness on feet: Secondary | ICD-10-CM | POA: Diagnosis not present

## 2020-12-02 DIAGNOSIS — M62838 Other muscle spasm: Secondary | ICD-10-CM
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
## 2020-12-04 ENCOUNTER — Telehealth: Payer: Self-pay | Admitting: Nurse Practitioner

## 2020-12-05 NOTE — Telephone Encounter (Signed)
Could not find cortney in system to send this to her. Does she know where patient can get botox injections for her muscle spams

## 2020-12-06 ENCOUNTER — Other Ambulatory Visit: Payer: Self-pay | Admitting: Nurse Practitioner

## 2020-12-06 NOTE — Telephone Encounter (Signed)
Per referrals [facility] does these injections as well as [facility]. Please refer

## 2020-12-10 ENCOUNTER — Ambulatory Visit: Payer: PPO | Attending: Nurse Practitioner

## 2020-12-10 ENCOUNTER — Other Ambulatory Visit: Payer: Self-pay | Admitting: Nurse Practitioner

## 2020-12-10 ENCOUNTER — Other Ambulatory Visit: Payer: Self-pay

## 2020-12-10 DIAGNOSIS — M62838 Other muscle spasm: Secondary | ICD-10-CM | POA: Insufficient documentation

## 2020-12-10 DIAGNOSIS — R262 Difficulty in walking, not elsewhere classified: Secondary | ICD-10-CM | POA: Insufficient documentation

## 2020-12-10 DIAGNOSIS — R2681 Unsteadiness on feet: Secondary | ICD-10-CM | POA: Insufficient documentation
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
## 2020-12-10 NOTE — Progress Notes (Signed)
Pltic

## 2020-12-17 ENCOUNTER — Ambulatory Visit: Payer: PPO

## 2020-12-17 ENCOUNTER — Encounter: Payer: Self-pay | Admitting: Nurse Practitioner

## 2020-12-17 ENCOUNTER — Other Ambulatory Visit: Payer: Self-pay

## 2020-12-17 ENCOUNTER — Ambulatory Visit (INDEPENDENT_AMBULATORY_CARE_PROVIDER_SITE_OTHER): Payer: PPO | Admitting: Nurse Practitioner

## 2020-12-17 DIAGNOSIS — M62838 Other muscle spasm: Secondary | ICD-10-CM

## 2020-12-17 DIAGNOSIS — R262 Difficulty in walking, not elsewhere classified: Secondary | ICD-10-CM

## 2020-12-17 DIAGNOSIS — F419 Anxiety disorder, unspecified: Secondary | ICD-10-CM

## 2020-12-17 DIAGNOSIS — R2681 Unsteadiness on feet: Secondary | ICD-10-CM

## 2020-12-17 MED ORDER — HYDROXYZINE PAMOATE 25 MG PO CAPS: 25.0000 mg | ORAL_CAPSULE | Freq: Two times a day (BID) | ORAL | 2 refills | Status: DC | PRN
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
## 2020-12-17 NOTE — Progress Notes (Signed)
   Virtual Visit  Note Due to COVID-19 pandemic this visit was conducted virtually. This visit type was conducted due to national recommendations for restrictions regarding the COVID-19 Pandemic (e.g. social distancing, sheltering in place) in an effort to limit this patient's exposure and mitigate transmission in our community. All issues noted in this document were discussed and addressed.  A physical exam was not performed with this format.  I connected with Cheryl Reeves on 12/17/20 at 3:59 by telephone and verified that I am speaking with the correct person using two identifiers. Cheryl Reeves is currently located at home and no one is currently with her during visit. The provider, [name], FNP is located in their office at time of visit.  I discussed the limitations, risks, security and privacy concerns of performing an evaluation and management service by telephone and the availability of in person appointments. I also discussed with the patient that there may be a patient responsible charge related to this service. The patient expressed understanding and agreed to proceed.   History and Present Illness:   Chief Complaint: Spasms (/)   HPI Patient has lots of muscle spasms. We have referral in for botox injections. Her therapist says that some of her muscle spasms may be coming from tension from anxiety. We stopped her xanax over a year ago. Therapist suggested trying hydroxyzine. Sh eis thinking that decrease anxiety will decrease some of her spasms.    Review of Systems  Musculoskeletal:        Musscle spasms in right leg  Psychiatric/Behavioral:  The patient is nervous/anxious.     Observations/Objective: Alert and oriented- answers all questions appropriately No distress   Assessment and Plan: Cheryl Reeves in today with chief complaint of Spasms (/)   1. Anxiety Stress mangement - hydrOXYzine (VISTARIL) 25 MG capsule; Take 1 capsule (25 mg  total) by mouth 2 (two) times daily as needed.  Dispense: 60 capsule; Refill: 2  2. Muscle spasm of right lower extremity Referral made to neurlogy - Ambulatory referral to Neurology     Follow Up Instructions: prn    I discussed the assessment and treatment plan with the patient. The patient was provided an opportunity to ask questions and all were answered. The patient agreed with the plan and demonstrated an understanding of the instructions.   The patient was advised to call back or seek an in-person evaluation if the symptoms worsen or if the condition fails to improve as anticipated.  The above assessment and management plan was discussed with the patient. The patient verbalized understanding of and has agreed to the management plan. Patient is aware to call the clinic if symptoms persist or worsen. Patient is aware when to return to the clinic for a follow-up visit. Patient educated on when it is appropriate to go to the emergency department.   Time call ended:  4:12  I provided 13 minutes of  non face-to-face time during this encounter.    [name], FNP

## 2020-12-19 ENCOUNTER — Ambulatory Visit: Payer: PPO

## 2020-12-19 ENCOUNTER — Other Ambulatory Visit: Payer: Self-pay

## 2020-12-19 DIAGNOSIS — M62838 Other muscle spasm: Secondary | ICD-10-CM

## 2020-12-19 DIAGNOSIS — R2681 Unsteadiness on feet: Secondary | ICD-10-CM

## 2020-12-19 DIAGNOSIS — R262 Difficulty in walking, not elsewhere classified: Secondary | ICD-10-CM
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
## 2020-12-24 ENCOUNTER — Ambulatory Visit: Payer: PPO

## 2021-01-01 ENCOUNTER — Other Ambulatory Visit: Payer: Self-pay

## 2021-01-01 ENCOUNTER — Ambulatory Visit
Admission: RE | Admit: 2021-01-01 | Discharge: 2021-01-01 | Disposition: A | Discharge status: Home / Self Care | Payer: PPO | Source: Ambulatory Visit | Attending: Nurse Practitioner | Admitting: Nurse Practitioner

## 2021-01-01 ENCOUNTER — Ambulatory Visit: Payer: PPO

## 2021-01-01 DIAGNOSIS — R2681 Unsteadiness on feet: Secondary | ICD-10-CM

## 2021-01-01 DIAGNOSIS — Z1231 Encounter for screening mammogram for malignant neoplasm of breast: Secondary | ICD-10-CM | POA: Diagnosis not present

## 2021-01-01 DIAGNOSIS — M62838 Other muscle spasm: Secondary | ICD-10-CM

## 2021-01-01 DIAGNOSIS — R262 Difficulty in walking, not elsewhere classified: Secondary | ICD-10-CM
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
## 2021-01-07 ENCOUNTER — Ambulatory Visit: Payer: PPO | Attending: Nurse Practitioner

## 2021-01-07 ENCOUNTER — Other Ambulatory Visit: Payer: Self-pay

## 2021-01-07 DIAGNOSIS — R262 Difficulty in walking, not elsewhere classified: Secondary | ICD-10-CM | POA: Diagnosis not present

## 2021-01-07 DIAGNOSIS — M62838 Other muscle spasm: Secondary | ICD-10-CM | POA: Diagnosis not present

## 2021-01-07 DIAGNOSIS — R2681 Unsteadiness on feet: Secondary | ICD-10-CM | POA: Insufficient documentation
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
## 2021-01-07 NOTE — Patient Instructions (Signed)
Exercises Standing Knee Flexion - 2 x daily - 7 x weekly - 1 sets - 10 reps Seated Hamstring Curls with Resistance - 1 x daily - 10 sets - 10sec hold Quadricep Stretch with Chair and Counter Support - 1 x daily - 3 sets - 20sec hold Supine 90/90 Single Leg Isometric Hip Extension - 1 x daily - 7 x weekly - 5 sets - 10sec hold Soleus Stretch on Wall - 1 x daily - 7 x weekly - 5 sets - 10sec hold Standing Ankle Dorsiflexion Stretch on Chair - 1 x daily - 7 x weekly - 3 sets - 10 reps Seated Gastroc Stretch with Strap - 1 x daily - 7 x weekly - 3 sets - 10 reps

## 2021-01-15 ENCOUNTER — Telehealth: Payer: Self-pay | Admitting: Nurse Practitioner

## 2021-01-15 NOTE — Telephone Encounter (Signed)
Pt would like to talk to [facility] nurse about a UTI. Offered to give her an appt and explained that she would need appt for medicine to be called in and she still requested to talk to a nurse before she wanted to make an appt.

## 2021-01-15 NOTE — Telephone Encounter (Signed)
Explained to patient that she would need to be seen to be prescribed medication.  Appointment scheduled tomorrow at 3:00 pm with [name].

## 2021-01-16 ENCOUNTER — Emergency Department (HOSPITAL_COMMUNITY): Payer: PPO

## 2021-01-16 ENCOUNTER — Telehealth: Payer: Self-pay | Admitting: Nurse Practitioner

## 2021-01-16 ENCOUNTER — Encounter (HOSPITAL_COMMUNITY): Payer: Self-pay | Admitting: Emergency Medicine

## 2021-01-16 ENCOUNTER — Emergency Department (HOSPITAL_COMMUNITY)
Admission: EM | Admit: 2021-01-16 | Discharge: 2021-01-16 | Disposition: A | Discharge status: Home / Self Care | Payer: PPO | Attending: Emergency Medicine | Admitting: Emergency Medicine

## 2021-01-16 ENCOUNTER — Other Ambulatory Visit: Payer: Self-pay

## 2021-01-16 ENCOUNTER — Ambulatory Visit: Payer: PPO | Admitting: Nurse Practitioner

## 2021-01-16 DIAGNOSIS — M549 Dorsalgia, unspecified: Secondary | ICD-10-CM | POA: Diagnosis not present

## 2021-01-16 DIAGNOSIS — N12 Tubulo-interstitial nephritis, not specified as acute or chronic: Secondary | ICD-10-CM | POA: Insufficient documentation

## 2021-01-16 DIAGNOSIS — Z20822 Contact with and (suspected) exposure to covid-19: Secondary | ICD-10-CM | POA: Insufficient documentation

## 2021-01-16 DIAGNOSIS — F17210 Nicotine dependence, cigarettes, uncomplicated: Secondary | ICD-10-CM | POA: Diagnosis not present

## 2021-01-16 DIAGNOSIS — Z96651 Presence of right artificial knee joint: Secondary | ICD-10-CM | POA: Diagnosis not present

## 2021-01-16 DIAGNOSIS — R509 Fever, unspecified: Secondary | ICD-10-CM | POA: Diagnosis not present

## 2021-01-16 DIAGNOSIS — R0689 Other abnormalities of breathing: Secondary | ICD-10-CM | POA: Diagnosis not present

## 2021-01-16 DIAGNOSIS — I10 Essential (primary) hypertension: Secondary | ICD-10-CM | POA: Diagnosis not present

## 2021-01-16 DIAGNOSIS — Z0389 Encounter for observation for other suspected diseases and conditions ruled out: Secondary | ICD-10-CM | POA: Diagnosis not present

## 2021-01-16 DIAGNOSIS — I959 Hypotension, unspecified: Secondary | ICD-10-CM | POA: Diagnosis not present

## 2021-01-16 DIAGNOSIS — Z7982 Long term (current) use of aspirin: Secondary | ICD-10-CM | POA: Insufficient documentation

## 2021-01-16 DIAGNOSIS — R109 Unspecified abdominal pain: Secondary | ICD-10-CM | POA: Diagnosis not present

## 2021-01-16 LAB — COMPREHENSIVE METABOLIC PANEL
ALT: 8 U/L (ref 0–44)
AST: 17 U/L (ref 15–41)
Albumin: 3.6 g/dL (ref 3.5–5.0)
Alkaline Phosphatase: 92 U/L (ref 38–126)
Anion gap: 6 (ref 5–15)
BUN: 10 mg/dL (ref 6–20)
CO2: 22 mmol/L (ref 22–32)
Calcium: 8.4 mg/dL — ABNORMAL LOW (ref 8.9–10.3)
Chloride: 106 mmol/L (ref 98–111)
Creatinine, Ser: 0.75 mg/dL (ref 0.44–1.00)
GFR, Estimated: >60 mL/min (ref >60)
Glucose, Bld: 153 mg/dL — ABNORMAL HIGH (ref 70–99)
Potassium: 3.3 mmol/L — ABNORMAL LOW (ref 3.5–5.1)
Sodium: 134 mmol/L — ABNORMAL LOW (ref 135–145)
Total Bilirubin: 1.1 mg/dL (ref 0.3–1.2)
Total Protein: 6.8 g/dL (ref 6.5–8.1)

## 2021-01-16 LAB — URINALYSIS, ROUTINE W REFLEX MICROSCOPIC
Bilirubin Urine: NEGATIVE
Glucose, UA: NEGATIVE mg/dL
Ketones, ur: 5 mg/dL — AB
Nitrite: NEGATIVE
Protein, ur: 100 mg/dL — AB
Specific Gravity, Urine: 1.01 (ref 1.005–1.030)
WBC, UA: >50 WBC/hpf — ABNORMAL HIGH (ref 0–5)
pH: 7 (ref 5.0–8.0)

## 2021-01-16 LAB — RESP PANEL BY RT-PCR (FLU A&B, COVID) ARPGX2
Influenza A by PCR: NEGATIVE
Influenza B by PCR: NEGATIVE
SARS Coronavirus 2 by RT PCR: NEGATIVE

## 2021-01-16 LAB — CBC WITH DIFFERENTIAL/PLATELET
Abs Immature Granulocytes: 0.04 10*3/uL (ref 0.00–0.07)
Basophils Absolute: 0 10*3/uL (ref 0.0–0.1)
Basophils Relative: 0 %
Eosinophils Absolute: 0 10*3/uL (ref 0.0–0.5)
Eosinophils Relative: 0 %
HCT: 37.1 % (ref 36.0–46.0)
Hemoglobin: 12.6 g/dL (ref 12.0–15.0)
Immature Granulocytes: 0 %
Lymphocytes Relative: 4 %
Lymphs Abs: 0.4 10*3/uL — ABNORMAL LOW (ref 0.7–4.0)
MCH: 35.1 pg — ABNORMAL HIGH (ref 26.0–34.0)
MCHC: 34 g/dL (ref 30.0–36.0)
MCV: 103.3 fL — ABNORMAL HIGH (ref 80.0–100.0)
Monocytes Absolute: 0.8 10*3/uL (ref 0.1–1.0)
Monocytes Relative: 9 %
Neutro Abs: 8 10*3/uL — ABNORMAL HIGH (ref 1.7–7.7)
Neutrophils Relative %: 87 %
Platelets: 164 10*3/uL (ref 150–400)
RBC: 3.59 MIL/uL — ABNORMAL LOW (ref 3.87–5.11)
RDW: 12.3 % (ref 11.5–15.5)
WBC: 9.3 10*3/uL (ref 4.0–10.5)
nRBC: 0 % (ref 0.0–0.2)

## 2021-01-16 LAB — PROTIME-INR
INR: 1 (ref 0.8–1.2)
Prothrombin Time: 13.2 seconds (ref 11.4–15.2)

## 2021-01-16 LAB — POC URINE PREG, ED: Preg Test, Ur: NEGATIVE

## 2021-01-16 LAB — APTT: aPTT: 26 seconds (ref 24–36)

## 2021-01-16 LAB — LACTIC ACID, PLASMA
Lactic Acid, Venous: 0.9 mmol/L (ref 0.5–1.9)
Lactic Acid, Venous: 1.2 mmol/L (ref 0.5–1.9)

## 2021-01-16 MED ORDER — LACTATED RINGERS IV SOLN: INTRAVENOUS | Status: DC

## 2021-01-16 MED ORDER — LACTATED RINGERS IV BOLUS (SEPSIS)
250.0000 mL | Freq: Once | INTRAVENOUS | Status: AC
  Administered 2021-01-16: 250 mL via INTRAVENOUS

## 2021-01-16 MED ORDER — SODIUM CHLORIDE 0.9 % IV SOLN
1.0000 g | Freq: Once | INTRAVENOUS | Status: AC
  Administered 2021-01-16: 1 g via INTRAVENOUS
  Filled 2021-01-16: qty 10

## 2021-01-16 MED ORDER — LACTATED RINGERS IV BOLUS (SEPSIS)
1000.0000 mL | Freq: Once | INTRAVENOUS | Status: AC
  Administered 2021-01-16: 1000 mL via INTRAVENOUS

## 2021-01-16 MED ORDER — IBUPROFEN 800 MG PO TABS
800.0000 mg | ORAL_TABLET | Freq: Three times a day (TID) | ORAL | 0 refills | Status: DC | PRN
Start: 2021-01-16 — End: 2022-02-24

## 2021-01-16 MED ORDER — ONDANSETRON 4 MG PO TBDP
ORAL_TABLET | ORAL | 0 refills | Status: DC
Start: 2021-01-16 — End: 2021-01-18

## 2021-01-16 MED ORDER — FLUCONAZOLE 150 MG PO TABS: 150.0000 mg | ORAL_TABLET | Freq: Once | ORAL | 0 refills | Status: AC

## 2021-01-16 MED ORDER — ACETAMINOPHEN 325 MG PO TABS
650.0000 mg | ORAL_TABLET | Freq: Once | ORAL | Status: AC
  Administered 2021-01-16: 650 mg via ORAL
  Filled 2021-01-16: qty 2

## 2021-01-16 MED ORDER — CEPHALEXIN 500 MG PO CAPS: 500.0000 mg | ORAL_CAPSULE | Freq: Four times a day (QID) | ORAL | 0 refills | Status: DC

## 2021-01-16 MED ORDER — ACETAMINOPHEN 325 MG PO TABS: 650.0000 mg | ORAL_TABLET | Freq: Once | ORAL | Status: DC

## 2021-01-16 NOTE — Progress Notes (Signed)
Elink following for code sepsis 

## 2021-01-16 NOTE — ED Triage Notes (Signed)
Pt arrived by [facility] for fever of 103.6, pt also c/o back pain, burning sensation with urination, cloudy urine and feeling like she has to pee but unable to go. EMS stated they gave 1 gram of rocephin, 30 toradol for possible sepsis.

## 2021-01-16 NOTE — Telephone Encounter (Signed)
Patient was given 10 days of antibiotics which she will finish on 01/26/21.  Appointment scheduled with Cheryl Reeves on 01/28/21 at 11:45 am, patient aware.

## 2021-01-16 NOTE — Discharge Instructions (Addendum)
Drink plenty of fluids.  Take Tylenol for fever.  Follow-up with your doctor next week.  Return sooner if vomiting or severe pain

## 2021-01-16 NOTE — ED Provider Notes (Signed)
[facility] EMERGENCY DEPARTMENT Provider Note   CSN: [id number] Arrival date & time: 01/16/21  2725     History Chief Complaint  Patient presents with   Fever    Cheryl Reeves is a 50 y.o. female.  Patient complains of fever and back pain and dysuria.  This has been going on for couple days.  The history is provided by the patient and medical records. No language interpreter was used.  Fever Temp source:  Oral Severity:  Moderate Onset quality:  Sudden Duration:  2 days Timing:  Constant Progression:  Worsening Chronicity:  New Relieved by:  Nothing Worsened by:  Nothing Ineffective treatments:  None tried Associated symptoms: no chest pain, no congestion, no cough, no diarrhea, no headaches and no rash   Risk factors: no contaminated food       Past Medical History:  Diagnosis Date   Allergy    DJD (degenerative joint disease)    GERD (gastroesophageal reflux disease)    Headache    Stroke ([facility]) 1999   S/p childbirth-weakness rt leg   Thoracic injuries    Wears glasses     Patient Active Problem List   Diagnosis Date Noted   Migraine 09/19/2019   Carpal tunnel syndrome of left wrist 10/16/2016   Allergic rhinitis 10/09/2013   GERD (gastroesophageal reflux disease) 10/09/2013   Late effect of cerebrovascular accident (CVA) 10/09/2013   Foot drop, right 10/09/2013   Rosacea, acne 10/09/2013   Seizures ([facility]) 10/09/2013    Past Surgical History:  Procedure Laterality Date   ARTHROSCOPY KNEE W/ DRILLING  2008   rt   REPLACEMENT TOTAL KNEE Right 2009   SEPTOPLASTY Bilateral 07/21/2013   Procedure: SEPTOPLASTY;  Surgeon: Suzanna Obey, MD;  Location: [facility];  Service: ENT;  Laterality: Bilateral;   SINUS ENDO W/FUSION Bilateral 07/21/2013   Procedure: ENDOSCOPIC SINUS SURGERY WITH FUSION NAVIGATION;  Surgeon: Suzanna Obey, MD;  Location: [facility];  Service: ENT;  Laterality: Bilateral;   TUBAL LIGATION  2000     OB  History   No obstetric history on file.     Family History  Problem Relation Age of Onset   Cancer Mother        lung   Arthritis Mother    Heart disease Father    Cancer Father        lung    Hypertension Father    Heart disease Brother    Hypertension Brother    Diabetes Other    Arthritis/Rheumatoid Other    Breast cancer Neg Hx     Social History   Tobacco Use   Smoking status: Every Day    Packs/day: 0.50    Years: 30.00    Pack years: 15.00    Types: Cigarettes   Smokeless tobacco: Never  Vaping Use   Vaping Use: Never used  Substance Use Topics   Alcohol use: Yes    Alcohol/week: 4.0 standard drinks    Types: 4 Cans of beer per week    Comment: occ   Drug use: Yes    Types: Marijuana    Comment: occasionally    Home Medications Prior to Admission medications   Medication Sig Start Date End Date Taking? Authorizing Provider  aspirin 81 MG tablet Take 81 mg by mouth daily.   Yes [provider]  atorvastatin (LIPITOR) 20 MG tablet Take 1 tablet (20 mg total) by mouth daily. 06/17/20  Yes Daphine Deutscher Mary-Margaret, FNP  baclofen (  LIORESAL) 10 MG tablet Take 1 tablet (10 mg total) by mouth 2 (two) times daily. 06/17/20  Yes Daphine DeutscherMartin, Mary-Margaret, FNP  buPROPion (WELLBUTRIN XL) 300 MG 24 hr tablet Take 1 tablet (300 mg total) by mouth daily. Needs appointment for further refills 09/16/20  Yes Daphine DeutscherMartin, Mary-Margaret, FNP  carbamazepine (TEGRETOL XR) 200 MG 12 hr tablet TAKE ONE CAPSULE BY MOUTH TWICE A DAY Patient taking differently: Take 200 mg by mouth 2 (two) times daily. 12/06/20  Yes Martin, Mary-Margaret, FNP  cephALEXin (KEFLEX) 500 MG capsule Take 1 capsule (500 mg total) by mouth 4 (four) times daily. 01/16/21  Yes Bethann BerkshireZammit, Verdine Grenfell, MD  fluconazole (DIFLUCAN) 150 MG tablet Take 1 tablet (150 mg total) by mouth once for 1 dose. 01/16/21 01/16/21 Yes Bethann BerkshireZammit, Jauan Wohl, MD  hydrOXYzine (VISTARIL) 25 MG capsule Take 1 capsule (25 mg total) by mouth 2 (two) times  daily as needed. Patient taking differently: Take 25 mg by mouth 2 (two) times daily as needed for anxiety. 12/17/20  Yes Daphine DeutscherMartin, Mary-Margaret, FNP  ibuprofen (ADVIL) 800 MG tablet Take 1 tablet (800 mg total) by mouth every 8 (eight) hours as needed for moderate pain. 01/16/21  Yes Bethann BerkshireZammit, Shams Fill, MD  lisinopril (ZESTRIL) 20 MG tablet TAKE ONE (1) TABLET EACH DAY Patient taking differently: Take 20 mg by mouth daily. 11/21/20  Yes Daphine DeutscherMartin, Mary-Margaret, FNP  ondansetron (ZOFRAN ODT) 4 MG disintegrating tablet 4mg  ODT q4 hours prn nausea/vomit 01/16/21  Yes Bethann BerkshireZammit, Shunta Mclaurin, MD  pantoprazole (PROTONIX) 40 MG tablet Take 1 tablet (40 mg total) by mouth daily. 06/17/20  Yes Martin, Mary-Margaret, FNP  carbamazepine (CARBATROL) 200 MG 12 hr capsule TAKE ONE CAPSULE BY MOUTH TWICE A DAY Patient not taking: Reported on 01/16/2021 10/10/20   Bennie PieriniMartin, Mary-Margaret, FNP  fluticasone (FLONASE) 50 MCG/ACT nasal spray Place 1 spray into both nostrils 2 (two) times daily as needed for allergies or rhinitis. Patient not taking: No sig reported 07/29/20   Dettinger, Elige RadonJoshua A, MD  venlafaxine XR (EFFEXOR XR) 37.5 MG 24 hr capsule Take 1 capsule (37.5 mg total) by mouth at bedtime. Patient not taking: Reported on 01/16/2021 09/16/20   Bennie PieriniMartin, Mary-Margaret, FNP    Allergies    Codeine  Review of Systems   Review of Systems  Constitutional:  Positive for fever. Negative for appetite change and fatigue.  HENT:  Negative for congestion, ear discharge and sinus pressure.   Eyes:  Negative for discharge.  Respiratory:  Negative for cough.   Cardiovascular:  Negative for chest pain.  Gastrointestinal:  Negative for abdominal pain and diarrhea.  Genitourinary:  Negative for frequency and hematuria.  Musculoskeletal:  Negative for back pain.  Skin:  Negative for rash.  Neurological:  Negative for seizures and headaches.  Psychiatric/Behavioral:  Negative for hallucinations.    Physical Exam Updated Vital Signs BP  132/84   Pulse 86   Temp 99.2 F (37.3 C) (Oral)   Resp 17   Ht 5\' 5"  (1.651 m)   Wt 69 kg   SpO2 96%   BMI 25.31 kg/m   Physical Exam Vitals and nursing note reviewed.  Constitutional:      Appearance: She is well-developed.  HENT:     Head: Normocephalic.     Nose: Nose normal.  Eyes:     General: No scleral icterus.    Conjunctiva/sclera: Conjunctivae normal.  Neck:     Thyroid: No thyromegaly.  Cardiovascular:     Rate and Rhythm: Normal rate and regular rhythm.  Heart sounds: No murmur heard.   No friction rub. No gallop.  Pulmonary:     Breath sounds: No stridor. No wheezing or rales.  Chest:     Chest wall: No tenderness.  Abdominal:     General: There is no distension.     Tenderness: There is no abdominal tenderness. There is no rebound.  Musculoskeletal:        General: Normal range of motion.     Cervical back: Neck supple.     Comments: Bilateral flank tenderness  Lymphadenopathy:     Cervical: No cervical adenopathy.  Skin:    Findings: No erythema or rash.  Neurological:     Mental Status: She is alert and oriented to person, place, and time.     Motor: No abnormal muscle tone.     Coordination: Coordination normal.  Psychiatric:        Behavior: Behavior normal.    ED Results / Procedures / Treatments   Labs (all labs ordered are listed, but only abnormal results are displayed) Labs Reviewed  COMPREHENSIVE METABOLIC PANEL - Abnormal; Notable for the following components:      Result Value   Sodium 134 (*)    Potassium 3.3 (*)    Glucose, Bld 153 (*)    Calcium 8.4 (*)    All other components within normal limits  CBC WITH DIFFERENTIAL/PLATELET - Abnormal; Notable for the following components:   RBC 3.59 (*)    MCV 103.3 (*)    MCH 35.1 (*)    Neutro Abs 8.0 (*)    Lymphs Abs 0.4 (*)    All other components within normal limits  URINALYSIS, ROUTINE W REFLEX MICROSCOPIC - Abnormal; Notable for the following components:   APPearance  HAZY (*)    Hgb urine dipstick MODERATE (*)    Ketones, ur 5 (*)    Protein, ur 100 (*)    Leukocytes,Ua LARGE (*)    WBC, UA >50 (*)    Bacteria, UA RARE (*)    All other components within normal limits  RESP PANEL BY RT-PCR (FLU A&B, COVID) ARPGX2  CULTURE, BLOOD (ROUTINE X 2)  CULTURE, BLOOD (ROUTINE X 2)  URINE CULTURE  LACTIC ACID, PLASMA  LACTIC ACID, PLASMA  PROTIME-INR  APTT  POC URINE PREG, ED  POC URINE PREG, ED    EKG None  Radiology DG Chest Port 1 View  Result Date: 01/16/2021 CLINICAL DATA:  50 year old female with possible sepsis. EXAM: PORTABLE CHEST 1 VIEW COMPARISON:  Chest radiographs 06/08/2007. abdominal radiographs 04/07/2012, 04/29/2015 FINDINGS: Portable AP upright view at 0737 hours. Mild-to-moderate elevation of the left hemidiaphragm is increased since 2016. But there is no confluent left lung base opacity. Mediastinal contours remain normal. Visualized tracheal air column is within normal limits. Allowing for portable technique the lungs are clear. No pneumothorax or pleural effusion. Paucity of bowel gas in the upper abdomen. No acute osseous abnormality identified. IMPRESSION: Elevation of the left hemidiaphragm seems increased since 2016, but no acute cardiopulmonary abnormality is identified. And there is a paucity of bowel gas in the upper abdomen. Electronically Signed   By: Odessa Fleming M.D.   On: 01/16/2021 08:00    Procedures Procedures   Medications Ordered in ED Medications  lactated ringers infusion ( Intravenous New Bag/Given 01/16/21 0821)  acetaminophen (TYLENOL) tablet 650 mg (650 mg Oral Not Given 01/16/21 0928)  lactated ringers bolus 1,000 mL (1,000 mLs Intravenous New Bag/Given 01/16/21 0755)    And  lactated  ringers bolus 1,000 mL (1,000 mLs Intravenous New Bag/Given 01/16/21 0757)    And  lactated ringers bolus 250 mL (0 mLs Intravenous Stopped 01/16/21 0818)  cefTRIAXone (ROCEPHIN) 1 g in sodium chloride 0.9 % 100 mL IVPB (0 g  Intravenous Stopped 01/16/21 0839)  acetaminophen (TYLENOL) tablet 650 mg (650 mg Oral Given 01/16/21 0753)    ED Course  I have reviewed the triage vital signs and the nursing notes.  Pertinent labs & imaging results that were available during my care of the patient were reviewed by me and considered in my medical decision making (see chart for details).    MDM Rules/Calculators/A&P                           Patient with pyelonephritis.  Patient is nontoxic.  Patient will be sent home with Keflex Zofran 800 Motrin and Diflucan and will follow up with her doctor Final Clinical Impression(s) / ED Diagnoses Final diagnoses:  Pyelonephritis    Rx / DC Orders ED Discharge Orders          Ordered    cephALEXin (KEFLEX) 500 MG capsule  4 times daily        01/16/21 1009    ondansetron (ZOFRAN ODT) 4 MG disintegrating tablet        01/16/21 1009    ibuprofen (ADVIL) 800 MG tablet  Every 8 hours PRN        01/16/21 1009    fluconazole (DIFLUCAN) 150 MG tablet   Once        01/16/21 1009             Bethann Berkshire, MD 01/16/21 1015

## 2021-01-17 ENCOUNTER — Other Ambulatory Visit: Payer: Self-pay

## 2021-01-17 ENCOUNTER — Emergency Department (HOSPITAL_COMMUNITY): Payer: PPO

## 2021-01-17 ENCOUNTER — Observation Stay (HOSPITAL_COMMUNITY)
Admission: EM | Admit: 2021-01-17 | Discharge: 2021-01-18 | Disposition: A | Discharge status: Home / Self Care | Payer: PPO | Attending: Internal Medicine | Admitting: Internal Medicine

## 2021-01-17 ENCOUNTER — Encounter (HOSPITAL_COMMUNITY): Payer: Self-pay

## 2021-01-17 DIAGNOSIS — N12 Tubulo-interstitial nephritis, not specified as acute or chronic: Secondary | ICD-10-CM | POA: Diagnosis not present

## 2021-01-17 DIAGNOSIS — Z96651 Presence of right artificial knee joint: Secondary | ICD-10-CM | POA: Insufficient documentation

## 2021-01-17 DIAGNOSIS — F17210 Nicotine dependence, cigarettes, uncomplicated: Secondary | ICD-10-CM | POA: Insufficient documentation

## 2021-01-17 DIAGNOSIS — N10 Acute tubulo-interstitial nephritis: Secondary | ICD-10-CM | POA: Diagnosis not present

## 2021-01-17 DIAGNOSIS — K8689 Other specified diseases of pancreas: Secondary | ICD-10-CM | POA: Diagnosis not present

## 2021-01-17 DIAGNOSIS — R109 Unspecified abdominal pain: Secondary | ICD-10-CM | POA: Diagnosis present

## 2021-01-17 DIAGNOSIS — K5730 Diverticulosis of large intestine without perforation or abscess without bleeding: Secondary | ICD-10-CM | POA: Diagnosis not present

## 2021-01-17 DIAGNOSIS — Z79899 Other long term (current) drug therapy: Secondary | ICD-10-CM | POA: Diagnosis not present

## 2021-01-17 DIAGNOSIS — N2882 Megaloureter: Secondary | ICD-10-CM | POA: Diagnosis not present

## 2021-01-17 DIAGNOSIS — I10 Essential (primary) hypertension: Secondary | ICD-10-CM | POA: Diagnosis not present

## 2021-01-17 DIAGNOSIS — Q632 Ectopic kidney: Secondary | ICD-10-CM | POA: Diagnosis not present

## 2021-01-17 DIAGNOSIS — Z7982 Long term (current) use of aspirin: Secondary | ICD-10-CM | POA: Diagnosis not present

## 2021-01-17 LAB — COMPREHENSIVE METABOLIC PANEL
ALT: 7 U/L (ref 0–44)
AST: 17 U/L (ref 15–41)
Albumin: 3 g/dL — ABNORMAL LOW (ref 3.5–5.0)
Alkaline Phosphatase: 85 U/L (ref 38–126)
Anion gap: 5 (ref 5–15)
BUN: 9 mg/dL (ref 6–20)
CO2: 24 mmol/L (ref 22–32)
Calcium: 8.6 mg/dL — ABNORMAL LOW (ref 8.9–10.3)
Chloride: 105 mmol/L (ref 98–111)
Creatinine, Ser: 0.71 mg/dL (ref 0.44–1.00)
GFR, Estimated: >60 mL/min (ref >60)
Glucose, Bld: 119 mg/dL — ABNORMAL HIGH (ref 70–99)
Potassium: 3.2 mmol/L — ABNORMAL LOW (ref 3.5–5.1)
Sodium: 134 mmol/L — ABNORMAL LOW (ref 135–145)
Total Bilirubin: 0.8 mg/dL (ref 0.3–1.2)
Total Protein: 6 g/dL — ABNORMAL LOW (ref 6.5–8.1)

## 2021-01-17 LAB — CBC WITH DIFFERENTIAL/PLATELET
Abs Immature Granulocytes: 0.08 10*3/uL — ABNORMAL HIGH (ref 0.00–0.07)
Basophils Absolute: 0 10*3/uL (ref 0.0–0.1)
Basophils Relative: 0 %
Eosinophils Absolute: 0 10*3/uL (ref 0.0–0.5)
Eosinophils Relative: 0 %
HCT: 32.1 % — ABNORMAL LOW (ref 36.0–46.0)
Hemoglobin: 10.7 g/dL — ABNORMAL LOW (ref 12.0–15.0)
Immature Granulocytes: 1 %
Lymphocytes Relative: 7 %
Lymphs Abs: 0.5 10*3/uL — ABNORMAL LOW (ref 0.7–4.0)
MCH: 34.3 pg — ABNORMAL HIGH (ref 26.0–34.0)
MCHC: 33.3 g/dL (ref 30.0–36.0)
MCV: 102.9 fL — ABNORMAL HIGH (ref 80.0–100.0)
Monocytes Absolute: 0.7 10*3/uL (ref 0.1–1.0)
Monocytes Relative: 8 %
Neutro Abs: 6.6 10*3/uL (ref 1.7–7.7)
Neutrophils Relative %: 84 %
Platelets: 140 10*3/uL — ABNORMAL LOW (ref 150–400)
RBC: 3.12 MIL/uL — ABNORMAL LOW (ref 3.87–5.11)
RDW: 12.2 % (ref 11.5–15.5)
WBC: 7.9 10*3/uL (ref 4.0–10.5)
nRBC: 0 % (ref 0.0–0.2)

## 2021-01-17 LAB — CBC
HCT: 34.5 % — ABNORMAL LOW (ref 36.0–46.0)
Hemoglobin: 11.3 g/dL — ABNORMAL LOW (ref 12.0–15.0)
MCH: 34.8 pg — ABNORMAL HIGH (ref 26.0–34.0)
MCHC: 32.8 g/dL (ref 30.0–36.0)
MCV: 106.2 fL — ABNORMAL HIGH (ref 80.0–100.0)
Platelets: 128 10*3/uL — ABNORMAL LOW (ref 150–400)
RBC: 3.25 MIL/uL — ABNORMAL LOW (ref 3.87–5.11)
RDW: 12.3 % (ref 11.5–15.5)
WBC: 7.1 10*3/uL (ref 4.0–10.5)
nRBC: 0 % (ref 0.0–0.2)

## 2021-01-17 LAB — CREATININE, SERUM
Creatinine, Ser: 0.85 mg/dL (ref 0.44–1.00)
GFR, Estimated: >60 mL/min (ref >60)

## 2021-01-17 LAB — HIV ANTIBODY (ROUTINE TESTING W REFLEX): HIV Screen 4th Generation wRfx: NONREACTIVE

## 2021-01-17 LAB — LACTIC ACID, PLASMA: Lactic Acid, Venous: 0.8 mmol/L (ref 0.5–1.9)

## 2021-01-17 MED ORDER — ONDANSETRON HCL 4 MG/2ML IJ SOLN
4.0000 mg | Freq: Four times a day (QID) | INTRAMUSCULAR | Status: DC | PRN
  Administered 2021-01-17: 4 mg via INTRAVENOUS
  Filled 2021-01-17: qty 2

## 2021-01-17 MED ORDER — ACETAMINOPHEN 650 MG RE SUPP: 650.0000 mg | Freq: Four times a day (QID) | RECTAL | Status: DC | PRN

## 2021-01-17 MED ORDER — HYDROXYZINE HCL 25 MG PO TABS: 25.0000 mg | ORAL_TABLET | Freq: Two times a day (BID) | ORAL | Status: DC | PRN

## 2021-01-17 MED ORDER — BUPROPION HCL ER (XL) 300 MG PO TB24
300.0000 mg | ORAL_TABLET | Freq: Every day | ORAL | Status: DC
  Administered 2021-01-17 – 2021-01-18 (×2): 300 mg via ORAL
  Filled 2021-01-17: qty 1
  Filled 2021-01-17: qty 2
  Filled 2021-01-17: qty 1

## 2021-01-17 MED ORDER — ENOXAPARIN SODIUM 40 MG/0.4ML IJ SOSY
40.0000 mg | PREFILLED_SYRINGE | Freq: Every day | INTRAMUSCULAR | Status: DC
  Filled 2021-01-17: qty 0.4

## 2021-01-17 MED ORDER — FLUTICASONE PROPIONATE 50 MCG/ACT NA SUSP: 1.0000 | Freq: Two times a day (BID) | NASAL | Status: DC | PRN

## 2021-01-17 MED ORDER — BACLOFEN 10 MG PO TABS
10.0000 mg | ORAL_TABLET | Freq: Two times a day (BID) | ORAL | Status: DC
  Administered 2021-01-18: 10 mg via ORAL
  Filled 2021-01-17 (×2): qty 1

## 2021-01-17 MED ORDER — KETOROLAC TROMETHAMINE 30 MG/ML IJ SOLN
30.0000 mg | Freq: Four times a day (QID) | INTRAMUSCULAR | Status: DC | PRN
  Administered 2021-01-17 – 2021-01-18 (×3): 30 mg via INTRAVENOUS
  Filled 2021-01-17 (×3): qty 1

## 2021-01-17 MED ORDER — IBUPROFEN 800 MG PO TABS
800.0000 mg | ORAL_TABLET | Freq: Three times a day (TID) | ORAL | Status: DC | PRN
  Administered 2021-01-17: 800 mg via ORAL
  Filled 2021-01-17: qty 1

## 2021-01-17 MED ORDER — SODIUM CHLORIDE 0.9 % IV BOLUS
500.0000 mL | Freq: Once | INTRAVENOUS | Status: AC
  Administered 2021-01-17: 500 mL via INTRAVENOUS

## 2021-01-17 MED ORDER — PANTOPRAZOLE SODIUM 40 MG PO TBEC
40.0000 mg | DELAYED_RELEASE_TABLET | Freq: Every day | ORAL | Status: DC
  Administered 2021-01-18: 40 mg via ORAL
  Filled 2021-01-17 (×2): qty 1

## 2021-01-17 MED ORDER — ATORVASTATIN CALCIUM 20 MG PO TABS
20.0000 mg | ORAL_TABLET | Freq: Every evening | ORAL | Status: DC
  Administered 2021-01-17: 20 mg via ORAL
  Filled 2021-01-17: qty 1

## 2021-01-17 MED ORDER — CARBAMAZEPINE ER 100 MG PO TB12
200.0000 mg | ORAL_TABLET | Freq: Two times a day (BID) | ORAL | Status: DC
  Administered 2021-01-17 – 2021-01-18 (×2): 200 mg via ORAL
  Filled 2021-01-17 (×2): qty 1
  Filled 2021-01-17: qty 2
  Filled 2021-01-17 (×4): qty 1
  Filled 2021-01-17: qty 2
  Filled 2021-01-17: qty 1

## 2021-01-17 MED ORDER — ONDANSETRON HCL 4 MG PO TABS
4.0000 mg | ORAL_TABLET | Freq: Four times a day (QID) | ORAL | Status: DC | PRN
  Administered 2021-01-17 – 2021-01-18 (×2): 4 mg via ORAL
  Filled 2021-01-17 (×2): qty 1

## 2021-01-17 MED ORDER — SODIUM CHLORIDE 0.9 % IV SOLN: INTRAVENOUS | Status: AC

## 2021-01-17 MED ORDER — POLYETHYLENE GLYCOL 3350 17 G PO PACK: 17.0000 g | PACK | Freq: Every day | ORAL | Status: DC | PRN

## 2021-01-17 MED ORDER — SODIUM CHLORIDE 0.9 % IV SOLN
2.0000 g | Freq: Once | INTRAVENOUS | Status: AC
  Administered 2021-01-17: 2 g via INTRAVENOUS
  Filled 2021-01-17: qty 20

## 2021-01-17 MED ORDER — SODIUM CHLORIDE 0.9 % IV SOLN
2.0000 g | INTRAVENOUS | Status: DC
  Administered 2021-01-18: 2 g via INTRAVENOUS
  Filled 2021-01-17: qty 20

## 2021-01-17 MED ORDER — HYDROMORPHONE HCL 1 MG/ML IJ SOLN
1.0000 mg | Freq: Once | INTRAMUSCULAR | Status: AC
Start: 2021-01-17 — End: 2021-01-17
  Administered 2021-01-17: 1 mg via INTRAVENOUS
  Filled 2021-01-17: qty 1

## 2021-01-17 MED ORDER — LISINOPRIL 10 MG PO TABS
20.0000 mg | ORAL_TABLET | Freq: Every day | ORAL | Status: DC
  Administered 2021-01-18: 20 mg via ORAL
  Filled 2021-01-17 (×2): qty 2

## 2021-01-17 MED ORDER — POTASSIUM CHLORIDE CRYS ER 20 MEQ PO TBCR
40.0000 meq | EXTENDED_RELEASE_TABLET | Freq: Once | ORAL | Status: DC
  Filled 2021-01-17: qty 2

## 2021-01-17 MED ORDER — ASPIRIN EC 81 MG PO TBEC
81.0000 mg | DELAYED_RELEASE_TABLET | Freq: Every day | ORAL | Status: DC
  Administered 2021-01-18: 81 mg via ORAL
  Filled 2021-01-17 (×2): qty 1

## 2021-01-17 MED ORDER — ONDANSETRON HCL 4 MG/2ML IJ SOLN
4.0000 mg | Freq: Once | INTRAMUSCULAR | Status: AC
  Administered 2021-01-17: 4 mg via INTRAVENOUS
  Filled 2021-01-17: qty 2

## 2021-01-17 MED ORDER — ACETAMINOPHEN 325 MG PO TABS: 650.0000 mg | ORAL_TABLET | Freq: Four times a day (QID) | ORAL | Status: DC | PRN

## 2021-01-17 NOTE — ED Notes (Signed)
Gave pt a cup of ice per RN

## 2021-01-17 NOTE — ED Provider Notes (Signed)
[facility] EMERGENCY DEPARTMENT Provider Note   CSN: [id number] Arrival date & time: 01/17/21  6314     History Chief Complaint  Patient presents with   Flank Pain    DX with Kidney infection yesterday    Anmol Teresa [place] is a 50 y.o. female.  Patient complains of fever and left flank pain.  Patient states that she continued the fever since yesterday and the pain is gotten worse  The history is provided by the patient and medical records. No language interpreter was used.  Flank Pain This is a recurrent problem. The current episode started 12 to 24 hours ago. The problem occurs constantly. The problem has not changed since onset.Pertinent negatives include no chest pain, no abdominal pain and no headaches. Nothing aggravates the symptoms. Nothing relieves the symptoms. She has tried nothing for the symptoms.      Past Medical History:  Diagnosis Date   Allergy    DJD (degenerative joint disease)    GERD (gastroesophageal reflux disease)    Headache    Stroke (HCC) 1999   S/p childbirth-weakness rt leg   Thoracic injuries    Wears glasses     Patient Active Problem List   Diagnosis Date Noted   Acute pyelonephritis 01/17/2021   Migraine 09/19/2019   Carpal tunnel syndrome of left wrist 10/16/2016   Allergic rhinitis 10/09/2013   GERD (gastroesophageal reflux disease) 10/09/2013   Late effect of cerebrovascular accident (CVA) 10/09/2013   Foot drop, right 10/09/2013   Rosacea, acne 10/09/2013   Seizures (HCC) 10/09/2013    Past Surgical History:  Procedure Laterality Date   ARTHROSCOPY KNEE W/ DRILLING  2008   rt   REPLACEMENT TOTAL KNEE Right 2009   SEPTOPLASTY Bilateral 07/21/2013   Procedure: SEPTOPLASTY;  Surgeon: Suzanna Obey, MD;  Location: [facility];  Service: ENT;  Laterality: Bilateral;   SINUS ENDO W/FUSION Bilateral 07/21/2013   Procedure: ENDOSCOPIC SINUS SURGERY WITH FUSION NAVIGATION;  Surgeon: Suzanna Obey, MD;  Location: [facility];  Service: ENT;  Laterality: Bilateral;   TUBAL LIGATION  2000     OB History   No obstetric history on file.     Family History  Problem Relation Age of Onset   Cancer Mother        lung   Arthritis Mother    Heart disease Father    Cancer Father        lung    Hypertension Father    Heart disease Brother    Hypertension Brother    Diabetes Other    Arthritis/Rheumatoid Other    Breast cancer Neg Hx     Social History   Tobacco Use   Smoking status: Every Day    Packs/day: 0.50    Years: 30.00    Pack years: 15.00    Types: Cigarettes   Smokeless tobacco: Never  Vaping Use   Vaping Use: Never used  Substance Use Topics   Alcohol use: Yes    Alcohol/week: 4.0 standard drinks    Types: 4 Cans of beer per week    Comment: occ   Drug use: Yes    Types: Marijuana    Comment: occasionally    Home Medications Prior to Admission medications   Medication Sig Start Date End Date Taking? Authorizing Provider  aspirin 81 MG tablet Take 81 mg by mouth daily.   Yes [provider]  atorvastatin (LIPITOR) 20 MG tablet Take 1 tablet (20 mg total)  by mouth daily. 06/17/20  Yes Daphine Deutscher, Mary-Margaret, FNP  baclofen (LIORESAL) 10 MG tablet Take 1 tablet (10 mg total) by mouth 2 (two) times daily. 06/17/20  Yes Daphine Deutscher, Mary-Margaret, FNP  buPROPion (WELLBUTRIN XL) 300 MG 24 hr tablet Take 1 tablet (300 mg total) by mouth daily. Needs appointment for further refills 09/16/20  Yes Daphine Deutscher, Mary-Margaret, FNP  carbamazepine (TEGRETOL XR) 200 MG 12 hr tablet TAKE ONE CAPSULE BY MOUTH TWICE A DAY Patient taking differently: Take 200 mg by mouth 2 (two) times daily. 12/06/20  Yes Martin, Mary-Margaret, FNP  cephALEXin (KEFLEX) 500 MG capsule Take 1 capsule (500 mg total) by mouth 4 (four) times daily. 01/16/21  Yes Bethann Berkshire, MD  fluconazole (DIFLUCAN) 150 MG tablet Take 150 mg by mouth once. 01/16/21  Yes [provider]  hydrOXYzine (VISTARIL) 25 MG  capsule Take 1 capsule (25 mg total) by mouth 2 (two) times daily as needed. Patient taking differently: Take 25 mg by mouth 2 (two) times daily as needed for anxiety. 12/17/20  Yes Daphine Deutscher, Mary-Margaret, FNP  ibuprofen (ADVIL) 800 MG tablet Take 1 tablet (800 mg total) by mouth every 8 (eight) hours as needed for moderate pain. 01/16/21  Yes Bethann Berkshire, MD  lisinopril (ZESTRIL) 20 MG tablet TAKE ONE (1) TABLET EACH DAY Patient taking differently: Take 20 mg by mouth daily. 11/21/20  Yes Daphine Deutscher, Mary-Margaret, FNP  ondansetron (ZOFRAN ODT) 4 MG disintegrating tablet 4mg  ODT q4 hours prn nausea/vomit 01/16/21  Yes 03/18/21, MD  pantoprazole (PROTONIX) 40 MG tablet Take 1 tablet (40 mg total) by mouth daily. 06/17/20  Yes Martin, Mary-Margaret, FNP  carbamazepine (CARBATROL) 200 MG 12 hr capsule TAKE ONE CAPSULE BY MOUTH TWICE A DAY Patient not taking: No sig reported 10/10/20   12/10/20, Mary-Margaret, FNP  fluticasone (FLONASE) 50 MCG/ACT nasal spray Place 1 spray into both nostrils 2 (two) times daily as needed for allergies or rhinitis. Patient not taking: No sig reported 07/29/20   Dettinger, 07/31/20, MD  venlafaxine XR (EFFEXOR XR) 37.5 MG 24 hr capsule Take 1 capsule (37.5 mg total) by mouth at bedtime. Patient not taking: No sig reported 09/16/20   11/16/20, FNP    Allergies    Codeine  Review of Systems   Review of Systems  Constitutional:  Positive for fever. Negative for appetite change and fatigue.  HENT:  Negative for congestion, ear discharge and sinus pressure.   Eyes:  Negative for discharge.  Respiratory:  Negative for cough.   Cardiovascular:  Negative for chest pain.  Gastrointestinal:  Negative for abdominal pain and diarrhea.  Genitourinary:  Positive for flank pain. Negative for frequency and hematuria.  Musculoskeletal:  Negative for back pain.  Skin:  Negative for rash.  Neurological:  Negative for seizures and headaches.  Psychiatric/Behavioral:   Negative for hallucinations.    Physical Exam Updated Vital Signs BP 109/73   Pulse 81   Temp 98 F (36.7 C) (Oral)   Resp 16   Ht 5\' 5"  (1.651 m)   Wt 69 kg   SpO2 92%   BMI 25.31 kg/m   Physical Exam Vitals and nursing note reviewed.  Constitutional:      Appearance: She is well-developed.  HENT:     Head: Normocephalic.     Nose: Nose normal.  Eyes:     General: No scleral icterus.    Conjunctiva/sclera: Conjunctivae normal.  Neck:     Thyroid: No thyromegaly.  Cardiovascular:     Rate and  Rhythm: Normal rate and regular rhythm.     Heart sounds: No murmur heard.   No friction rub. No gallop.  Pulmonary:     Breath sounds: No stridor. No wheezing or rales.  Chest:     Chest wall: No tenderness.  Abdominal:     General: There is no distension.     Tenderness: There is no abdominal tenderness. There is no rebound.  Musculoskeletal:        General: Normal range of motion.     Cervical back: Neck supple.     Comments: Tender left flank  Lymphadenopathy:     Cervical: No cervical adenopathy.  Skin:    Findings: No erythema or rash.  Neurological:     Mental Status: She is alert and oriented to person, place, and time.     Motor: No abnormal muscle tone.     Coordination: Coordination normal.  Psychiatric:        Behavior: Behavior normal.    ED Results / Procedures / Treatments   Labs (all labs ordered are listed, but only abnormal results are displayed) Labs Reviewed  CBC WITH DIFFERENTIAL/PLATELET - Abnormal; Notable for the following components:      Result Value   RBC 3.12 (*)    Hemoglobin 10.7 (*)    HCT 32.1 (*)    MCV 102.9 (*)    MCH 34.3 (*)    Platelets 140 (*)    Lymphs Abs 0.5 (*)    Abs Immature Granulocytes 0.08 (*)    All other components within normal limits  COMPREHENSIVE METABOLIC PANEL - Abnormal; Notable for the following components:   Sodium 134 (*)    Potassium 3.2 (*)    Glucose, Bld 119 (*)    Calcium 8.6 (*)    Total  Protein 6.0 (*)    Albumin 3.0 (*)    All other components within normal limits  CULTURE, BLOOD (ROUTINE X 2)  CULTURE, BLOOD (ROUTINE X 2)  LACTIC ACID, PLASMA    EKG None  Radiology DG Chest Port 1 View  Result Date: 01/16/2021 CLINICAL DATA:  50 year old female with possible sepsis. EXAM: PORTABLE CHEST 1 VIEW COMPARISON:  Chest radiographs 06/08/2007. abdominal radiographs 04/07/2012, 04/29/2015 FINDINGS: Portable AP upright view at 0737 hours. Mild-to-moderate elevation of the left hemidiaphragm is increased since 2016. But there is no confluent left lung base opacity. Mediastinal contours remain normal. Visualized tracheal air column is within normal limits. Allowing for portable technique the lungs are clear. No pneumothorax or pleural effusion. Paucity of bowel gas in the upper abdomen. No acute osseous abnormality identified. IMPRESSION: Elevation of the left hemidiaphragm seems increased since 2016, but no acute cardiopulmonary abnormality is identified. And there is a paucity of bowel gas in the upper abdomen. Electronically Signed   By: Odessa FlemingH  Hall M.D.   On: 01/16/2021 08:00   CT Renal Stone Study  Result Date: 01/17/2021 CLINICAL DATA:  Flank pain, kidney stone suspected. EXAM: CT ABDOMEN AND PELVIS WITHOUT CONTRAST TECHNIQUE: Multidetector CT imaging of the abdomen and pelvis was performed following the standard protocol without IV contrast. COMPARISON:  None. FINDINGS: Lower chest: Subsegmental atelectasis in the lingula and lower lobes, left greater than right. Hepatobiliary: No focal liver abnormality is seen. No gallstones, gallbladder wall thickening, or biliary dilatation. Pancreas: Diffuse mild parenchymal atrophy. No pancreatic ductal dilatation or surrounding inflammatory changes. Spleen: Normal in size without focal abnormality. Adrenals/Urinary Tract: Mild dilatation of the upper and middle thirds of the left ureter with  mild periureteral fat stranding. Left perinephric fat  stranding is also noted. No perinephric fluid collection. No calcified stones seen within the left renal collecting system or urinary bladder. Congenital malrotation of the right kidney. No right hydronephrosis or renal stone. Stomach/Bowel: Stomach is within normal limits. Appendix appears normal. No evidence of bowel wall thickening, distention, or inflammatory changes. Diverticulosis of the descending and sigmoid colon without findings of diverticulitis. Vascular/Lymphatic: Mild-moderate calcific atherosclerosis of the distal abdominal aorta and proximal common iliac arteries. The abdominal aorta is normal in caliber. IVC filter is in appropriate position. No enlarged abdominal or pelvic lymph nodes. Reproductive: Uterus and bilateral adnexa are unremarkable. Other: No abdominal wall hernia or abnormality. No abdominopelvic ascites. Musculoskeletal: No acute or significant osseous findings. IMPRESSION: 1. Mild dilatation the upper and middle thirds of the left ureter with mild perinephric and periureteral fat stranding, suggestive of recent stone passage. No calcified stone is seen within the left renal collecting system or urinary bladder. Of note, evaluation for pyelonephritis is limited without intravenous contrast. 2. Diverticulosis of the descending and sigmoid colon without findings of diverticulitis. 3. Mild-moderate calcific atherosclerosis of the distal abdominal aorta and proximal common iliac arteries. Aortic Atherosclerosis (ICD10-I70.0). Electronically Signed   By: Sherron Ales MD   On: 01/17/2021 09:33    Procedures Procedures   Medications Ordered in ED Medications  sodium chloride 0.9 % bolus 500 mL (500 mLs Intravenous New Bag/Given 01/17/21 0746)  HYDROmorphone (DILAUDID) injection 1 mg (1 mg Intravenous Given 01/17/21 0751)  ondansetron (ZOFRAN) injection 4 mg (4 mg Intravenous Given 01/17/21 0749)  cefTRIAXone (ROCEPHIN) 2 g in sodium chloride 0.9 % 100 mL IVPB (2 g Intravenous New  Bag/Given 01/17/21 0748)    ED Course  I have reviewed the triage vital signs and the nursing notes.  Pertinent labs & imaging results that were available during my care of the patient were reviewed by me and considered in my medical decision making (see chart for details).    MDM Rules/Calculators/A&P                           CT of her kidneys show possible passed kidney stone.  I suspect the patient has pyelonephritis.  She continues to have pain and fever.  She will be admitted for IV antibiotics and pain control Final Clinical Impression(s) / ED Diagnoses Final diagnoses:  None    Rx / DC Orders ED Discharge Orders     None        Bethann Berkshire, MD 01/17/21 1048

## 2021-01-17 NOTE — ED Triage Notes (Signed)
Pt was seen yesterday treated for kidney infection, pt reports has pain and fever today.  Flank pain and back pain 7/10.  Temp 98 skin warm and dry, alert and oriented, vitals WNL

## 2021-01-17 NOTE — H&P (Signed)
History and Physical    Cheryl Reeves ZOX:[id number] DOB: [birth year] DOA: 01/17/2021  PCP: Bennie PieriniMartin, Mary-Margaret, FNP   Patient coming from: Home  Chief Complaint: Flank/back pain and fever  HPI: Cheryl Reeves is a 50 y.o. female with medical history significant for tobacco abuse, hypertension, dyslipidemia, prior stroke with seizure disorder, and DJD who presented to the ED initially on 8/11 with complaints of back pain and fever that was ongoing for couple days.  She was also noted to have some burning with urination and was diagnosed with pyelonephritis and sent home on Keflex as well as Diflucan.  She returns today with ongoing symptoms of fever and flank pain rated 7/10.  She has been given some IV fluid and Rocephin.  She continues to have some nausea but no vomiting.   ED Course: Stable vital signs noted and patient is afebrile.  Hemoglobin 10.7 and no significant leukocytosis noted.  CT of the abdomen pelvis demonstrates some mild stranding of the upper left ureter suggestive of pyelonephritis.  Patient states that she is too ill to go home.  Review of Systems: Reviewed as noted above, otherwise negative.  Past Medical History:  Diagnosis Date   Allergy    DJD (degenerative joint disease)    GERD (gastroesophageal reflux disease)    Headache    Stroke (HCC) 1999   S/p childbirth-weakness rt leg   Thoracic injuries    Wears glasses     Past Surgical History:  Procedure Laterality Date   ARTHROSCOPY KNEE W/ DRILLING  2008   rt   REPLACEMENT TOTAL KNEE Right 2009   SEPTOPLASTY Bilateral 07/21/2013   Procedure: SEPTOPLASTY;  Surgeon: Suzanna ObeyJohn Byers, MD;  Location: [facility];  Service: ENT;  Laterality: Bilateral;   SINUS ENDO W/FUSION Bilateral 07/21/2013   Procedure: ENDOSCOPIC SINUS SURGERY WITH FUSION NAVIGATION;  Surgeon: Suzanna ObeyJohn Byers, MD;  Location: [facility];  Service: ENT;  Laterality: Bilateral;   TUBAL LIGATION  2000     reports  that she has been smoking cigarettes. She has a 15.00 pack-year smoking history. She has never used smokeless tobacco. She reports current alcohol use of about 4.0 standard drinks per week. She reports current drug use. Drug: Marijuana.  Allergies  Allergen Reactions   Codeine     headaches    Family History  Problem Relation Age of Onset   Cancer Mother        lung   Arthritis Mother    Heart disease Father    Cancer Father        lung    Hypertension Father    Heart disease Brother    Hypertension Brother    Diabetes Other    Arthritis/Rheumatoid Other    Breast cancer Neg Hx     Prior to Admission medications   Medication Sig Start Date End Date Taking? Authorizing Provider  aspirin 81 MG tablet Take 81 mg by mouth daily.   Yes [provider]  atorvastatin (LIPITOR) 20 MG tablet Take 1 tablet (20 mg total) by mouth daily. 06/17/20  Yes Daphine DeutscherMartin, Mary-Margaret, FNP  baclofen (LIORESAL) 10 MG tablet Take 1 tablet (10 mg total) by mouth 2 (two) times daily. 06/17/20  Yes Daphine DeutscherMartin, Mary-Margaret, FNP  buPROPion (WELLBUTRIN XL) 300 MG 24 hr tablet Take 1 tablet (300 mg total) by mouth daily. Needs appointment for further refills 09/16/20  Yes Daphine DeutscherMartin, Mary-Margaret, FNP  carbamazepine (TEGRETOL XR) 200 MG 12 hr tablet TAKE ONE CAPSULE BY MOUTH  TWICE A DAY Patient taking differently: Take 200 mg by mouth 2 (two) times daily. 12/06/20  Yes Martin, Mary-Margaret, FNP  cephALEXin (KEFLEX) 500 MG capsule Take 1 capsule (500 mg total) by mouth 4 (four) times daily. 01/16/21  Yes Bethann Berkshire, MD  fluconazole (DIFLUCAN) 150 MG tablet Take 150 mg by mouth once. 01/16/21  Yes [provider]  hydrOXYzine (VISTARIL) 25 MG capsule Take 1 capsule (25 mg total) by mouth 2 (two) times daily as needed. Patient taking differently: Take 25 mg by mouth 2 (two) times daily as needed for anxiety. 12/17/20  Yes Daphine Deutscher, Mary-Margaret, FNP  ibuprofen (ADVIL) 800 MG tablet Take 1 tablet (800 mg  total) by mouth every 8 (eight) hours as needed for moderate pain. 01/16/21  Yes Bethann Berkshire, MD  lisinopril (ZESTRIL) 20 MG tablet TAKE ONE (1) TABLET EACH DAY Patient taking differently: Take 20 mg by mouth daily. 11/21/20  Yes Daphine Deutscher, Mary-Margaret, FNP  ondansetron (ZOFRAN ODT) 4 MG disintegrating tablet 4mg  ODT q4 hours prn nausea/vomit 01/16/21  Yes 03/18/21, MD  pantoprazole (PROTONIX) 40 MG tablet Take 1 tablet (40 mg total) by mouth daily. 06/17/20  Yes Martin, Mary-Margaret, FNP  carbamazepine (CARBATROL) 200 MG 12 hr capsule TAKE ONE CAPSULE BY MOUTH TWICE A DAY Patient not taking: No sig reported 10/10/20   12/10/20, Mary-Margaret, FNP  fluticasone (FLONASE) 50 MCG/ACT nasal spray Place 1 spray into both nostrils 2 (two) times daily as needed for allergies or rhinitis. Patient not taking: No sig reported 07/29/20   Dettinger, 07/31/20, MD  venlafaxine XR (EFFEXOR XR) 37.5 MG 24 hr capsule Take 1 capsule (37.5 mg total) by mouth at bedtime. Patient not taking: No sig reported 09/16/20   11/16/20, FNP    Physical Exam: Vitals:   01/17/21 0730 01/17/21 0800 01/17/21 0912 01/17/21 0937  BP: 124/75 109/73 109/73 109/73  Pulse: 78 76 69 81  Resp:  18 16   Temp:      TempSrc:      SpO2: 96% 97% 92% 92%  Weight:      Height:        Constitutional: NAD, calm, comfortable Vitals:   01/17/21 0730 01/17/21 0800 01/17/21 0912 01/17/21 0937  BP: 124/75 109/73 109/73 109/73  Pulse: 78 76 69 81  Resp:  18 16   Temp:      TempSrc:      SpO2: 96% 97% 92% 92%  Weight:      Height:       Eyes: lids and conjunctivae normal Neck: normal, supple Respiratory: clear to auscultation bilaterally. Normal respiratory effort. No accessory muscle use.  Cardiovascular: Regular rate and rhythm, no murmurs. Abdomen: no tenderness, no distention. Bowel sounds positive.  Musculoskeletal:  No edema. Skin: no rashes, lesions, ulcers.  Psychiatric: Flat affect  Labs on Admission: I  have personally reviewed following labs and imaging studies  CBC: Recent Labs  Lab 01/16/21 0733 01/17/21 0750  WBC 9.3 7.9  NEUTROABS 8.0* 6.6  HGB 12.6 10.7*  HCT 37.1 32.1*  MCV 103.3* 102.9*  PLT 164 140*   Basic Metabolic Panel: Recent Labs  Lab 01/16/21 0733 01/17/21 0750  NA 134* 134*  K 3.3* 3.2*  CL 106 105  CO2 22 24  GLUCOSE 153* 119*  BUN 10 9  CREATININE 0.75 0.71  CALCIUM 8.4* 8.6*   GFR: Estimated Creatinine Clearance: 82.1 mL/min (by C-G formula based on SCr of 0.71 mg/dL). Liver Function Tests: Recent Labs  Lab 01/16/21 [phone]  01/17/21 0750  AST 17 17  ALT 8 7  ALKPHOS 92 85  BILITOT 1.1 0.8  PROT 6.8 6.0*  ALBUMIN 3.6 3.0*   No results for input(s): LIPASE, AMYLASE in the last 168 hours. No results for input(s): AMMONIA in the last 168 hours. Coagulation Profile: Recent Labs  Lab 01/16/21 0729  INR 1.0   Cardiac Enzymes: No results for input(s): CKTOTAL, CKMB, CKMBINDEX, TROPONINI in the last 168 hours. BNP (last 3 results) No results for input(s): PROBNP in the last 8760 hours. HbA1C: No results for input(s): HGBA1C in the last 72 hours. CBG: No results for input(s): GLUCAP in the last 168 hours. Lipid Profile: No results for input(s): CHOL, HDL, LDLCALC, TRIG, CHOLHDL, LDLDIRECT in the last 72 hours. Thyroid Function Tests: No results for input(s): TSH, T4TOTAL, FREET4, T3FREE, THYROIDAB in the last 72 hours. Anemia Panel: No results for input(s): VITAMINB12, FOLATE, FERRITIN, TIBC, IRON, RETICCTPCT in the last 72 hours. Urine analysis:    Component Value Date/Time   COLORURINE YELLOW 01/16/2021 0718   APPEARANCEUR HAZY (A) 01/16/2021 0718   APPEARANCEUR Clear 09/16/2020 1016   LABSPEC 1.010 01/16/2021 0718   PHURINE 7.0 01/16/2021 0718   GLUCOSEU NEGATIVE 01/16/2021 0718   HGBUR MODERATE (A) 01/16/2021 0718   BILIRUBINUR NEGATIVE 01/16/2021 0718   BILIRUBINUR Negative 09/16/2020 1016   KETONESUR 5 (A) 01/16/2021 0718    PROTEINUR 100 (A) 01/16/2021 0718   UROBILINOGEN negative 07/05/2015 1049   UROBILINOGEN 0.2 06/08/2007 0935   NITRITE NEGATIVE 01/16/2021 0718   LEUKOCYTESUR LARGE (A) 01/16/2021 0718    Radiological Exams on Admission: DG Chest Port 1 View  Result Date: 01/16/2021 CLINICAL DATA:  50 year old female with possible sepsis. EXAM: PORTABLE CHEST 1 VIEW COMPARISON:  Chest radiographs 06/08/2007. abdominal radiographs 04/07/2012, 04/29/2015 FINDINGS: Portable AP upright view at 0737 hours. Mild-to-moderate elevation of the left hemidiaphragm is increased since 2016. But there is no confluent left lung base opacity. Mediastinal contours remain normal. Visualized tracheal air column is within normal limits. Allowing for portable technique the lungs are clear. No pneumothorax or pleural effusion. Paucity of bowel gas in the upper abdomen. No acute osseous abnormality identified. IMPRESSION: Elevation of the left hemidiaphragm seems increased since 2016, but no acute cardiopulmonary abnormality is identified. And there is a paucity of bowel gas in the upper abdomen. Electronically Signed   By: Odessa Fleming M.D.   On: 01/16/2021 08:00   CT Renal Stone Study  Result Date: 01/17/2021 CLINICAL DATA:  Flank pain, kidney stone suspected. EXAM: CT ABDOMEN AND PELVIS WITHOUT CONTRAST TECHNIQUE: Multidetector CT imaging of the abdomen and pelvis was performed following the standard protocol without IV contrast. COMPARISON:  None. FINDINGS: Lower chest: Subsegmental atelectasis in the lingula and lower lobes, left greater than right. Hepatobiliary: No focal liver abnormality is seen. No gallstones, gallbladder wall thickening, or biliary dilatation. Pancreas: Diffuse mild parenchymal atrophy. No pancreatic ductal dilatation or surrounding inflammatory changes. Spleen: Normal in size without focal abnormality. Adrenals/Urinary Tract: Mild dilatation of the upper and middle thirds of the left ureter with mild periureteral fat  stranding. Left perinephric fat stranding is also noted. No perinephric fluid collection. No calcified stones seen within the left renal collecting system or urinary bladder. Congenital malrotation of the right kidney. No right hydronephrosis or renal stone. Stomach/Bowel: Stomach is within normal limits. Appendix appears normal. No evidence of bowel wall thickening, distention, or inflammatory changes. Diverticulosis of the descending and sigmoid colon without findings of diverticulitis. Vascular/Lymphatic: Mild-moderate calcific atherosclerosis  of the distal abdominal aorta and proximal common iliac arteries. The abdominal aorta is normal in caliber. IVC filter is in appropriate position. No enlarged abdominal or pelvic lymph nodes. Reproductive: Uterus and bilateral adnexa are unremarkable. Other: No abdominal wall hernia or abnormality. No abdominopelvic ascites. Musculoskeletal: No acute or significant osseous findings. IMPRESSION: 1. Mild dilatation the upper and middle thirds of the left ureter with mild perinephric and periureteral fat stranding, suggestive of recent stone passage. No calcified stone is seen within the left renal collecting system or urinary bladder. Of note, evaluation for pyelonephritis is limited without intravenous contrast. 2. Diverticulosis of the descending and sigmoid colon without findings of diverticulitis. 3. Mild-moderate calcific atherosclerosis of the distal abdominal aorta and proximal common iliac arteries. Aortic Atherosclerosis (ICD10-I70.0). Electronically Signed   By: Sherron Ales MD   On: 01/17/2021 09:33    EKG: Independently reviewed. SR 92bpm.  Assessment/Plan Active Problems:   Acute pyelonephritis    Acute pyelonephritis -Continue on Rocephin -Monitor blood and urine cultures -Zofran as needed for nausea or vomiting -IV Dilaudid as needed for severe pain -IV fluid  Microcytic anemia -Check B12 and folic acid levels -Monitor CBC with no overt  bleeding noted  Hypokalemia -Replete and reevaluate in a.m. -Check magnesium in a.m.  Mild thrombocytopenia -Continue to monitor CBC  History of stroke/dyslipidemia with prior seizures -Continue aspirin and statin -Continue home antiepileptics  History of hypertension -Continue home blood pressure medications and monitor  History of tobacco abuse -Counseled on cessation -Nicotine patch offered  DVT prophylaxis: Lovenox Code Status: Full Family Communication: None at bedside; pt will call Disposition Plan:Admit for IV abx and monitoring of cultures Consults called:None Admission status: Obs, MedSurg  Amyjo Mizrachi D Anntonette Madewell DO [facility]  If 7PM-7AM, please contact night-coverage [email or web address]  01/17/2021, 11:23 AM

## 2021-01-18 DIAGNOSIS — N10 Acute tubulo-interstitial nephritis: Secondary | ICD-10-CM | POA: Diagnosis not present

## 2021-01-18 LAB — BASIC METABOLIC PANEL
Anion gap: 7 (ref 5–15)
BUN: 9 mg/dL (ref 6–20)
CO2: 24 mmol/L (ref 22–32)
Calcium: 7.7 mg/dL — ABNORMAL LOW (ref 8.9–10.3)
Chloride: 105 mmol/L (ref 98–111)
Creatinine, Ser: 0.71 mg/dL (ref 0.44–1.00)
GFR, Estimated: >60 mL/min (ref >60)
Glucose, Bld: 102 mg/dL — ABNORMAL HIGH (ref 70–99)
Potassium: 3.3 mmol/L — ABNORMAL LOW (ref 3.5–5.1)
Sodium: 136 mmol/L (ref 135–145)

## 2021-01-18 LAB — CBC
HCT: 30.2 % — ABNORMAL LOW (ref 36.0–46.0)
Hemoglobin: 9.9 g/dL — ABNORMAL LOW (ref 12.0–15.0)
MCH: 35.1 pg — ABNORMAL HIGH (ref 26.0–34.0)
MCHC: 32.8 g/dL (ref 30.0–36.0)
MCV: 107.1 fL — ABNORMAL HIGH (ref 80.0–100.0)
Platelets: 121 10*3/uL — ABNORMAL LOW (ref 150–400)
RBC: 2.82 MIL/uL — ABNORMAL LOW (ref 3.87–5.11)
RDW: 12.3 % (ref 11.5–15.5)
WBC: 5.1 10*3/uL (ref 4.0–10.5)
nRBC: 0 % (ref 0.0–0.2)

## 2021-01-18 LAB — MAGNESIUM: Magnesium: 2 mg/dL (ref 1.7–2.4)

## 2021-01-18 MED ORDER — ONDANSETRON 4 MG PO TBDP
ORAL_TABLET | ORAL | 0 refills | Status: DC
Start: 2021-01-18 — End: 2021-02-03

## 2021-01-18 MED ORDER — TRAMADOL HCL 50 MG PO TABS: 50.0000 mg | ORAL_TABLET | Freq: Four times a day (QID) | ORAL | 0 refills | Status: DC | PRN

## 2021-01-18 MED ORDER — DOCUSATE SODIUM 100 MG PO CAPS: 100.0000 mg | ORAL_CAPSULE | Freq: Every day | ORAL | Status: DC

## 2021-01-18 MED ORDER — POTASSIUM CHLORIDE CRYS ER 20 MEQ PO TBCR
40.0000 meq | EXTENDED_RELEASE_TABLET | Freq: Once | ORAL | Status: AC
  Administered 2021-01-18: 40 meq via ORAL
  Filled 2021-01-18: qty 2

## 2021-01-18 NOTE — Discharge Summary (Signed)
Physician Discharge Summary  Cheryl Reeves WUJ:[id number] DOB: 06-22-1970 DOA: 01/17/2021  PCP: Bennie Pierini, FNP  Admit date: 01/17/2021  Discharge date: 01/18/2021  Admitted From: Home  Disposition:  Home  Recommendations for Outpatient Follow-up:  Follow up with PCP in 1-2 weeks Continue on Keflex as previously prescribed in ED and finish course of treatment Zofran prescribed as needed for nausea or vomiting Tramadol prescribed for 20 tablets and 0 refills as needed for severe pain Continue other home medications as prior  Home Health: None  Equipment/Devices: None  Discharge Condition:Stable  CODE STATUS: Full  Diet recommendation: Heart Healthy  Brief/Interim Summary: Per HPI: Cheryl Reeves is a 50 y.o. female with medical history significant for tobacco abuse, hypertension, dyslipidemia, prior stroke with seizure disorder, and DJD who presented to the ED initially on 8/11 with complaints of back pain and fever that was ongoing for couple days.  She was also noted to have some burning with urination and was diagnosed with pyelonephritis and sent home on Keflex as well as Diflucan.  She returns today with ongoing symptoms of fever and flank pain rated 7/10.  She has been given some IV fluid and Rocephin.  She continues to have some nausea but no vomiting.  -Patient was admitted with acute pyelonephritis that was not improving at home.  She was started on IV Rocephin empirically and has had no growth in her blood or urine cultures.  She is tolerating diet with minimal symptoms this morning aside from some back pain.  She denies any further nausea.  She appears stable for discharge and can resume taking her home oral medications to finish a course of treatment that was prescribed to her earlier.  No other acute events noted throughout the course of this admission and she is stable for discharge.  Discharge Diagnoses:  Active Problems:   Acute  pyelonephritis  Principal discharge diagnosis: Acute pyelonephritis.  Discharge Instructions  Discharge Instructions     Diet - low sodium heart healthy   Complete by: As directed    Increase activity slowly   Complete by: As directed       Allergies as of 01/18/2021       Reactions   Codeine    headaches        Medication List     STOP taking these medications    fluconazole 150 MG tablet Commonly known as: DIFLUCAN   fluticasone 50 MCG/ACT nasal spray Commonly known as: FLONASE       TAKE these medications    aspirin 81 MG tablet Take 81 mg by mouth daily.   atorvastatin 20 MG tablet Commonly known as: LIPITOR Take 1 tablet (20 mg total) by mouth daily.   baclofen 10 MG tablet Commonly known as: LIORESAL Take 1 tablet (10 mg total) by mouth 2 (two) times daily.   buPROPion 300 MG 24 hr tablet Commonly known as: WELLBUTRIN XL Take 1 tablet (300 mg total) by mouth daily. Needs appointment for further refills   carbamazepine 200 MG 12 hr tablet Commonly known as: TEGRETOL XR TAKE ONE CAPSULE BY MOUTH TWICE A DAY What changed:  how much to take Another medication with the same name was removed. Continue taking this medication, and follow the directions you see here.   cephALEXin 500 MG capsule Commonly known as: KEFLEX Take 1 capsule (500 mg total) by mouth 4 (four) times daily.   hydrOXYzine 25 MG capsule Commonly known as: VISTARIL Take 1 capsule (25 mg total)  by mouth 2 (two) times daily as needed. What changed: reasons to take this   ibuprofen 800 MG tablet Commonly known as: ADVIL Take 1 tablet (800 mg total) by mouth every 8 (eight) hours as needed for moderate pain.   lisinopril 20 MG tablet Commonly known as: ZESTRIL TAKE ONE (1) TABLET EACH DAY What changed: See the new instructions.   ondansetron 4 MG disintegrating tablet Commonly known as: Zofran ODT 4mg  ODT q4 hours prn nausea/vomit   pantoprazole 40 MG tablet Commonly  known as: PROTONIX Take 1 tablet (40 mg total) by mouth daily.   traMADol 50 MG tablet Commonly known as: Ultram Take 1 tablet (50 mg total) by mouth every 6 (six) hours as needed for severe pain.   venlafaxine XR 37.5 MG 24 hr capsule Commonly known as: Effexor XR Take 1 capsule (37.5 mg total) by mouth at bedtime.        Follow-up Information     Bennie PieriniMartin, Mary-Margaret, FNP. Schedule an appointment as soon as possible for a visit in 2 week(s).   Specialty: Family Medicine Contact information: [address] [place] [id number] [phone]                Allergies  Allergen Reactions   Codeine     headaches    Consultations: None   Procedures/Studies: DG Chest Port 1 View  Result Date: 01/16/2021 CLINICAL DATA:  50 year old female with possible sepsis. EXAM: PORTABLE CHEST 1 VIEW COMPARISON:  Chest radiographs 06/08/2007. abdominal radiographs 04/07/2012, 04/29/2015 FINDINGS: Portable AP upright view at 0737 hours. Mild-to-moderate elevation of the left hemidiaphragm is increased since 2016. But there is no confluent left lung base opacity. Mediastinal contours remain normal. Visualized tracheal air column is within normal limits. Allowing for portable technique the lungs are clear. No pneumothorax or pleural effusion. Paucity of bowel gas in the upper abdomen. No acute osseous abnormality identified. IMPRESSION: Elevation of the left hemidiaphragm seems increased since 2016, but no acute cardiopulmonary abnormality is identified. And there is a paucity of bowel gas in the upper abdomen. Electronically Signed   By: Odessa FlemingH  Hall M.D.   On: 01/16/2021 08:00   MM 3D SCREEN BREAST BILATERAL  Result Date: 01/03/2021 CLINICAL DATA:  Screening. EXAM: DIGITAL SCREENING BILATERAL MAMMOGRAM WITH TOMOSYNTHESIS AND CAD TECHNIQUE: Bilateral screening digital craniocaudal and mediolateral oblique mammograms were obtained. Bilateral screening digital breast tomosynthesis was  performed. The images were evaluated with computer-aided detection. COMPARISON:  Previous exam(s). ACR Breast Density Category b: There are scattered areas of fibroglandular density. FINDINGS: There are no findings suspicious for malignancy. IMPRESSION: No mammographic evidence of malignancy. A result letter of this screening mammogram will be mailed directly to the patient. RECOMMENDATION: Screening mammogram in one year. (Code:SM-B-01Y) BI-RADS CATEGORY  1: Negative. Electronically Signed   By: Bary RichardStan  Maynard M.D.   On: 01/03/2021 09:27   CT Renal Stone Study  Result Date: 01/17/2021 CLINICAL DATA:  Flank pain, kidney stone suspected. EXAM: CT ABDOMEN AND PELVIS WITHOUT CONTRAST TECHNIQUE: Multidetector CT imaging of the abdomen and pelvis was performed following the standard protocol without IV contrast. COMPARISON:  None. FINDINGS: Lower chest: Subsegmental atelectasis in the lingula and lower lobes, left greater than right. Hepatobiliary: No focal liver abnormality is seen. No gallstones, gallbladder wall thickening, or biliary dilatation. Pancreas: Diffuse mild parenchymal atrophy. No pancreatic ductal dilatation or surrounding inflammatory changes. Spleen: Normal in size without focal abnormality. Adrenals/Urinary Tract: Mild dilatation of the upper and middle thirds of the left ureter  with mild periureteral fat stranding. Left perinephric fat stranding is also noted. No perinephric fluid collection. No calcified stones seen within the left renal collecting system or urinary bladder. Congenital malrotation of the right kidney. No right hydronephrosis or renal stone. Stomach/Bowel: Stomach is within normal limits. Appendix appears normal. No evidence of bowel wall thickening, distention, or inflammatory changes. Diverticulosis of the descending and sigmoid colon without findings of diverticulitis. Vascular/Lymphatic: Mild-moderate calcific atherosclerosis of the distal abdominal aorta and proximal common  iliac arteries. The abdominal aorta is normal in caliber. IVC filter is in appropriate position. No enlarged abdominal or pelvic lymph nodes. Reproductive: Uterus and bilateral adnexa are unremarkable. Other: No abdominal wall hernia or abnormality. No abdominopelvic ascites. Musculoskeletal: No acute or significant osseous findings. IMPRESSION: 1. Mild dilatation the upper and middle thirds of the left ureter with mild perinephric and periureteral fat stranding, suggestive of recent stone passage. No calcified stone is seen within the left renal collecting system or urinary bladder. Of note, evaluation for pyelonephritis is limited without intravenous contrast. 2. Diverticulosis of the descending and sigmoid colon without findings of diverticulitis. 3. Mild-moderate calcific atherosclerosis of the distal abdominal aorta and proximal common iliac arteries. Aortic Atherosclerosis (ICD10-I70.0). Electronically Signed   By: Sherron Ales MD   On: 01/17/2021 09:33     Discharge Exam: Vitals:   01/17/21 2200 01/18/21 0554  BP: (!) 165/80 (!) 149/81  Pulse: (!) 102 68  Resp: 20 18  Temp: (!) 102.8 F (39.3 C) 97.6 F (36.4 C)  SpO2: 97% 100%   Vitals:   01/17/21 1530 01/17/21 1804 01/17/21 2200 01/18/21 0554  BP: (!) 159/76 (!) 146/84 (!) 165/80 (!) 149/81  Pulse: 96 94 (!) 102 68  Resp: 18 17 20 18   Temp: 99.2 F (37.3 C) 98.1 F (36.7 C) (!) 102.8 F (39.3 C) 97.6 F (36.4 C)  TempSrc: Oral Oral Oral Oral  SpO2: 97% 100% 97% 100%  Weight:      Height:        General: Pt is alert, awake, not in acute distress Cardiovascular: RRR, S1/S2 +, no rubs, no gallops Respiratory: CTA bilaterally, no wheezing, no rhonchi Abdominal: Soft, NT, ND, bowel sounds + Extremities: no edema, no cyanosis    The results of significant diagnostics from this hospitalization (including imaging, microbiology, ancillary and laboratory) are listed below for reference.     Microbiology: Recent Results (from  the past 240 hour(s))  Resp Panel by RT-PCR (Flu A&B, Covid) Nasopharyngeal Swab     Status: None   Collection Time: 01/16/21  7:29 AM   Specimen: Nasopharyngeal Swab; Nasopharyngeal(NP) swabs in vial transport medium  Result Value Ref Range Status   SARS Coronavirus 2 by RT PCR NEGATIVE NEGATIVE Final    Comment: (NOTE) SARS-CoV-2 target nucleic acids are NOT DETECTED.  The SARS-CoV-2 RNA is generally detectable in upper respiratory specimens during the acute phase of infection. The lowest concentration of SARS-CoV-2 viral copies this assay can detect is 138 copies/mL. A negative result does not preclude SARS-Cov-2 infection and should not be used as the sole basis for treatment or other patient management decisions. A negative result may occur with  improper specimen collection/handling, submission of specimen other than nasopharyngeal swab, presence of viral mutation(s) within the areas targeted by this assay, and inadequate number of viral copies(<138 copies/mL). A negative result must be combined with clinical observations, patient history, and epidemiological information. The expected result is Negative.  Fact Sheet for Patients:  03/18/21  Fact  Sheet for Healthcare Providers:  SeriousBroker.it  This test is no t yet approved or cleared by the [place] FDA and  has been authorized for detection and/or diagnosis of SARS-CoV-2 by FDA under an Emergency Use Authorization (EUA). This EUA will remain  in effect (meaning this test can be used) for the duration of the COVID-19 declaration under Section 564(b)(1) of the Act, 21 U.S.C.section 360bbb-3(b)(1), unless the authorization is terminated  or revoked sooner.       Influenza A by PCR NEGATIVE NEGATIVE Final   Influenza B by PCR NEGATIVE NEGATIVE Final    Comment: (NOTE) The Xpert Xpress SARS-CoV-2/FLU/RSV plus assay is intended as an aid in the diagnosis of  influenza from Nasopharyngeal swab specimens and should not be used as a sole basis for treatment. Nasal washings and aspirates are unacceptable for Xpert Xpress SARS-CoV-2/FLU/RSV testing.  Fact Sheet for Patients: BloggerCourse.com  Fact Sheet for Healthcare Providers: SeriousBroker.it  This test is not yet approved or cleared by the [place] FDA and has been authorized for detection and/or diagnosis of SARS-CoV-2 by FDA under an Emergency Use Authorization (EUA). This EUA will remain in effect (meaning this test can be used) for the duration of the COVID-19 declaration under Section 564(b)(1) of the Act, 21 U.S.C. section 360bbb-3(b)(1), unless the authorization is terminated or revoked.  Performed at [facility], [address], [place], [place] [address]   Blood Culture (routine x 2)     Status: None (Preliminary result)   Collection Time: 01/16/21  7:29 AM   Specimen: BLOOD  Result Value Ref Range Status   Specimen Description BLOOD LEFT WRIST  Final   Special Requests   Final    BOTTLES DRAWN AEROBIC AND ANAEROBIC Blood Culture adequate volume   Culture   Final    NO GROWTH 2 DAYS Performed at [facility], [address] [place], [place] [address]    Report Status PENDING  Incomplete  Urine Culture     Status: Abnormal (Preliminary result)   Collection Time: 01/16/21  7:29 AM   Specimen: Urine, Random  Result Value Ref Range Status   Specimen Description   Final    URINE, RANDOM Performed at [facility], [address], [place], [place] [address]    Special Requests   Final    NONE Performed at [facility], [address], [place], [place] [address]    Culture (A)  Final    70,000 COLONIES/mL ESCHERICHIA COLI SUSCEPTIBILITIES TO FOLLOW Performed at [facility], [address], [place], [place] [address]    Report Status PENDING  Incomplete  Blood Culture (routine x 2)     Status: None (Preliminary  result)   Collection Time: 01/16/21  7:34 AM   Specimen: BLOOD  Result Value Ref Range Status   Specimen Description BLOOD RIGHT ANTECUBITAL  Final   Special Requests   Final    BOTTLES DRAWN AEROBIC AND ANAEROBIC Blood Culture adequate volume   Culture   Final    NO GROWTH 2 DAYS Performed at [facility], [address] [place], [place] [address]    Report Status PENDING  Incomplete  Blood culture (routine x 2)     Status: None (Preliminary result)   Collection Time: 01/17/21  7:39 AM   Specimen: Left Antecubital; Blood  Result Value Ref Range Status   Specimen Description LEFT ANTECUBITAL  Final   Special Requests   Final    BOTTLES DRAWN AEROBIC AND ANAEROBIC Blood Culture  adequate volume   Culture   Final    NO GROWTH < 24 HOURS Performed at [facility], [address], [place], [place] [address]    Report Status PENDING  Incomplete  Blood culture (routine x 2)     Status: None (Preliminary result)   Collection Time: 01/17/21  7:50 AM   Specimen: BLOOD RIGHT FOREARM  Result Value Ref Range Status   Specimen Description BLOOD RIGHT FOREARM  Final   Special Requests   Final    BOTTLES DRAWN AEROBIC AND ANAEROBIC Blood Culture adequate volume   Culture   Final    NO GROWTH < 24 HOURS Performed at [facility], [address] [place], [place] [address]    Report Status PENDING  Incomplete     Labs: BNP (last 3 results) No results for input(s): BNP in the last 8760 hours. Basic Metabolic Panel: Recent Labs  Lab 01/16/21 0733 01/17/21 0750 01/17/21 1143 01/18/21 0538  NA 134* 134*  --  136  K 3.3* 3.2*  --  3.3*  CL 106 105  --  105  CO2 22 24  --  24  GLUCOSE 153* 119*  --  102*  BUN 10 9  --  9  CREATININE 0.75 0.71 0.85 0.71  CALCIUM 8.4* 8.6*  --  7.7*   Liver Function Tests: Recent Labs  Lab 01/16/21 0733 01/17/21 0750  AST 17 17  ALT 8 7  ALKPHOS 92 85  BILITOT 1.1 0.8  PROT 6.8 6.0*  ALBUMIN 3.6 3.0*   No results for input(s): LIPASE, AMYLASE  in the last 168 hours. No results for input(s): AMMONIA in the last 168 hours. CBC: Recent Labs  Lab 01/16/21 0733 01/17/21 0750 01/17/21 1143 01/18/21 0538  WBC 9.3 7.9 7.1 5.1  NEUTROABS 8.0* 6.6  --   --   HGB 12.6 10.7* 11.3* 9.9*  HCT 37.1 32.1* 34.5* 30.2*  MCV 103.3* 102.9* 106.2* 107.1*  PLT 164 140* 128* 121*   Cardiac Enzymes: No results for input(s): CKTOTAL, CKMB, CKMBINDEX, TROPONINI in the last 168 hours. BNP: Invalid input(s): POCBNP CBG: No results for input(s): GLUCAP in the last 168 hours. D-Dimer No results for input(s): DDIMER in the last 72 hours. Hgb A1c No results for input(s): HGBA1C in the last 72 hours. Lipid Profile No results for input(s): CHOL, HDL, LDLCALC, TRIG, CHOLHDL, LDLDIRECT in the last 72 hours. Thyroid function studies No results for input(s): TSH, T4TOTAL, T3FREE, THYROIDAB in the last 72 hours.  Invalid input(s): FREET3 Anemia work up No results for input(s): VITAMINB12, FOLATE, FERRITIN, TIBC, IRON, RETICCTPCT in the last 72 hours. Urinalysis    Component Value Date/Time   COLORURINE YELLOW 01/16/2021 0718   APPEARANCEUR HAZY (A) 01/16/2021 0718   APPEARANCEUR Clear 09/16/2020 1016   LABSPEC 1.010 01/16/2021 0718   PHURINE 7.0 01/16/2021 0718   GLUCOSEU NEGATIVE 01/16/2021 0718   HGBUR MODERATE (A) 01/16/2021 0718   BILIRUBINUR NEGATIVE 01/16/2021 0718   BILIRUBINUR Negative 09/16/2020 1016   KETONESUR 5 (A) 01/16/2021 0718   PROTEINUR 100 (A) 01/16/2021 0718   UROBILINOGEN negative 07/05/2015 1049   UROBILINOGEN 0.2 06/08/2007 0935   NITRITE NEGATIVE 01/16/2021 0718   LEUKOCYTESUR LARGE (A) 01/16/2021 0718   Sepsis Labs Invalid input(s): PROCALCITONIN,  WBC,  LACTICIDVEN Microbiology Recent Results (from the past 240 hour(s))  Resp Panel by RT-PCR (Flu A&B, Covid) Nasopharyngeal Swab     Status: None   Collection Time: 01/16/21  7:29 AM   Specimen: Nasopharyngeal Swab;  Nasopharyngeal(NP) swabs in vial transport  medium  Result Value Ref Range Status   SARS Coronavirus 2 by RT PCR NEGATIVE NEGATIVE Final    Comment: (NOTE) SARS-CoV-2 target nucleic acids are NOT DETECTED.  The SARS-CoV-2 RNA is generally detectable in upper respiratory specimens during the acute phase of infection. The lowest concentration of SARS-CoV-2 viral copies this assay can detect is 138 copies/mL. A negative result does not preclude SARS-Cov-2 infection and should not be used as the sole basis for treatment or other patient management decisions. A negative result may occur with  improper specimen collection/handling, submission of specimen other than nasopharyngeal swab, presence of viral mutation(s) within the areas targeted by this assay, and inadequate number of viral copies(<138 copies/mL). A negative result must be combined with clinical observations, patient history, and epidemiological information. The expected result is Negative.  Fact Sheet for Patients:  BloggerCourse.com  Fact Sheet for Healthcare Providers:  SeriousBroker.it  This test is no t yet approved or cleared by the [place] FDA and  has been authorized for detection and/or diagnosis of SARS-CoV-2 by FDA under an Emergency Use Authorization (EUA). This EUA will remain  in effect (meaning this test can be used) for the duration of the COVID-19 declaration under Section 564(b)(1) of the Act, 21 U.S.C.section 360bbb-3(b)(1), unless the authorization is terminated  or revoked sooner.       Influenza A by PCR NEGATIVE NEGATIVE Final   Influenza B by PCR NEGATIVE NEGATIVE Final    Comment: (NOTE) The Xpert Xpress SARS-CoV-2/FLU/RSV plus assay is intended as an aid in the diagnosis of influenza from Nasopharyngeal swab specimens and should not be used as a sole basis for treatment. Nasal washings and aspirates are unacceptable for Xpert Xpress SARS-CoV-2/FLU/RSV testing.  Fact Sheet for  Patients: BloggerCourse.com  Fact Sheet for Healthcare Providers: SeriousBroker.it  This test is not yet approved or cleared by the [place] FDA and has been authorized for detection and/or diagnosis of SARS-CoV-2 by FDA under an Emergency Use Authorization (EUA). This EUA will remain in effect (meaning this test can be used) for the duration of the COVID-19 declaration under Section 564(b)(1) of the Act, 21 U.S.C. section 360bbb-3(b)(1), unless the authorization is terminated or revoked.  Performed at [facility], [address], [place], [place] [address]   Blood Culture (routine x 2)     Status: None (Preliminary result)   Collection Time: 01/16/21  7:29 AM   Specimen: BLOOD  Result Value Ref Range Status   Specimen Description BLOOD LEFT WRIST  Final   Special Requests   Final    BOTTLES DRAWN AEROBIC AND ANAEROBIC Blood Culture adequate volume   Culture   Final    NO GROWTH 2 DAYS Performed at [facility], [address], [place], [place] [address]    Report Status PENDING  Incomplete  Urine Culture     Status: Abnormal (Preliminary result)   Collection Time: 01/16/21  7:29 AM   Specimen: Urine, Random  Result Value Ref Range Status   Specimen Description   Final    URINE, RANDOM Performed at [facility], [address] [place], [place] [address]    Special Requests   Final    NONE Performed at [facility], [address] [place], [place] [address]    Culture (A)  Final    70,000 COLONIES/mL ESCHERICHIA COLI SUSCEPTIBILITIES TO FOLLOW Performed at [facility], [address], [place], [place] [address]    Report Status PENDING  Incomplete  Blood Culture (routine x 2)     Status: None (Preliminary result)   Collection Time: 01/16/21  7:34 AM   Specimen: BLOOD  Result Value Ref Range Status   Specimen Description BLOOD RIGHT ANTECUBITAL  Final   Special Requests   Final    BOTTLES DRAWN AEROBIC AND  ANAEROBIC Blood Culture adequate volume   Culture   Final    NO GROWTH 2 DAYS Performed at [facility], [address], [place], [place] [address]    Report Status PENDING  Incomplete  Blood culture (routine x 2)     Status: None (Preliminary result)   Collection Time: 01/17/21  7:39 AM   Specimen: Left Antecubital; Blood  Result Value Ref Range Status   Specimen Description LEFT ANTECUBITAL  Final   Special Requests   Final    BOTTLES DRAWN AEROBIC AND ANAEROBIC Blood Culture adequate volume   Culture   Final    NO GROWTH < 24 HOURS Performed at [facility], [address] [place], [place] [address]    Report Status PENDING  Incomplete  Blood culture (routine x 2)     Status: None (Preliminary result)   Collection Time: 01/17/21  7:50 AM   Specimen: BLOOD RIGHT FOREARM  Result Value Ref Range Status   Specimen Description BLOOD RIGHT FOREARM  Final   Special Requests   Final    BOTTLES DRAWN AEROBIC AND ANAEROBIC Blood Culture adequate volume   Culture   Final    NO GROWTH < 24 HOURS Performed at [facility], [address] [place], [place] [address]    Report Status PENDING  Incomplete     Time coordinating discharge: 35 minutes  SIGNED:   Erick Blinks, DO [facility] 01/18/2021, 9:06 AM  If 7PM-7AM, please contact night-coverage [email or web address]

## 2021-01-19 LAB — URINE CULTURE: Culture: 70000 — AB

## 2021-01-20 ENCOUNTER — Telehealth: Payer: Self-pay

## 2021-01-20 NOTE — Telephone Encounter (Signed)
Post ED Visit - Positive Culture Follow-up  Culture report reviewed by antimicrobial stewardship pharmacist: [facility] [x]  , Pharm.D. []  Audie Clear, Pharm.D., BCPS AQ-ID []  , Pharm.D., BCPS []  Celedonio Miyamoto, Pharm.D., BCPS []  Sherman, Garvin Fila.D., BCPS, AAHIVP []  , Pharm.D., BCPS, AAHIVP []  Georgina Pillion, PharmD, BCPS []  , PharmD, BCPS []  [place], PharmD, BCPS []  [address], PharmD []  , PharmD, BCPS []  Estella Husk, PharmD  Pharmacy Team []  Lysle Pearl, PharmD []  , PharmD []  Phillips Climes, PharmD []  , Rph []  Agapito Games) , PharmD []  Verlan Friends, PharmD []  , PharmD []  Mervyn Gay, PharmD []  , PharmD []  Vinnie Level, PharmD []  [facility], PharmD []  , PharmD []  Len Childs, PharmD   Positive urine culture Treated with Cephalexin and Fluconazole, organism sensitive to the same and no further patient follow-up is required at this time.  01/20/2021, 11:31 AM

## 2021-01-21 ENCOUNTER — Telehealth: Payer: Self-pay

## 2021-01-21 LAB — CULTURE, BLOOD (ROUTINE X 2)
Culture: NO GROWTH
Culture: NO GROWTH
Special Requests: ADEQUATE
Special Requests: ADEQUATE

## 2021-01-21 NOTE — Telephone Encounter (Signed)
Transition Care Management Follow-up Telephone Call Date of discharge and from where: [facility] 01/18/21 Diagnosis: acute pyelonephritis How have you been since you were released from the hospital? Weak and tired, but feeling better overall Any questions or concerns? No  Items Reviewed: Did the pt receive and understand the discharge instructions provided? Yes  Medications obtained and verified? Yes  Other? No  Any new allergies since your discharge? No  Dietary orders reviewed? Yes Do you have support at home? Yes   Home Care and Equipment/Supplies: Were home health services ordered? no If so, what is the name of the agency? N/a  Has the agency set up a time to come to the patient's home? not applicable Were any new equipment or medical supplies ordered?  No What is the name of the medical supply agency? N/a Were you able to get the supplies/equipment? not applicable Do you have any questions related to the use of the equipment or supplies? No  Functional Questionnaire: (I = Independent and D = Dependent) ADLs: I  Bathing/Dressing- I  Meal Prep- I  Eating- I  Maintaining continence- I  Transferring/Ambulation- I  Managing Meds- I  Follow up appointments reviewed:  PCP Hospital f/u appt confirmed? Yes  Scheduled to see [name]  on 8/22 @ 2. Specialist Hospital f/u appt confirmed? No   Are transportation arrangements needed? No  If their condition worsens, is the pt aware to call PCP or go to the Emergency Dept.? Yes Was the patient provided with contact information for the PCP's office or ED? Yes Was to pt encouraged to call back with questions or concerns? Yes

## 2021-01-22 LAB — CULTURE, BLOOD (ROUTINE X 2)
Culture: NO GROWTH
Culture: NO GROWTH
Special Requests: ADEQUATE
Special Requests: ADEQUATE

## 2021-01-27 ENCOUNTER — Encounter: Payer: Self-pay | Admitting: Nurse Practitioner

## 2021-01-27 ENCOUNTER — Ambulatory Visit: Payer: PPO | Admitting: Neurology

## 2021-01-27 ENCOUNTER — Encounter: Payer: Self-pay | Admitting: Neurology

## 2021-01-27 ENCOUNTER — Other Ambulatory Visit: Payer: Self-pay

## 2021-01-27 ENCOUNTER — Ambulatory Visit (INDEPENDENT_AMBULATORY_CARE_PROVIDER_SITE_OTHER): Payer: PPO | Admitting: Nurse Practitioner

## 2021-01-27 ENCOUNTER — Encounter: Payer: Self-pay | Admitting: *Deleted

## 2021-01-27 VITALS — BP 118/84 | HR 79 | Ht 65.0 in | Wt 153.5 lb

## 2021-01-27 VITALS — BP 187/106 | HR 71 | Temp 97.6°F | Resp 20 | Ht 65.0 in | Wt 154.0 lb

## 2021-01-27 DIAGNOSIS — G8111 Spastic hemiplegia affecting right dominant side: Secondary | ICD-10-CM | POA: Insufficient documentation

## 2021-01-27 DIAGNOSIS — I639 Cerebral infarction, unspecified: Secondary | ICD-10-CM | POA: Diagnosis not present

## 2021-01-27 DIAGNOSIS — R569 Unspecified convulsions: Secondary | ICD-10-CM | POA: Diagnosis not present

## 2021-01-27 DIAGNOSIS — R252 Cramp and spasm: Secondary | ICD-10-CM | POA: Diagnosis not present

## 2021-01-27 DIAGNOSIS — N10 Acute tubulo-interstitial nephritis: Secondary | ICD-10-CM | POA: Diagnosis not present

## 2021-01-27 DIAGNOSIS — G43709 Chronic migraine without aura, not intractable, without status migrainosus: Secondary | ICD-10-CM | POA: Insufficient documentation

## 2021-01-27 DIAGNOSIS — Z87440 Personal history of urinary (tract) infections: Secondary | ICD-10-CM

## 2021-01-27 LAB — URINALYSIS, COMPLETE
Bilirubin, UA: NEGATIVE
Glucose, UA: NEGATIVE
Ketones, UA: NEGATIVE
Nitrite, UA: NEGATIVE
Protein,UA: NEGATIVE
RBC, UA: NEGATIVE
Specific Gravity, UA: 1.015 (ref 1.005–1.030)
Urobilinogen, Ur: 0.2 mg/dL (ref 0.2–1.0)
pH, UA: 6.5 (ref 5.0–7.5)

## 2021-01-27 LAB — MICROSCOPIC EXAMINATION
RBC, Urine: NONE SEEN /hpf (ref 0–2)
Renal Epithel, UA: NONE SEEN /hpf

## 2021-01-27 MED ORDER — ZONISAMIDE 100 MG PO CAPS: 100.0000 mg | ORAL_CAPSULE | Freq: Every day | ORAL | 11 refills | Status: DC

## 2021-01-27 MED ORDER — ZONISAMIDE 100 MG PO CAPS: 200.0000 mg | ORAL_CAPSULE | Freq: Every day | ORAL | 11 refills | Status: DC

## 2021-01-27 MED ORDER — ONDANSETRON 4 MG PO TBDP: 4.0000 mg | ORAL_TABLET | Freq: Three times a day (TID) | ORAL | 6 refills | Status: DC | PRN

## 2021-01-27 MED ORDER — BUTALBITAL-APAP-CAFFEINE 50-325-40 MG PO TABS: 1.0000 | ORAL_TABLET | Freq: Four times a day (QID) | ORAL | 5 refills | Status: DC | PRN

## 2021-01-27 NOTE — Progress Notes (Signed)
Subjective:    Patient ID: Cheryl Reeves, female    DOB: 28-Jul-1970, 50 y.o.   MRN: [id number]   Chief Complaint: TCM  HPI  Today's visit was for Transitional Care Management.  The patient was discharged from [facility] on 01/18/21 with a primary diagnosis of acute pyelonephritis.   Contact with the patient and/or caregiver, by a clinical staff member, was made on 01/21/21 and was documented as a telephone encounter within the EMR.  Through chart review and discussion with the patient I have determined that management of their condition is of low complexity.    Patient was in hospital with acute pylenephritis. She was given IV keflex in hospital and was discharged home on oral keflex. She is here today to make sure urine is clear. All symptoms have resolved. She still has a little lower back pain.    Review of Systems  Constitutional:  Negative for diaphoresis.  Eyes:  Negative for pain.  Respiratory:  Negative for shortness of breath.   Cardiovascular:  Negative for chest pain, palpitations and leg swelling.  Gastrointestinal:  Negative for abdominal pain.  Endocrine: Negative for polydipsia.  Skin:  Negative for rash.  Neurological:  Negative for dizziness, weakness and headaches.  Hematological:  Does not bruise/bleed easily.  All other systems reviewed and are negative.     Objective:   Physical Exam Vitals and nursing note reviewed.  Constitutional:      General: She is not in acute distress.    Appearance: Normal appearance. She is well-developed.  Neck:     Vascular: No carotid bruit or JVD.  Cardiovascular:     Rate and Rhythm: Normal rate and regular rhythm.     Heart sounds: Normal heart sounds.  Pulmonary:     Effort: Pulmonary effort is normal. No respiratory distress.     Breath sounds: Normal breath sounds. No wheezing or rales.  Chest:     Chest wall: No tenderness.  Abdominal:     General: Bowel sounds are normal. There is no distension or  abdominal bruit.     Palpations: Abdomen is soft. There is no hepatomegaly, splenomegaly, mass or pulsatile mass.     Tenderness: There is no abdominal tenderness. There is no right CVA tenderness or left CVA tenderness.  Musculoskeletal:        General: Normal range of motion.  Lymphadenopathy:     Cervical: No cervical adenopathy.  Skin:    General: Skin is warm and dry.  Neurological:     Mental Status: She is alert and oriented to person, place, and time.     Deep Tendon Reflexes: Reflexes are normal and symmetric.  Psychiatric:        Behavior: Behavior normal.        Thought Content: Thought content normal.        Judgment: Judgment normal.    BP (!) 187/106   Pulse 71   Temp 97.6 F (36.4 C) (Temporal)   Resp 20   Ht 5\' 5"  (1.651 m)   Wt 154 lb (69.9 kg)   SpO2 99%   BMI 25.63 kg/m         Assessment & Plan:   Cheryl Reeves in today with chief complaint of Transitions Of Care   1. Recent urinary tract infection Will run culture on urine just to make sure cleared - Urinalysis, Complete - Urine Culture  2. [facility] records reviewed  The above assessment and management plan was discussed  with the patient. The patient verbalized understanding of and has agreed to the management plan. Patient is aware to call the clinic if symptoms persist or worsen. Patient is aware when to return to the clinic for a follow-up visit. Patient educated on when it is appropriate to go to the emergency department.   Mary-Margaret Hassell Done, FNP

## 2021-01-27 NOTE — Patient Instructions (Addendum)
Stop, carbamazepine, replaced it with zonisamide 100 mg every night   Start  Meds ordered this encounter  Limit the use to less than twice each week   butalbital-acetaminophen-caffeine (FIORICET) 50-325-40 MG tablet    Sig: Take 1 tablet by mouth every 6 (six) hours as needed for headache.    Dispense:  12 tablet    Refill:  5   zonisamide (ZONEGRAN) 100 MG capsule    Sig: Take 1 capsule (100 mg total) by mouth daily.    Dispense:  30 capsule    Refill:  11   ondansetron (ZOFRAN ODT) 4 MG disintegrating tablet    Sig: Take 1 tablet (4 mg total) by mouth every 8 (eight) hours as needed for nausea or vomiting.    Dispense:  20 tablet    Refill:  6     Migraine headache You may mix Fioricet with zofran, aleve, even one extra baclofen for prolonged severe migraine.  Stop Claritin D

## 2021-01-27 NOTE — Progress Notes (Signed)
Chief Complaint  Patient presents with   Consult    Room 16 - alone. Reports worsening spasticity in right leg. She has been going to PT which has helped some. Hx of CVA. No recent seizure activity. Migraines are worse. She estimates 2-3 headache days each week. Venlafaxine did not work well. Bernita Raisin was too expensive. She is now using ibuprofen for pain relief.       ASSESSMENT AND PLAN  Cheryl Reeves is a 50 y.o. female   History of stroke in 1999, with residual right hemiparesis,  She complains of increased right lower extremity spasticity, frequent spastic right ankle plantarflexion  Preauthorization for Xeomin injection, ask for 300 units,  Is on baclofen 10 mg twice a day  Seizure  1 seizure was during her stroke, recurrent seizure couple years later, has been on stable dose of carbamazepine 200 mg twice a day, will change to Zonegran 200 mg every night to simplify the regimen, and also benefit for migraine  Chronic migraine  Zonegran 200 mg every night as preventive medication  Not a good candidate for triptan, tried Bernita Raisin of 50 mg multiple times does not help, currently frequent use of over-the-counter medication including Claritin-D, I have suggested to stop, which has Sudafed, vasoconstriction component  Try Fioricet as needed, limit the use to less than twice each week, may combine with Zofran, Aleve, even baclofen for severe prolonged headaches  DIAGNOSTIC DATA (LABS, IMAGING, TESTING) - I reviewed patient records, labs, notes, testing and imaging myself where available.   MEDICAL HISTORY:  Cheryl Reeves, seen in request by   Bennie Pierini, FNP [address] [place],  [place] [address], Bennie Pierini, FNP   I reviewed and summarized the referring note.  April 2021 Cheryl Reeves is a 50 years old right-handed female, seen in refer by her primary care physician Dr. Rudi Heap for evaluation of frequent headaches, last visit was with  Dr. Hosie Poisson in 2015   She suffered stroke in 1999, was attributed to post partum hemorrhage, with mild aphasia, mild right upper extremity weakness, spastic gait right lower extremity weakness, she also had one seizure when she had a stroke, recurrent seizure in 2001, taking Tegretol 200 mg twice a day   She has frequent headaches even before she had stroke, contributed her sinus headache, had severe headache the day she had stroke, and persistent almost daily headache ever since, since 2014, she has moderate sinus pressure headaches, has been taking daily ibuprofen 10-12 tablets each day, has tried different over-the-counter sinus medications without helping her symptoms.   She never tried preventive medications in the past, she complains of excessive stress, her son just moved out from her house to live with her ex-husband, has difficulty sleeping, is taking Wellbutrin 150 mg every morning I have personally reviewed MRI of the brain in 2007, left frontal encephalomalacia   UPDATE Feb 25 2015:She only take one nortirptyline 25mg  qhs, instead of 2 tablets every night, which has been helpful, she no longer has constant headaches, but she has to take ibuprofen up to 5 tablets each time for her right side low back pain, she complains of right hip, low back pain, related to her abnormal gait, she denies shooting pain to her right lower extremity   UPDATE September 19 2019: She has lost follow-up since last visit in June 2018, she came back complains of worsening headaches, up to 5 days each week, reviewing previous record nortriptyline up to 50 mg every  night has been helpful,   She thought her migraine headache is sinus related, over the years, she has self medicated with over-the-counter sinus medications including daily Sudafed, multiple dose, Claritin, Mucinex sinus, Alka-Seltzer, ibuprofen up to 15 tablets daily, she complains of daily severe headaches, not function well because of significant headaches,  holocranial, pressure, behind eye pain, light noise sensitivity   I personally reviewed CT head without contrast in March 2019 showed stable left frontal encephalomalacia and porencephaly, no acute intracranial abnormality CT of cervical spine showed multilevel degenerative disc disease,   MRI of brain in 2007:  No acute intracranial abnormality.  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     Laboratory evaluation in 2020,  elevated LDL 114, cholesterol 220, CMP showed mild decrease sodium 127,   UPDATE January 27 2021: Today she returns to hold receive Botox injection for increased to right lower extremity spasticity, tendency for right ankle with plantarflexion, tried physical therapy, helped some  She still complains a lot of headache about twice a week, lasting all day, she take frequent Claritin-D, and ibuprofen, which only provide partial help, recently tried [place] as needed was not helping her headache  Was not sure the benefit of Effexor XR 37.5 mg, she continued Tegretol-XR 200 mg twice a day as seizure prevention, has not had seizure for a long time,  PHYSICAL EXAM:   Vitals:   01/27/21 0946  BP: 118/84  Pulse: 79  Weight: 153 lb 8 oz (69.6 kg)  Height: 5\' 5"  (1.651 m)   Not recorded     Body mass index is 25.54 kg/m.  PHYSICAL EXAMNIATION:  Gen: NAD, conversant, well nourised, well groomed                     Cardiovascular: Regular rate rhythm, no peripheral edema, warm, nontender. Eyes: Conjunctivae clear without exudates or hemorrhage Neck: Supple, no carotid bruits. Pulmonary: Clear to auscultation bilaterally   NEUROLOGICAL EXAM:  MENTAL STATUS: Speech:    Speech is normal; fluent and spontaneous with normal comprehension.  Cognition:     Orientation to time, place and person     Normal recent and remote memory     Normal Attention span and concentration     Normal Language, naming,  repeating,spontaneous speech     Fund of knowledge   CRANIAL NERVES: CN II: Visual fields are full to confrontation. Pupils are round equal and briskly reactive to light. CN III, IV, VI: extraocular movement are normal. No ptosis. CN V: Facial sensation is intact to light touch CN VII: Face is symmetric with normal eye closure  CN VIII: Hearing is normal to causal conversation. CN IX, X: Phonation is normal. CN XI: Head turning and shoulder shrug are intact  MOTOR: Spastic right hemiparesis, fixation of right upper extremity upon rapid rotating movement, mild right hip flexion weakness, moderate right ankle dorsiflexion weakness, wearing right ankle brace, tendency for right ankle plantarflexion, also almost constant right toe extension  REFLEXES: Hyperreflexia of right upper and lower extremity, right Babinski sign,  SENSORY: Intact to light touch, pinprick and vibratory sensation are intact in fingers and toes.  COORDINATION: There is no trunk or limb dysmetria noted.  GAIT/STANCE: Need push-up to get up from seated position, spastic right hemicircumferential unsteady gait tendency for right ankle plantarflexion  REVIEW OF SYSTEMS:  Full 14 system review of systems performed and notable only for as above All other review of systems were  negative.   ALLERGIES: Allergies  Allergen Reactions   Codeine     headaches    HOME MEDICATIONS: Current Outpatient Medications  Medication Sig Dispense Refill   aspirin 81 MG tablet Take 81 mg by mouth daily.     atorvastatin (LIPITOR) 20 MG tablet Take 1 tablet (20 mg total) by mouth daily. 90 tablet 1   baclofen (LIORESAL) 10 MG tablet Take 1 tablet (10 mg total) by mouth 2 (two) times daily. 180 each 1   buPROPion (WELLBUTRIN XL) 300 MG 24 hr tablet Take 1 tablet (300 mg total) by mouth daily. Needs appointment for further refills 90 tablet 1   carbamazepine (TEGRETOL XR) 200 MG 12 hr tablet TAKE ONE CAPSULE BY MOUTH TWICE A DAY  (Patient taking differently: Take 200 mg by mouth 2 (two) times daily.) 60 tablet 0   cephALEXin (KEFLEX) 500 MG capsule Take 1 capsule (500 mg total) by mouth 4 (four) times daily. 40 capsule 0   hydrOXYzine (VISTARIL) 25 MG capsule Take 1 capsule (25 mg total) by mouth 2 (two) times daily as needed. 60 capsule 2   ibuprofen (ADVIL) 800 MG tablet Take 1 tablet (800 mg total) by mouth every 8 (eight) hours as needed for moderate pain. 21 tablet 0   lisinopril (ZESTRIL) 20 MG tablet TAKE ONE (1) TABLET EACH DAY (Patient taking differently: Take 20 mg by mouth daily.) 90 tablet 1   ondansetron (ZOFRAN ODT) 4 MG disintegrating tablet 4mg  ODT q4 hours prn nausea/vomit 12 tablet 0   pantoprazole (PROTONIX) 40 MG tablet Take 1 tablet (40 mg total) by mouth daily. 90 tablet 1   traMADol (ULTRAM) 50 MG tablet Take 1 tablet (50 mg total) by mouth every 6 (six) hours as needed for severe pain. 20 tablet 0   No current facility-administered medications for this visit.    PAST MEDICAL HISTORY: Past Medical History:  Diagnosis Date   Allergy    DJD (degenerative joint disease)    GERD (gastroesophageal reflux disease)    Headache    Stroke (HCC) 1999   S/p childbirth-weakness rt leg   Thoracic injuries    Wears glasses     PAST SURGICAL HISTORY: Past Surgical History:  Procedure Laterality Date   ARTHROSCOPY KNEE W/ DRILLING  2008   rt   REPLACEMENT TOTAL KNEE Right 2009   SEPTOPLASTY Bilateral 07/21/2013   Procedure: SEPTOPLASTY;  Surgeon: Suzanna ObeyJohn Byers, MD;  Location: [facility];  Service: ENT;  Laterality: Bilateral;   SINUS ENDO W/FUSION Bilateral 07/21/2013   Procedure: ENDOSCOPIC SINUS SURGERY WITH FUSION NAVIGATION;  Surgeon: Suzanna ObeyJohn Byers, MD;  Location: [facility];  Service: ENT;  Laterality: Bilateral;   TUBAL LIGATION  2000    FAMILY HISTORY: Family History  Problem Relation Age of Onset   Cancer Mother        lung   Arthritis Mother    Heart disease  Father    Cancer Father        lung    Hypertension Father    Heart disease Brother    Hypertension Brother    Diabetes Other    Arthritis/Rheumatoid Other    Breast cancer Neg Hx     SOCIAL HISTORY: Social History   Socioeconomic History   Marital status: Married    Spouse name: Zella BallRobin   Number of children: 2   Years of education: 10   Highest education level: 10th grade  Occupational History   Occupation: Disabled  Tobacco  Use   Smoking status: Every Day    Packs/day: 0.50    Years: 30.00    Pack years: 15.00    Types: Cigarettes   Smokeless tobacco: Never  Vaping Use   Vaping Use: Never used  Substance and Sexual Activity   Alcohol use: Yes    Alcohol/week: 4.0 standard drinks    Types: 4 Cans of beer per week    Comment: occ   Drug use: Yes    Types: Marijuana    Comment: occasionally   Sexual activity: Yes  Other Topics Concern   Not on file  Social History Narrative   Lives at home with son   Married, 1 son and 1 stepson , 1 daughter    Right handed   10 th grade   3-4 cups caffeine per day.   Social Determinants of Health   Financial Resource Strain: Not on file  Food Insecurity: Not on file  Transportation Needs: Not on file  Physical Activity: Not on file  Stress: Not on file  Social Connections: Not on file  Intimate Partner Violence: Not At Risk   Fear of Current or Ex-Partner: No   Emotionally Abused: No   Physically Abused: No   Sexually Abused: No      Levert Feinstein, M.D. Ph.D.  [facility] [address], [address] [place], [place] [address] Ph: [phone] Fax: [phone]  CC:  Bennie Pierini, FNP [address] [place],  [place] [address]  Bennie Pierini, FNP

## 2021-01-28 ENCOUNTER — Ambulatory Visit: Payer: PPO | Admitting: Nurse Practitioner

## 2021-01-28 IMAGING — US US EXTREM LOW VENOUS*R*
1 series · 13 of 24 positions shown · non-contrast
Comparison: None.

CLINICAL DATA: Right lower extremity edema. History of fall in
bruising 1 week ago. History of right total knee replacement
performed 5334. Evaluate for DVT.



[Series 1: us venous img lower uni right (dvt) · portal-venous · 13 of 36 slices shown]
[im 1/36]
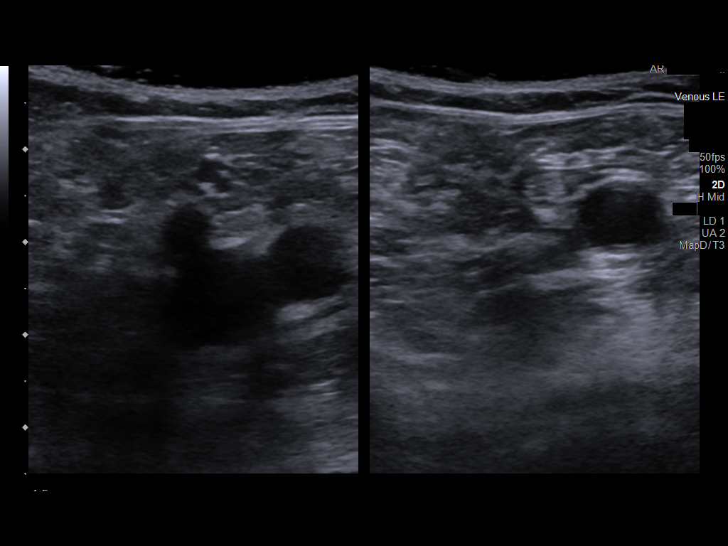
[im 4/36]
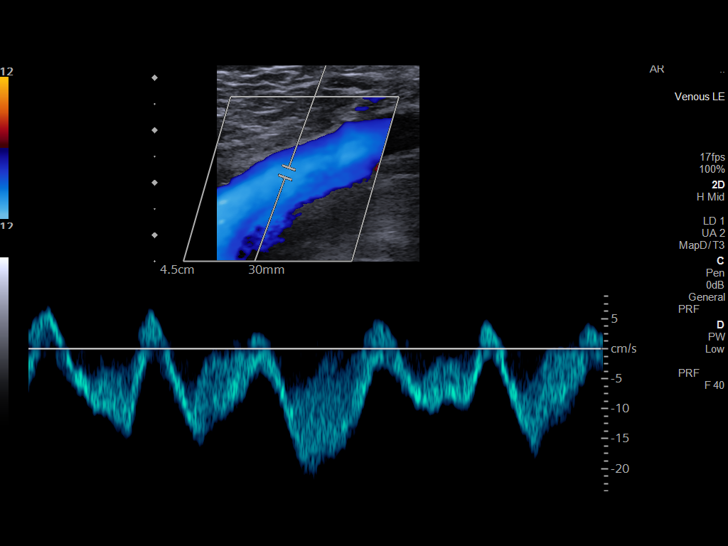
[im 7/36]
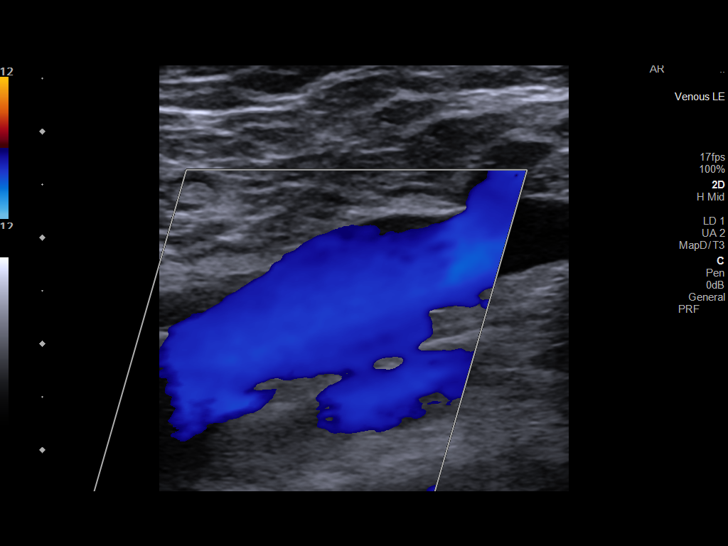
[im 10/36]
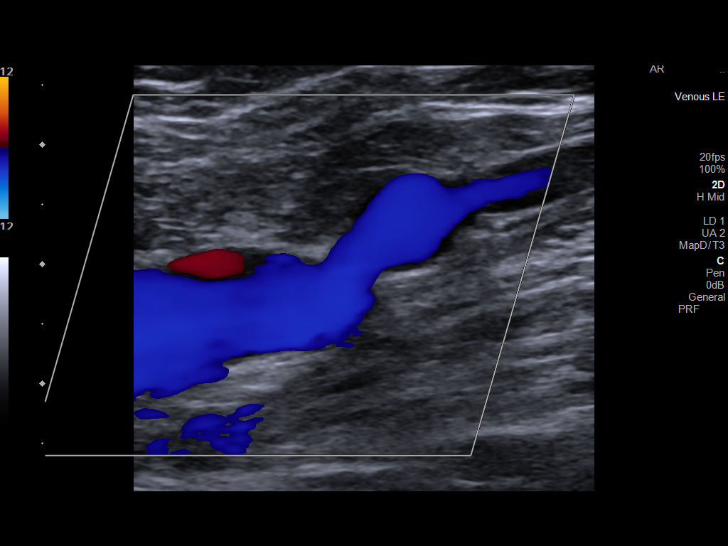
[im 13/36]
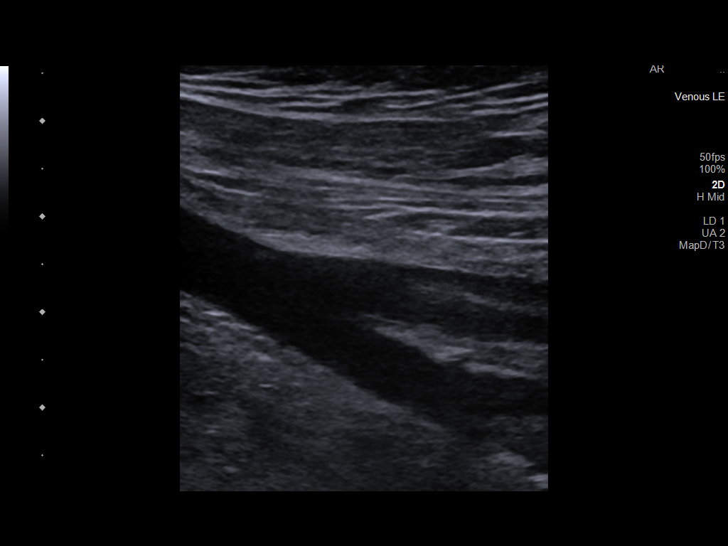
[im 16/36]
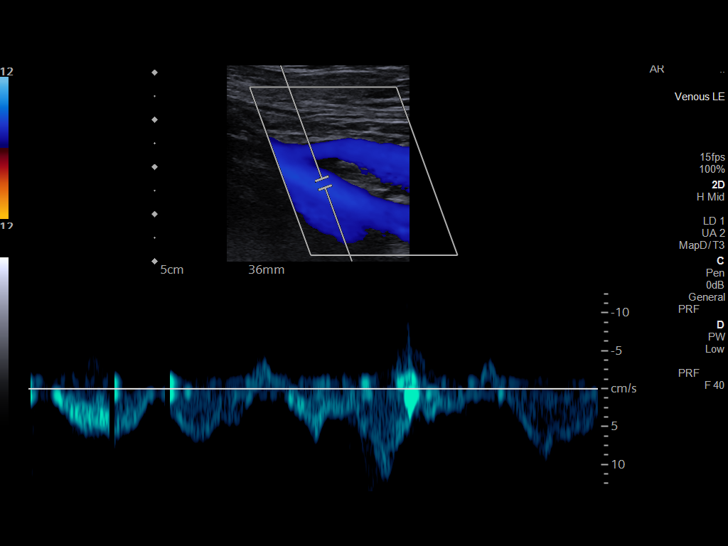
[im 19/36]
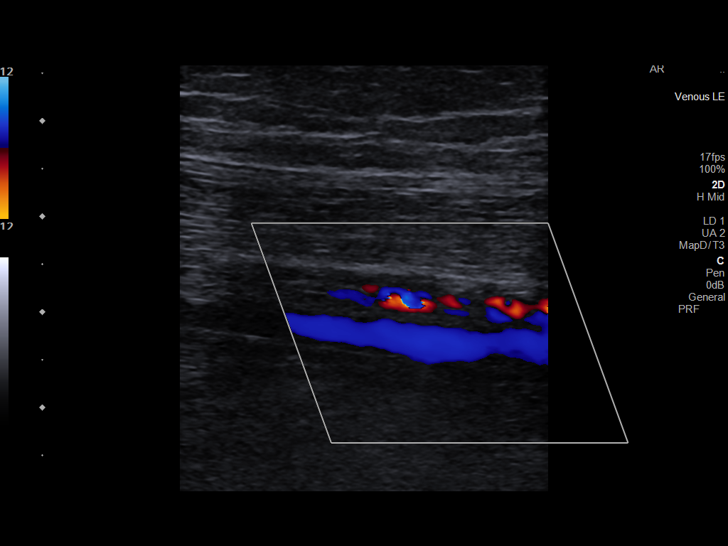
[im 20/36]
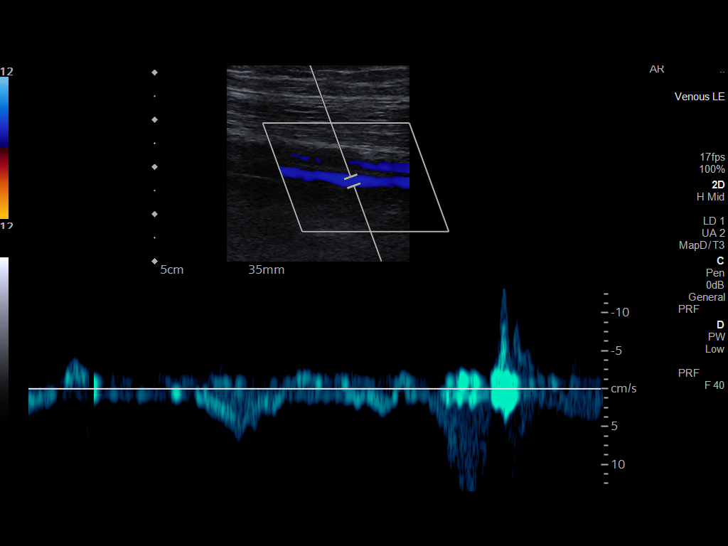
[im 23/36]
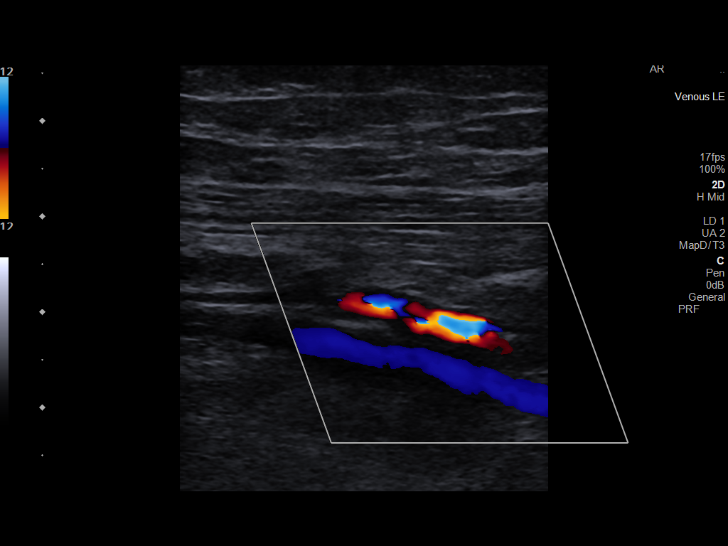
[im 26/36]
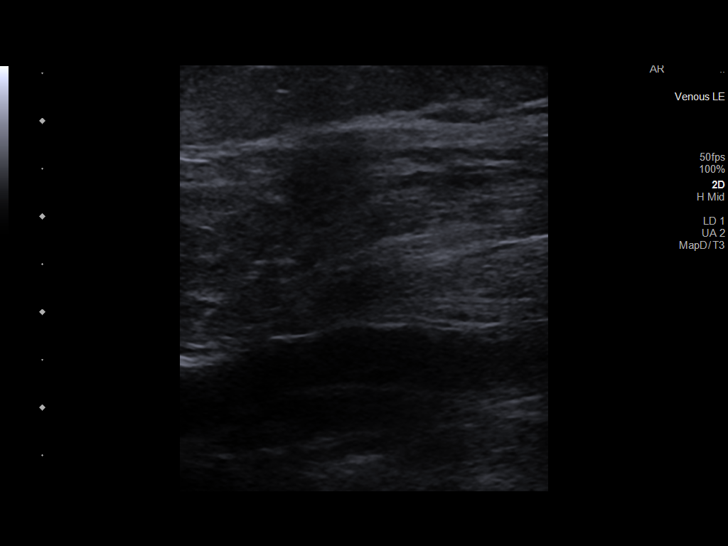
[im 29/36]
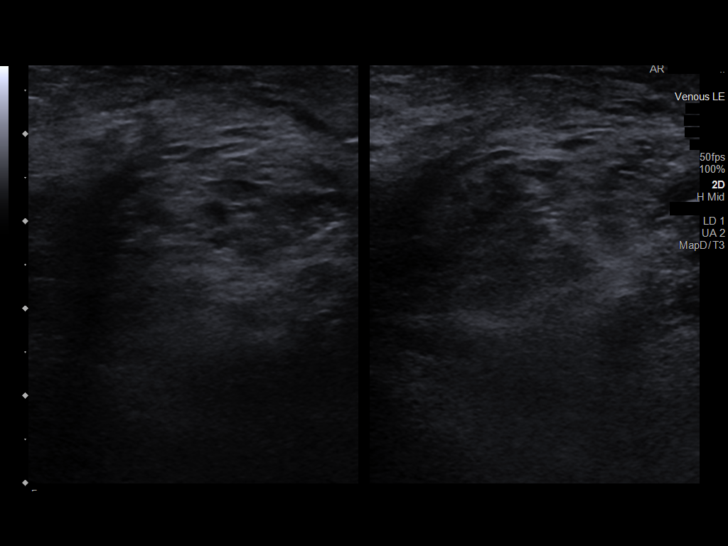
[im 32/36]
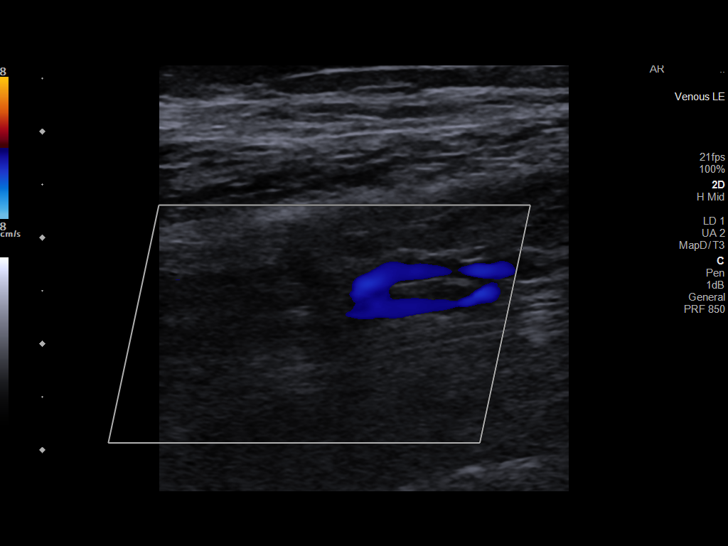
[im 36/36]
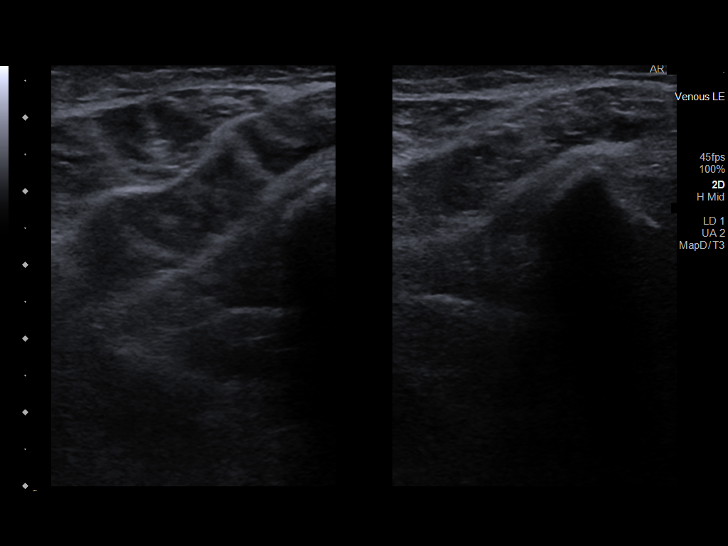

[13 of 24 positions shown; findings below may reference images not displayed]

FINDINGS: Contralateral Common Femoral Vein: Respiratory phasicity is normal
and symmetric with the symptomatic side. No evidence of thrombus.
Normal compressibility.

Common Femoral Vein: No evidence of thrombus. Normal
compressibility, respiratory phasicity and response to augmentation.

Saphenofemoral Junction: No evidence of thrombus. Normal
compressibility and flow on color Doppler imaging.

Profunda Femoral Vein: No evidence of thrombus. Normal
compressibility and flow on color Doppler imaging.

Femoral Vein: No evidence of thrombus. Normal compressibility,
respiratory phasicity and response to augmentation.

Popliteal Vein: No evidence of thrombus. Normal compressibility,
respiratory phasicity and response to augmentation.

Calf Veins: No evidence of thrombus. Normal compressibility and flow
on color Doppler imaging.

Superficial Great Saphenous Vein: No evidence of thrombus. Normal
compressibility.

Venous Reflux:  None.

Other Findings:  None.
IMPRESSION: No evidence of DVT within the right lower extremity.

## 2021-01-29 ENCOUNTER — Institutional Professional Consult (permissible substitution): Payer: PPO | Admitting: Surgical

## 2021-01-29 ENCOUNTER — Telehealth: Payer: Self-pay | Admitting: Neurology

## 2021-01-29 ENCOUNTER — Telehealth: Payer: Self-pay

## 2021-01-29 LAB — URINE CULTURE

## 2021-01-29 NOTE — Telephone Encounter (Signed)
Received Xeomin charge sheet and signed patient consent (given to medical records). Requesting 300 units of Xeomin (L5449) for spastic hemiplegia (G81.11). CPT [id number], [id number], [id number], [id number]. Patient has [employer], no auth required. I called patient and LVM regarding scheduling injection appointment.

## 2021-01-29 NOTE — Telephone Encounter (Signed)
Wellness check. Patient states that she is waiting to hear back from neurologist regarding scheduling botox injections to treat spasticity. She states that she just left a voicemail today and looks forward to hearing from them.   Henrene Dodge PT, DPT

## 2021-01-29 NOTE — Telephone Encounter (Signed)
Patient returned my call. She is scheduled for 8/29 at 3pm. She had no questions at this time.

## 2021-01-30 ENCOUNTER — Other Ambulatory Visit: Payer: Self-pay | Admitting: Nurse Practitioner

## 2021-01-30 DIAGNOSIS — I6930 Unspecified sequelae of cerebral infarction: Secondary | ICD-10-CM

## 2021-02-03 ENCOUNTER — Encounter: Payer: Self-pay | Admitting: Neurology

## 2021-02-03 ENCOUNTER — Ambulatory Visit: Payer: PPO | Admitting: Neurology

## 2021-02-03 ENCOUNTER — Other Ambulatory Visit: Payer: Self-pay

## 2021-02-03 VITALS — BP 131/90 | HR 80 | Ht 65.0 in | Wt 149.5 lb

## 2021-02-03 DIAGNOSIS — G8111 Spastic hemiplegia affecting right dominant side: Secondary | ICD-10-CM | POA: Diagnosis not present

## 2021-02-03 DIAGNOSIS — I639 Cerebral infarction, unspecified: Secondary | ICD-10-CM

## 2021-02-03 DIAGNOSIS — R252 Cramp and spasm: Secondary | ICD-10-CM

## 2021-02-03 MED ORDER — INCOBOTULINUMTOXINA 100 UNITS IM SOLR
300.0000 [IU] | INTRAMUSCULAR | Status: DC
  Administered 2021-02-03: 300 [IU] via INTRAMUSCULAR

## 2021-02-03 NOTE — Progress Notes (Signed)
Chief Complaint  Patient presents with   Procedure    Xeomin      ASSESSMENT AND PLAN  Cheryl Reeves is a 50 y.o. female   Seizure  1 seizure was during her stroke, recurrent seizure couple years later, has been on stable dose of carbamazepine 200 mg twice a day, will change to Zonegran 200 mg every night to simplify the regimen, and also benefit for migraine  Chronic migraine  Zonegran 200 mg every night as preventive medication  Not a good candidate for triptan, tried Bernita Raisin of 50 mg multiple times does not help, currently frequent use of over-the-counter medication including Claritin-D, I have suggested to stop, which has Sudafed, vasoconstriction component  Try Fioricet as needed, limit the use to less than twice each week, may combine with Zofran, Aleve, even baclofen for severe prolonged headaches   History of stroke in 1999, with residual right hemiparesis,  She complains of increased right lower extremity spasticity, frequent spastic right ankle plantarflexion  Is on baclofen 10 mg twice a day  Electrical stimulation guided xeomin injection. Right tibialis posterior 50 units Right flexor digitorum longus 50 units Right medial gastrocnemius 50 units Right extensor pollicis longus 50 units Right adductor longus 100 units  DIAGNOSTIC DATA (LABS, IMAGING, TESTING) - I reviewed patient records, labs, notes, testing and imaging myself where available.   MEDICAL HISTORY:  Cheryl Reeves is a 50 years old female,  seen in request by her primary care NP  Daphine Deutscher, Mary-Margaret for evaluation of     I reviewed and summarized the referring note.  Cheryl Reeves is a 50 years old right-handed female, seen in refer by her primary care physician Dr. Rudi Heap for evaluation of frequent headaches, last visit was with Dr. Hosie Poisson in 2015   She suffered stroke in 1999, was attributed to post partum hemorrhage, with mild aphasia, mild right upper extremity weakness,  spastic gait right lower extremity weakness, she also had one seizure when she had a stroke, recurrent seizure in 2001, taking Tegretol 200 mg twice a day   She has frequent headaches even before she had stroke, contributed her sinus headache, had severe headache the day she had stroke, and persistent almost daily headache ever since, since 2014, she has moderate sinus pressure headaches, has been taking daily ibuprofen 10-12 tablets each day, has tried different over-the-counter sinus medications without helping her symptoms.   She never tried preventive medications in the past, she complains of excessive stress, her son just moved out from her house to live with her ex-husband, has difficulty sleeping, is taking Wellbutrin 150 mg every morning I have personally reviewed MRI of the brain in 2007, left frontal encephalomalacia   UPDATE Feb 25 2015:She only take one nortirptyline 25mg  qhs, instead of 2 tablets every night, which has been helpful, she no longer has constant headaches, but she has to take ibuprofen up to 5 tablets each time for her right side low back pain, she complains of right hip, low back pain, related to her abnormal gait, she denies shooting pain to her right lower extremity   UPDATE September 19 2019: She has lost follow-up since last visit in June 2018, she came back complains of worsening headaches, up to 5 days each week, reviewing previous record nortriptyline up to 50 mg every night has been helpful,   She thought her migraine headache is sinus related, over the years, she has self medicated with over-the-counter sinus medications including daily Sudafed,  multiple dose, Claritin, Mucinex sinus, Alka-Seltzer, ibuprofen up to 15 tablets daily, she complains of daily severe headaches, not function well because of significant headaches, holocranial, pressure, behind eye pain, light noise sensitivity   I personally reviewed CT head without contrast in March 2019 showed stable left  frontal encephalomalacia and porencephaly, no acute intracranial abnormality CT of cervical spine showed multilevel degenerative disc disease,   MRI of brain in 2007:  No acute intracranial abnormality.  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     Laboratory evaluation in 2020,  elevated LDL 114, cholesterol 220, CMP showed mild decrease sodium 127,   UPDATE January 27 2021: Today she returns to evaluate Botox injection for increased to right lower extremity spasticity, tendency for right ankle with plantarflexion, tried physical therapy, helped some  She still complains a lot of headache about twice a week, lasting all day, she take frequent Claritin-D, and ibuprofen, which only provide partial help, recently tried [place] as needed was not helping her headache  Was not sure the benefit of Effexor XR 37.5 mg, she continued Tegretol-XR 200 mg twice a day as seizure prevention, has not had seizure for a long time,  UPDATE February 03 2021: We used xeomin 100 units x3 for today's injection to right lower extremity  PHYSICAL EXAM:   Vitals:   02/03/21 1507  BP: 131/90  Pulse: 80  Weight: 149 lb 8 oz (67.8 kg)  Height: 5\' 5"  (1.651 m)   Not recorded     Body mass index is 24.88 kg/m.  PHYSICAL EXAMNIATION:   MOTOR: Spastic right hemiparesis, fixation of right upper extremity upon rapid rotating movement, mild right hip flexion weakness, moderate right ankle dorsiflexion weakness, wearing right ankle brace, tendency for right ankle plantarflexion, also almost constant right toe extension  REFLEXES: Hyperreflexia of right upper and lower extremity, right Babinski sign,  GAIT/STANCE: Need push-up to get up from seated position, spastic right hemicircumferential unsteady gait tendency for right ankle plantarflexion   , M.D. Ph.D.  [facility] [address] [place], [place]  [place] Ph: [phone] Fax: [phone]  CC:  [phone], FNP [address] [place],  [place]  [id number], FNP

## 2021-02-03 NOTE — Progress Notes (Signed)
**  Xeomin 100 units x 3 vials, NDC 0259-1610-01, Lot [id number], Exp 08/2022, first injection today, office supply.//mck,rn**

## 2021-02-17 ENCOUNTER — Ambulatory Visit (INDEPENDENT_AMBULATORY_CARE_PROVIDER_SITE_OTHER): Payer: PPO | Admitting: Family

## 2021-02-17 ENCOUNTER — Encounter: Payer: Self-pay | Admitting: Family

## 2021-02-17 ENCOUNTER — Encounter: Payer: Self-pay | Admitting: Nurse Practitioner

## 2021-02-17 ENCOUNTER — Other Ambulatory Visit: Payer: Self-pay | Admitting: Nurse Practitioner

## 2021-02-17 DIAGNOSIS — R399 Unspecified symptoms and signs involving the genitourinary system: Secondary | ICD-10-CM | POA: Diagnosis not present

## 2021-02-17 DIAGNOSIS — R109 Unspecified abdominal pain: Secondary | ICD-10-CM

## 2021-02-17 DIAGNOSIS — K219 Gastro-esophageal reflux disease without esophagitis: Secondary | ICD-10-CM

## 2021-02-17 LAB — URINALYSIS, COMPLETE
Bilirubin, UA: NEGATIVE
Nitrite, UA: POSITIVE — AB
Specific Gravity, UA: 1.015 (ref 1.005–1.030)
Urobilinogen, Ur: 2 mg/dL — ABNORMAL HIGH (ref 0.2–1.0)
pH, UA: 7 (ref 5.0–7.5)

## 2021-02-17 LAB — MICROSCOPIC EXAMINATION
RBC, Urine: >30 /hpf — AB (ref 0–2)
Renal Epithel, UA: NONE SEEN /hpf
WBC, UA: >30 /hpf — AB (ref 0–5)

## 2021-02-17 MED ORDER — FLUCONAZOLE 150 MG PO TABS: 150.0000 mg | ORAL_TABLET | ORAL | 0 refills | Status: DC | PRN

## 2021-02-17 MED ORDER — CIPROFLOXACIN HCL 500 MG PO TABS: 500.0000 mg | ORAL_TABLET | Freq: Two times a day (BID) | ORAL | 0 refills | Status: DC

## 2021-02-17 NOTE — Progress Notes (Signed)
Virtual Visit  Note Due to COVID-19 pandemic this visit was conducted virtually. This visit type was conducted due to national recommendations for restrictions regarding the COVID-19 Pandemic (e.g. social distancing, sheltering in place) in an effort to limit this patient's exposure and mitigate transmission in our community. All issues noted in this document were discussed and addressed.  A physical exam was not performed with this format.  I connected with Cheryl Reeves on 02/17/21 at 12:51 pm  by telephone and verified that I am speaking with the correct person using two identifiers. Cheryl Reeves is currently located at home and no one is currently with her during visit. The provider, Jannifer Rodney, FNP is located in their office at time of visit.  I discussed the limitations, risks, security and privacy concerns of performing an evaluation and management service by telephone and the availability of in person appointments. I also discussed with the patient that there may be a patient responsible charge related to this service. The patient expressed understanding and agreed to proceed.   [name], mis are scheduled for a virtual visit with your provider today.    Just as we do with appointments in the office, we must obtain your consent to participate.  Your consent will be active for this visit and any virtual visit you may have with one of our providers in the next 365 days.    If you have a MyChart account, I can also send a copy of this consent to you electronically.  All virtual visits are billed to your insurance company just like a traditional visit in the office.  As this is a virtual visit, video technology does not allow for your provider to perform a traditional examination.  This may limit your provider's ability to fully assess your condition.  If your provider identifies any concerns that need to be evaluated in person or the need to arrange testing such as labs, EKG, etc,  we will make arrangements to do so.    Although advances in technology are sophisticated, we cannot ensure that it will always work on either your end or our end.  If the connection with a video visit is poor, we may have to switch to a telephone visit.  With either a video or telephone visit, we are not always able to ensure that we have a secure connection.   I need to obtain your verbal consent now.   Are you willing to proceed with your visit today?   Cheryl Reeves has provided verbal consent on 02/17/2021 for a virtual visit (video or telephone).   Jannifer Rodney, [place] 02/17/2021  12:54 PM   History and Present Illness:  Pt calls the office today with UTI symptoms that started this morning. She has dropped a urine off this morning.  Dysuria  This is a new problem. The current episode started today. The problem occurs every urination. The problem has been gradually worsening. The quality of the pain is described as aching. The pain is at a severity of 6/10. The pain is mild. There has been no fever. Associated symptoms include flank pain, frequency, hesitancy and urgency. Pertinent negatives include no hematuria, nausea or vomiting.     Review of Systems  Gastrointestinal:  Negative for nausea and vomiting.  Genitourinary:  Positive for dysuria, flank pain, frequency, hesitancy and urgency. Negative for hematuria.    Observations/Objective: No SOB or distress noted  Assessment and Plan: 1. UTI symptoms Force fluids AZO over the  counter X2 days RTO if symptoms worsen or do not improve  Culture pending - Urinalysis, Complete - Urine Culture - ciprofloxacin (CIPRO) 500 MG tablet; Take 1 tablet (500 mg total) by mouth 2 (two) times daily.  Dispense: 14 tablet; Refill: 0 - fluconazole (DIFLUCAN) 150 MG tablet; Take 1 tablet (150 mg total) by mouth every three (3) days as needed.  Dispense: 3 tablet; Refill: 0  2. Flank pain - ciprofloxacin (CIPRO) 500 MG tablet; Take 1 tablet  (500 mg total) by mouth 2 (two) times daily.  Dispense: 14 tablet; Refill: 0     I discussed the assessment and treatment plan with the patient. The patient was provided an opportunity to ask questions and all were answered. The patient agreed with the plan and demonstrated an understanding of the instructions.   The patient was advised to call back or seek an in-person evaluation if the symptoms worsen or if the condition fails to improve as anticipated.  The above assessment and management plan was discussed with the patient. The patient verbalized understanding of and has agreed to the management plan. Patient is aware to call the clinic if symptoms persist or worsen. Patient is aware when to return to the clinic for a follow-up visit. Patient educated on when it is appropriate to go to the emergency department.   Time call ended:  1:02 pm   I provided 11 minutes of  non face-to-face time during this encounter.    Jannifer Rodney, FNP

## 2021-02-20 LAB — URINE CULTURE

## 2021-03-24 ENCOUNTER — Encounter: Payer: Self-pay | Admitting: Nurse Practitioner

## 2021-03-24 ENCOUNTER — Other Ambulatory Visit: Payer: Self-pay

## 2021-03-24 ENCOUNTER — Ambulatory Visit (INDEPENDENT_AMBULATORY_CARE_PROVIDER_SITE_OTHER): Payer: PPO | Admitting: Nurse Practitioner

## 2021-03-24 VITALS — BP 142/92 | HR 82 | Temp 97.7°F | Resp 20 | Ht 65.0 in | Wt 149.0 lb

## 2021-03-24 DIAGNOSIS — I6930 Unspecified sequelae of cerebral infarction: Secondary | ICD-10-CM

## 2021-03-24 DIAGNOSIS — E7800 Pure hypercholesterolemia, unspecified: Secondary | ICD-10-CM

## 2021-03-24 DIAGNOSIS — F411 Generalized anxiety disorder: Secondary | ICD-10-CM | POA: Diagnosis not present

## 2021-03-24 DIAGNOSIS — M25542 Pain in joints of left hand: Secondary | ICD-10-CM | POA: Diagnosis not present

## 2021-03-24 DIAGNOSIS — G8111 Spastic hemiplegia affecting right dominant side: Secondary | ICD-10-CM | POA: Diagnosis not present

## 2021-03-24 DIAGNOSIS — F419 Anxiety disorder, unspecified: Secondary | ICD-10-CM

## 2021-03-24 DIAGNOSIS — M21371 Foot drop, right foot: Secondary | ICD-10-CM | POA: Diagnosis not present

## 2021-03-24 DIAGNOSIS — J0100 Acute maxillary sinusitis, unspecified: Secondary | ICD-10-CM

## 2021-03-24 DIAGNOSIS — K219 Gastro-esophageal reflux disease without esophagitis: Secondary | ICD-10-CM

## 2021-03-24 DIAGNOSIS — R569 Unspecified convulsions: Secondary | ICD-10-CM | POA: Diagnosis not present

## 2021-03-24 DIAGNOSIS — G43709 Chronic migraine without aura, not intractable, without status migrainosus: Secondary | ICD-10-CM | POA: Diagnosis not present

## 2021-03-24 MED ORDER — BUPROPION HCL ER (XL) 300 MG PO TB24: 300.0000 mg | ORAL_TABLET | Freq: Every day | ORAL | 1 refills | Status: DC

## 2021-03-24 MED ORDER — AMOXICILLIN-POT CLAVULANATE 875-125 MG PO TABS: 1.0000 | ORAL_TABLET | Freq: Two times a day (BID) | ORAL | 0 refills | Status: DC

## 2021-03-24 MED ORDER — BACLOFEN 10 MG PO TABS: 10.0000 mg | ORAL_TABLET | Freq: Two times a day (BID) | ORAL | 2 refills | Status: DC

## 2021-03-24 MED ORDER — HYDROXYZINE PAMOATE 25 MG PO CAPS: 25.0000 mg | ORAL_CAPSULE | Freq: Two times a day (BID) | ORAL | 2 refills | Status: DC | PRN

## 2021-03-24 MED ORDER — ATORVASTATIN CALCIUM 20 MG PO TABS: 20.0000 mg | ORAL_TABLET | Freq: Every day | ORAL | 1 refills | Status: DC

## 2021-03-24 MED ORDER — LISINOPRIL 20 MG PO TABS: 20.0000 mg | ORAL_TABLET | Freq: Every day | ORAL | 1 refills | Status: DC

## 2021-03-24 MED ORDER — PANTOPRAZOLE SODIUM 40 MG PO TBEC: 40.0000 mg | DELAYED_RELEASE_TABLET | Freq: Every day | ORAL | 1 refills | Status: DC

## 2021-03-24 NOTE — Progress Notes (Signed)
Subjective:    Patient ID: Cheryl Reeves, female    DOB: January 14, 1971, 50 y.o.   MRN: [id number]  Chief Complaint: Medical Management of Chronic Issues (Wants blood work to check for arthritis)    HPI:  1. Late effect of cerebrovascular accident (CVA) Had years ago and is weak on right side. [place]k with cane.  2. Seizures ([place]) None in years - is on zonisamide and sees neurology.  3. Right spastic hemiplegia ([place]) Is on baclofen which helps. She also gets botox injections in her feet.  4. Chronic migraine without aura without status migrainosus, not intractable Has about 2 x a month uses fioricet which helps  5. Gastroesophageal reflux disease, unspecified whether esophagitis present Is on protonix daily and is doing well.  6. Foot drop, right Has had botox injections and has not really helped.    Outpatient Encounter Medications as of 03/24/2021  Medication Sig   aspirin 81 MG tablet Take 81 mg by mouth daily.   atorvastatin (LIPITOR) 20 MG tablet Take 1 tablet (20 mg total) by mouth daily.   baclofen (LIORESAL) 10 MG tablet TAKE ONE TABLET BY MOUTH TWICE DAILY   buPROPion (WELLBUTRIN XL) 300 MG 24 hr tablet Take 1 tablet (300 mg total) by mouth daily. Needs appointment for further refills   butalbital-acetaminophen-caffeine (FIORICET) 50-325-40 MG tablet Take 1 tablet by mouth every 6 (six) hours as needed for headache.   fluconazole (DIFLUCAN) 150 MG tablet Take 1 tablet (150 mg total) by mouth every three (3) days as needed.   hydrOXYzine (VISTARIL) 25 MG capsule Take 1 capsule (25 mg total) by mouth 2 (two) times daily as needed.   ibuprofen (ADVIL) 800 MG tablet Take 1 tablet (800 mg total) by mouth every 8 (eight) hours as needed for moderate pain.   lisinopril (ZESTRIL) 20 MG tablet TAKE ONE (1) TABLET EACH DAY (Patient taking differently: Take 20 mg by mouth daily.)   ondansetron (ZOFRAN ODT) 4 MG disintegrating tablet Take 1 tablet (4 mg total) by mouth every 8  (eight) hours as needed for nausea or vomiting.   pantoprazole (PROTONIX) 40 MG tablet TAKE ONE (1) TABLET EACH DAY   zonisamide (ZONEGRAN) 100 MG capsule Take 2 capsules (200 mg total) by mouth at bedtime.   [DISCONTINUED] ciprofloxacin (CIPRO) 500 MG tablet Take 1 tablet (500 mg total) by mouth 2 (two) times daily.   Facility-Administered Encounter Medications as of 03/24/2021  Medication   incobotulinumtoxinA (XEOMIN) 100 units injection 300 Units    Past Surgical History:  Procedure Laterality Date   ARTHROSCOPY KNEE W/ DRILLING  2008   rt   REPLACEMENT TOTAL KNEE Right 2009   SEPTOPLASTY Bilateral 07/21/2013   Procedure: SEPTOPLASTY;  Surgeon: Cheryl Montane, MD;  Location: [facility];  Service: ENT;  Laterality: Bilateral;   SINUS ENDO W/FUSION Bilateral 07/21/2013   Procedure: ENDOSCOPIC SINUS SURGERY WITH FUSION NAVIGATION;  Surgeon: Cheryl Montane, MD;  Location: [facility];  Service: ENT;  Laterality: Bilateral;   TUBAL LIGATION  2000    Family History  Problem Relation Age of Onset   Cancer Mother        lung   Arthritis Mother    Heart disease Father    Cancer Father        lung    Hypertension Father    Heart disease Brother    Hypertension Brother    Diabetes Other    Arthritis/Rheumatoid Other    Breast cancer Neg  Hx     New complaints: -Nasal congestion and facial pressure. - has lots of joint pain in fingers and left elbow Social history: Lives with her husband  Controlled substance contract: n/a     Review of Systems  Constitutional:  Negative for diaphoresis.  HENT:  Positive for congestion, postnasal drip, rhinorrhea, sinus pressure and sinus pain. Negative for sore throat.   Eyes:  Negative for pain.  Respiratory:  Negative for shortness of breath.   Cardiovascular:  Negative for chest pain, palpitations and leg swelling.  Gastrointestinal:  Negative for abdominal pain.  Endocrine: Negative for polydipsia.  Skin:   Negative for rash.  Neurological:  Negative for dizziness, weakness and headaches.  Hematological:  Does not bruise/bleed easily.  All other systems reviewed and are negative.     Objective:   Physical Exam Vitals and nursing note reviewed.  Constitutional:      General: She is not in acute distress.    Appearance: Normal appearance. She is well-developed.  HENT:     Head: Normocephalic.     Right Ear: Tympanic membrane normal.     Left Ear: Tympanic membrane normal.     Nose: Nose normal.     Mouth/Throat:     Mouth: Mucous membranes are moist.  Eyes:     Pupils: Pupils are equal, round, and reactive to light.  Neck:     Vascular: No carotid bruit or JVD.  Cardiovascular:     Rate and Rhythm: Normal rate and regular rhythm.     Heart sounds: Normal heart sounds.  Pulmonary:     Effort: Pulmonary effort is normal. No respiratory distress.     Breath sounds: Normal breath sounds. No wheezing or rales.  Chest:     Chest wall: No tenderness.  Abdominal:     General: Bowel sounds are normal. There is no distension or abdominal bruit.     Palpations: Abdomen is soft. There is no hepatomegaly, splenomegaly, mass or pulsatile mass.     Tenderness: There is no abdominal tenderness.  Musculoskeletal:        General: Normal range of motion.     Cervical back: Normal range of motion and neck supple.  Lymphadenopathy:     Cervical: No cervical adenopathy.  Skin:    General: Skin is warm and dry.  Neurological:     Mental Status: She is alert and oriented to person, place, and time.     Deep Tendon Reflexes: Reflexes are normal and symmetric.  Psychiatric:        Behavior: Behavior normal.        Thought Content: Thought content normal.        Judgment: Judgment normal.    BP (!) 142/92   Pulse 82   Temp 97.7 F (36.5 C) (Temporal)   Resp 20   Ht 5' 5"  (1.651 m)   Wt 149 lb (67.6 kg)   SpO2 99%   BMI 24.79 kg/m        Assessment & Plan:   Cheryl Reeves comes  in today with chief complaint of Medical Management of Chronic Issues (Wants blood work to check for arthritis)   Diagnosis and orders addressed:  1. Late effect of cerebrovascular accident (CVA) Fall prevetion  - CBC with Differential/Platelet - CMP14+EGFR - Lipid panel - baclofen (LIORESAL) 10 MG tablet; Take 1 tablet (10 mg total) by mouth 2 (two) times daily.  Dispense: 60 each; Refill: 2  2. Seizures ([place]) Keep follow up  with neurology  3. Right spastic hemiplegia ([place]) Fall prevention  4. Chronic migraine without aura without status migrainosus, not intractable Avoid caffeine  5. Gastroesophageal reflux disease, unspecified whether esophagitis present Avoid spicy foods Do not eat 2 hours prior to bedtime  - pantoprazole (PROTONIX) 40 MG tablet; Take 1 tablet (40 mg total) by mouth daily.  Dispense: 90 tablet; Refill: 1  6. Foot drop, right Wear brace in shoe  7. Acute maxillary sinusitis, recurrence not specified 1. Take meds as prescribed 2. Use a cool mist humidifier especially during the winter months and when heat has been humid. 3. Use saline nose sprays frequently 4. Saline irrigations of the nose can be very helpful if done frequently.  * 4X daily for 1 week*  * Use of a nettie pot can be helpful with this. Follow directions with this* 5. Drink plenty of fluids 6. Keep thermostat turn down low 7.For any cough or congestion  Use plain Mucinex- regular strength or max strength is fine   * Children- consult with Pharmacist for dosing 8. For fever or aces or pains- take tylenol or ibuprofen appropriate for age and weight.  * for fevers greater than 101 orally you may alternate ibuprofen and tylenol every  3 hours.    - amoxicillin-clavulanate (AUGMENTIN) 875-125 MG tablet; Take 1 tablet by mouth 2 (two) times daily.  Dispense: 14 tablet; Refill: 0  8. Arthralgia of left hand Labs pending - Arthritis Panel  9. Anxiety Stress management - hydrOXYzine  (VISTARIL) 25 MG capsule; Take 1 capsule (25 mg total) by mouth 2 (two) times daily as needed.  Dispense: 60 capsule; Refill: 2 - buPROPion (WELLBUTRIN XL) 300 MG 24 hr tablet; Take 1 tablet (300 mg total) by mouth daily. Needs appointment for further refills  Dispense: 90 tablet; Refill: 1  11. Pure hypercholesterolemia Low fat diet - atorvastatin (LIPITOR) 20 MG tablet; Take 1 tablet (20 mg total) by mouth daily.  Dispense: 90 tablet; Refill: 1   Labs pending Health Maintenance reviewed Diet and exercise encouraged  Follow up plan: 6 months   Mary-Margaret Hassell Done, FNP

## 2021-03-24 NOTE — Patient Instructions (Signed)

## 2021-03-25 LAB — CMP14+EGFR
ALT: 8 IU/L (ref 0–32)
AST: 18 IU/L (ref 0–40)
Albumin/Globulin Ratio: 1.9 (ref 1.2–2.2)
Albumin: 4.6 g/dL (ref 3.8–4.8)
Alkaline Phosphatase: 103 IU/L (ref 44–121)
BUN/Creatinine Ratio: 18 (ref 9–23)
BUN: 16 mg/dL (ref 6–24)
Bilirubin Total: 0.5 mg/dL (ref 0.0–1.2)
CO2: 23 mmol/L (ref 20–29)
Calcium: 9.5 mg/dL (ref 8.7–10.2)
Chloride: 108 mmol/L — ABNORMAL HIGH (ref 96–106)
Creatinine, Ser: 0.89 mg/dL (ref 0.57–1.00)
Globulin, Total: 2.4 g/dL (ref 1.5–4.5)
Glucose: 95 mg/dL (ref 70–99)
Potassium: 4.5 mmol/L (ref 3.5–5.2)
Sodium: 146 mmol/L — ABNORMAL HIGH (ref 134–144)
Total Protein: 7 g/dL (ref 6.0–8.5)
eGFR: 79 mL/min/{1.73_m2} (ref >59)

## 2021-03-25 LAB — LIPID PANEL
Chol/HDL Ratio: 2.4 ratio (ref 0.0–4.4)
Cholesterol, Total: 163 mg/dL (ref 100–199)
HDL: 69 mg/dL (ref >39)
LDL Chol Calc (NIH): 71 mg/dL (ref 0–99)
Triglycerides: 134 mg/dL (ref 0–149)
VLDL Cholesterol Cal: 23 mg/dL (ref 5–40)

## 2021-03-25 LAB — ARTHRITIS PANEL
Basophils Absolute: 0 10*3/uL (ref 0.0–0.2)
Basos: 1 %
EOS (ABSOLUTE): 0.1 10*3/uL (ref 0.0–0.4)
Eos: 2 %
Hematocrit: 43.2 % (ref 34.0–46.6)
Hemoglobin: 14.6 g/dL (ref 11.1–15.9)
Immature Grans (Abs): 0 10*3/uL (ref 0.0–0.1)
Immature Granulocytes: 0 %
Lymphocytes Absolute: 1.3 10*3/uL (ref 0.7–3.1)
Lymphs: 24 %
MCH: 33 pg (ref 26.6–33.0)
MCHC: 33.8 g/dL (ref 31.5–35.7)
MCV: 98 fL — ABNORMAL HIGH (ref 79–97)
Monocytes Absolute: 0.5 10*3/uL (ref 0.1–0.9)
Monocytes: 10 %
Neutrophils Absolute: 3.4 10*3/uL (ref 1.4–7.0)
Neutrophils: 63 %
Platelets: 208 10*3/uL (ref 150–450)
RBC: 4.42 x10E6/uL (ref 3.77–5.28)
RDW: 11.8 % (ref 11.7–15.4)
Rheumatoid fact SerPl-aCnc: 23.3 IU/mL — ABNORMAL HIGH (ref <14.0)
Sed Rate: 2 mm/hr (ref 0–40)
Uric Acid: 5.6 mg/dL (ref 2.6–6.2)
WBC: 5.3 10*3/uL (ref 3.4–10.8)

## 2021-04-15 ENCOUNTER — Ambulatory Visit (INDEPENDENT_AMBULATORY_CARE_PROVIDER_SITE_OTHER): Payer: PPO | Admitting: Nurse Practitioner

## 2021-04-15 ENCOUNTER — Encounter: Payer: Self-pay | Admitting: Nurse Practitioner

## 2021-04-15 DIAGNOSIS — J0101 Acute recurrent maxillary sinusitis: Secondary | ICD-10-CM

## 2021-04-15 DIAGNOSIS — N760 Acute vaginitis: Secondary | ICD-10-CM

## 2021-04-15 DIAGNOSIS — B9689 Other specified bacterial agents as the cause of diseases classified elsewhere: Secondary | ICD-10-CM

## 2021-04-15 MED ORDER — METRONIDAZOLE 500 MG PO TABS: 500.0000 mg | ORAL_TABLET | Freq: Two times a day (BID) | ORAL | 0 refills | Status: DC

## 2021-04-15 MED ORDER — CEFDINIR 300 MG PO CAPS: 300.0000 mg | ORAL_CAPSULE | Freq: Two times a day (BID) | ORAL | 0 refills | Status: DC

## 2021-04-15 NOTE — Patient Instructions (Signed)
Bacterial Vaginosis °Bacterial vaginosis is an infection that occurs when the normal balance of bacteria in the vagina changes. This change is caused by an overgrowth of certain bacteria in the vagina. Bacterial vaginosis is the most common vaginal infection among females aged 50 to 44 years. °This condition increases the risk of sexually transmitted infections (STIs). Treatment can help reduce this risk. Treatment is very important for pregnant women because this condition can cause babies to be born early (prematurely) or at a low birth weight. °What are the causes? °This condition is caused by an increase in harmful bacteria that are normally present in small amounts in the vagina. However, the exact reason this condition develops is not known. °You cannot get bacterial vaginosis from toilet seats, bedding, swimming pools, or contact with objects around you. °What increases the risk? °The following factors may make you more likely to develop this condition: °Having a new sexual partner or multiple sexual partners, or having unprotected sex. °Douching. °Having an intrauterine device (IUD). °Smoking. °Abusing drugs and alcohol. This may lead to riskier sexual behavior. °Taking certain antibiotic medicines. °Being pregnant. °What are the signs or symptoms? °Some women with this condition have no symptoms. Symptoms may include: °Gray or white vaginal discharge. The discharge can be watery or foamy. °A fish-like odor with discharge, especially after sex or during menstruation. °Itching in and around the vagina. °Burning or pain with urination. °How is this diagnosed? °This condition is diagnosed based on: °Your medical history. °A physical exam of the vagina. °Checking a sample of vaginal fluid for harmful bacteria or abnormal cells. °How is this treated? °This condition is treated with antibiotic medicines. These may be given as a pill, a vaginal cream, or a medicine that is put into the vagina (suppository). If the  condition comes back after treatment, a second round of antibiotics may be needed. °Follow these instructions at home: °Medicines °Take or apply over-the-counter and prescription medicines only as told by your health care provider. °Take or apply your antibiotic medicine as told by your health care provider. Do not stop using the antibiotic even if you start to feel better. °General instructions °If you have a female sexual partner, tell her that you have a vaginal infection. She should follow up with her health care provider. If you have a female sexual partner, he does not need treatment. °Avoid sexual activity until you finish treatment. °Drink enough fluid to keep your urine pale yellow. °Keep the area around your vagina and rectum clean. °Wash the area daily with warm water. °Wipe yourself from front to back after using the toilet. °If you are breastfeeding, talk to your health care provider about continuing breastfeeding during treatment. °Keep all follow-up visits. This is important. °How is this prevented? °Self-care °Do not douche. °Wash the outside of your vagina with warm water only. °Wear cotton or cotton-lined underwear. °Avoid wearing tight pants and pantyhose, especially during the summer. °Safe sex °Use protection when having sex. This includes: °Using condoms. °Using dental dams. This is a thin layer of a material made of latex or polyurethane that protects the mouth during oral sex. °Limit the number of sexual partners. To help prevent bacterial vaginosis, it is best to have sex with just one partner (monogamous relationship). °Make sure you and your sexual partner are tested for STIs. °Drugs and alcohol °Do not use any products that contain nicotine or tobacco. These products include cigarettes, chewing tobacco, and vaping devices, such as e-cigarettes. If you need help quitting,   ask your health care provider. °Do not use drugs. °Do not drink alcohol if: °Your health care provider tells you not to  do this. °You are pregnant, may be pregnant, or are planning to become pregnant. °If you drink alcohol: °Limit how much you have to 0-1 drink a day. °Be aware of how much alcohol is in your drink. In the [place] one drink equals one 12 oz bottle of beer (355 mL), one 5 oz glass of wine (148 mL), or one 1½ oz glass of hard liquor (44 mL). °Where to find more information °Centers for Disease Control and Prevention: www.cdc.gov °American Sexual Health Association (ASHA): www.ashastd.org °[place] Department of Health and Human Services, Office on Women's Health: www.womenshealth.gov °Contact a health care provider if: °Your symptoms do not improve, even after treatment. °You have more discharge or pain when urinating. °You have a fever or chills. °You have pain in your abdomen or pelvis. °You have pain during sex. °You have vaginal bleeding between menstrual periods. °Summary °Bacterial vaginosis is a vaginal infection that occurs when the normal balance of bacteria in the vagina changes. It results from an overgrowth of certain bacteria. °This condition increases the risk of sexually transmitted infections (STIs). Getting treated can help reduce this risk. °Treatment is very important for pregnant women because this condition can cause babies to be born early (prematurely) or at low birth weight. °This condition is treated with antibiotic medicines. These may be given as a pill, a vaginal cream, or a medicine that is put into the vagina (suppository). °This information is not intended to replace advice given to you by your health care provider. Make sure you discuss any questions you have with your health care provider. °Document Revised: 11/23/2019 Document Reviewed: 11/23/2019 °Elsevier Patient Education © 2022 Else[employer] Inc. ° °

## 2021-04-15 NOTE — Progress Notes (Signed)
Virtual Visit via video Note   Due to COVID-19 pandemic this visit was conducted virtually. This visit type was conducted due to national recommendations for restrictions regarding the COVID-19 Pandemic (e.g. social distancing, sheltering in place) in an effort to limit this patient's exposure and mitigate transmission in our community. All issues noted in this document were discussed and addressed.  A physical exam was not performed with this format.  I connected with  Cheryl Reeves  on 04/15/21 at 9:12 by video and verified that I am speaking with the correct person using two identifiers. Cheryl Reeves is currently located at home and no ne is currently with  her during visit. The provider, Mary-Margaret Hassell Done, FNP is located in their office at time of visit.  I discussed the limitations, risks, security and privacy concerns of performing an evaluation and management service by video  and the availability of in person appointments. I also discussed with the patient that there may be a patient responsible charge related to this service. The patient expressed understanding and agreed to proceed.   History and Present Illness:  Patient is c/o: - sinus congestion with post nasal drip for over a month. She has been doing claritin D, muciinex D and nettie pot. Has facial pressure. Has a bad tase in her mouth. Her breath even stinks.  - vaginal discharge and itching. Slight fishy smell.   Review of Systems  Constitutional:  Positive for malaise/fatigue. Negative for chills and fever.  HENT:  Positive for congestion and sinus pain. Negative for sore throat.   Respiratory:  Positive for cough. Negative for sputum production and shortness of breath.   Musculoskeletal:  Negative for myalgias.  Neurological:  Negative for headaches.  All other systems reviewed and are negative.     Observations/Objective: Alert and oriented- answers all questions appropriately No distress Facila  pressure in sinuses No cough noted   Assessment and Plan: Cheryl Reeves in today with chief complaint of No chief complaint on file.   1. Bacterial vaginosis No bubble baths Avoid scratching Take antibiotics with food  2. Acute recurrent maxillary sinusitis 1. Take meds as prescribed 2. Use a cool mist humidifier especially during the winter months and when heat has been humid. 3. Use saline nose sprays frequently 4. Saline irrigations of the nose can be very helpful if done frequently.  * 4X daily for 1 week*  * Use of a nettie pot can be helpful with this. Follow directions with this* 5. Drink plenty of fluids 6. Keep thermostat turn down low 7.For any cough or congestion  Use plain Mucinex- regular strength or max strength is fine   * Children- consult with Pharmacist for dosing 8. For fever or aces or pains- take tylenol or ibuprofen appropriate for age and weight.  * for fevers greater than 101 orally you may alternate ibuprofen and tylenol every  3 hours.   Meds ordered this encounter  Medications   metroNIDAZOLE (FLAGYL) 500 MG tablet    Sig: Take 1 tablet (500 mg total) by mouth 2 (two) times daily.    Dispense:  14 tablet    Refill:  0    Order Specific Question:   Supervising Provider    Answer:   Caryl Pina A [[id number]]   cefdinir (OMNICEF) 300 MG capsule    Sig: Take 1 capsule (300 mg total) by mouth 2 (two) times daily. 1 po BID    Dispense:  20 capsule  Refill:  0    Order Specific Question:   Supervising Provider    Answer:   Arville Care A [[id number]]        Follow Up Instructions: Prn- will need to see perineum if not improving    I discussed the assessment and treatment plan with the patient. The patient was provided an opportunity to ask questions and all were answered. The patient agreed with the plan and demonstrated an understanding of the instructions.   The patient was advised to call back or seek an in-person evaluation if  the symptoms worsen or if the condition fails to improve as anticipated.  The above assessment and management plan was discussed with the patient. The patient verbalized understanding of and has agreed to the management plan. Patient is aware to call the clinic if symptoms persist or worsen. Patient is aware when to return to the clinic for a follow-up visit. Patient educated on when it is appropriate to go to the emergency department.   Time call ended:9:24  I provided 12 minutes of face-to-face time during this encounter.    [name], FNP

## 2021-05-07 ENCOUNTER — Ambulatory Visit: Payer: PPO | Admitting: Neurology

## 2021-05-27 ENCOUNTER — Ambulatory Visit (INDEPENDENT_AMBULATORY_CARE_PROVIDER_SITE_OTHER): Payer: PPO | Admitting: Nurse Practitioner

## 2021-05-27 ENCOUNTER — Encounter: Payer: Self-pay | Admitting: Nurse Practitioner

## 2021-05-27 VITALS — BP 139/97 | HR 91 | Temp 98.1°F | Resp 20 | Ht 65.0 in | Wt 149.0 lb

## 2021-05-27 DIAGNOSIS — A6004 Herpesviral vulvovaginitis: Secondary | ICD-10-CM | POA: Diagnosis not present

## 2021-05-27 DIAGNOSIS — L732 Hidradenitis suppurativa: Secondary | ICD-10-CM

## 2021-05-27 DIAGNOSIS — R438 Other disturbances of smell and taste: Secondary | ICD-10-CM | POA: Diagnosis not present

## 2021-05-27 DIAGNOSIS — R300 Dysuria: Secondary | ICD-10-CM

## 2021-05-27 LAB — MICROSCOPIC EXAMINATION
Epithelial Cells (non renal): NONE SEEN /hpf (ref 0–10)
Renal Epithel, UA: NONE SEEN /hpf

## 2021-05-27 LAB — URINALYSIS, COMPLETE
Bilirubin, UA: NEGATIVE
Glucose, UA: NEGATIVE
Ketones, UA: NEGATIVE
Nitrite, UA: NEGATIVE
Protein,UA: NEGATIVE
Specific Gravity, UA: 1.015 (ref 1.005–1.030)
Urobilinogen, Ur: 0.2 mg/dL (ref 0.2–1.0)
pH, UA: 6.5 (ref 5.0–7.5)

## 2021-05-27 MED ORDER — FLUTICASONE PROPIONATE 50 MCG/ACT NA SUSP: 2.0000 | Freq: Every day | NASAL | 6 refills | Status: DC

## 2021-05-27 MED ORDER — SULFAMETHOXAZOLE-TRIMETHOPRIM 800-160 MG PO TABS: 1.0000 | ORAL_TABLET | Freq: Two times a day (BID) | ORAL | 0 refills | Status: DC

## 2021-05-27 MED ORDER — VALACYCLOVIR HCL 1 G PO TABS: 1000.0000 mg | ORAL_TABLET | Freq: Two times a day (BID) | ORAL | 0 refills | Status: AC

## 2021-05-27 NOTE — Progress Notes (Signed)
° °  Subjective:    Patient ID: Cheryl Reeves, female    DOB: 10/29/1970, 50 y.o.   MRN: [id number]   Chief Complaint: Mass   HPI Patient comes in stating that : -she has a mass in her private area that is sore to touch. Started first on right labia nd now has one on left labia- extremely sore to the touch. - hard painful nodules bil axillary area. No drainage.   Review of Systems  Skin:        sores axillary and perineal area      Objective:   Physical Exam Vitals and nursing note reviewed.  Constitutional:      Appearance: Normal appearance.  Cardiovascular:     Rate and Rhythm: Normal rate and regular rhythm.     Heart sounds: Normal heart sounds.  Pulmonary:     Breath sounds: Normal breath sounds.  Genitourinary:    Comments: Erythematous painful blistery lesions bil sided of labia Skin:    General: Skin is warm.     Comments: Hard 3cm erythematous tender nodules bil axillary area.  Neurological:     General: No focal deficit present.     Mental Status: She is oriented to person, place, and time.    BP (!) 139/97    Pulse 91    Temp 98.1 F (36.7 C) (Temporal)    Resp 20    Ht 5\' 5"  (1.651 m)    Wt 149 lb (67.6 kg)    SpO2 100%    BMI 24.79 kg/m        Assessment & Plan:   Maille Teresa in today with chief complaint of Mass and Foul smelling urine   1. Dysuria Urine clear - Urinalysis, Complete  2. Herpes simplex vulvovaginitis Labs oending - Herpes simplex virus culture - HSV(herpes simplex vrs) 1+2 ab-IgG - valACYclovir (VALTREX) 1000 MG tablet; Take 1 tablet (1,000 mg total) by mouth 2 (two) times daily for 10 days.  Dispense: 21 tablet; Refill: 0  3. Hydradenitis Clean with antibacterial soaps Warm compresses - sulfamethoxazole-trimethoprim (BACTRIM DS) 800-160 MG tablet; Take 1 tablet by mouth 2 (two) times daily.  Dispense: 20 tablet; Refill: 0  4. Metallic taste Labs pending - Anemia Profile B - Vitamin B12    The above  assessment and management plan was discussed with the patient. The patient verbalized understanding of and has agreed to the management plan. Patient is aware to call the clinic if symptoms persist or worsen. Patient is aware when to return to the clinic for a follow-up visit. Patient educated on when it is appropriate to go to the emergency department.   Cheryl [place], FNP

## 2021-05-27 NOTE — Addendum Note (Signed)
Addended by: Bennie Pierini on: 05/27/2021 01:07 PM   Modules accepted: Orders

## 2021-05-27 NOTE — Patient Instructions (Signed)
Genital Herpes °Genital herpes is a common sexually transmitted infection (STI) that is caused by a virus. The virus spreads from person to person through sexual contact. The infection can cause itching, blisters, and sores around the genitals or rectum. Symptoms may last for several days and then go away. This is called an outbreak. The virus remains in the body, however, so more outbreaks may happen in the future. The time between outbreaks varies and can be from months to years. °Genital herpes can affect anyone. It is particularly concerning for pregnant women because the virus can be passed to the baby during delivery and can cause serious problems. Genital herpes is also a concern for people who have a weak disease-fighting system (immune system). °What are the causes? °This condition is caused by the human herpesvirus, also called herpes simplex virus, type 1 or type 2 (HSV-1 or HSV-2). The virus may spread through: °Sexual contact with an infected person, including vaginal, anal, and oral sex. °Contact with fluid from a herpes sore. °The skin. This means that you can get herpes from an infected partner even if there are no blisters or sores present. Your partner may not know that he or she is infected. °What increases the risk? °You are more likely to develop this condition if: °You have sex with many partners. °You do not use latex condoms during sex. °What are the signs or symptoms? °Most people do not have symptoms (are asymptomatic), or they have mild symptoms that may be mistaken for other skin problems. Symptoms may include: °Small, red bumps near the genitals, rectum, or mouth. These bumps turn into blisters and then sores. °Flu-like symptoms, including: °Fever. °Body aches. °Swollen lymph nodes. °Headache. °Painful urination. °Pain and itching in the genital area or rectal area. °Vaginal discharge. °Tingling or shooting pain in the legs and buttocks. °Generally, symptoms are more severe and last  longer during the first (primary) outbreak. Flu-like symptoms are also more common during the primary outbreak. °How is this diagnosed? °This condition may be diagnosed based on: °A physical exam. °Your medical history. °Blood tests. °A test of a fluid sample (culture) from an open sore. °How is this treated? °There is no cure for this condition, but treatment with antiviral medicines that are taken by mouth (orally) can do the following: °Speed up healing and relieve symptoms. °Help to reduce the spread of the virus to sexual partners. °Limit the chance of future outbreaks, or make future outbreaks shorter. °Lessen symptoms of future outbreaks. °Your health care provider may also recommend pain relief medicines, such as aspirin or ibuprofen. °Follow these instructions at home: °If you have an outbreak: °Keep the affected areas dry and clean. °Avoid rubbing or touching blisters and sores. If you do touch blisters or sores: °Wash your hands thoroughly with soap and water. °Do not touch your eyes afterward. °To help relieve pain or itching, you may take the following actions as told by your health care provider: °Apply a cold, wet cloth (cold compress) to affected areas 4-6 times a day. °Apply a substance that protects your skin and reduces bleeding (astringent). °Apply a gel that helps relieve pain around sores (lidocaine gel). °Take a warm, shallow bath that cleans the genital area (sitz bath). °Sexual activity °Do not have sexual contact during active outbreaks. °Practice safe sex. Herpes can spread even if your partner does not have blisters or sores. Latex condoms and female condoms may help prevent the spread of the herpes virus. °General instructions °Take over-the-counter and   prescription medicines only as told by your health care provider. °Keep all follow-up visits as told by your health care provider. This is important. °How is this prevented? °Use condoms. Although you can get genital herpes during sexual  contact even with the use of a condom, a condom can provide some protection. °Avoid having multiple sexual partners. °Talk with your sexual partner about any symptoms either of you may have. Also, talk with your partner about any history of STIs. °Get tested for STIs before you have sex. Ask your partner to do the same. °Do not have sexual contact if you have active symptoms of genital herpes. °Contact a health care provider if: °Your symptoms are not improving with medicine. °Your symptoms return, or you have new symptoms. °You have a fever. °You have abdominal pain. °You have redness, swelling, or pain in your eye. °You notice new sores on other parts of your body. °You are a woman and you experience bleeding between menstrual periods. °You have had herpes and you become pregnant or plan to become pregnant. °Summary °Genital herpes is a common sexually transmitted infection (STI) that is caused by the herpes simplex virus, type 1 or type 2 (HSV-1 or HSV-2). °These viruses are most often spread through sexual contact with an infected person. °You are more likely to develop this condition if you have sex with many partners or you do not use condoms during sex. °Most people do not have symptoms (are asymptomatic) or have mild symptoms that may be mistaken for other skin problems. Symptoms occur as outbreaks that may happen months or years apart. °There is no cure for this condition, but treatment with oral antiviral medicines can reduce symptoms, reduce the chance of spreading the virus to a partner, prevent future outbreaks, or shorten future outbreaks. °This information is not intended to replace advice given to you by your health care provider. Make sure you discuss any questions you have with your health care provider. °Document Revised: 02/03/2021 Document Reviewed: 02/02/2019 °Elsevier Patient Education © 2022 Elsevier Inc. ° °

## 2021-05-28 LAB — ANEMIA PROFILE B
Basophils Absolute: 0 10*3/uL (ref 0.0–0.2)
Basos: 0 %
EOS (ABSOLUTE): 0.2 10*3/uL (ref 0.0–0.4)
Eos: 2 %
Ferritin: 65 ng/mL (ref 15–150)
Folate: 12.9 ng/mL (ref >3.0)
Hematocrit: 43.6 % (ref 34.0–46.6)
Hemoglobin: 14.8 g/dL (ref 11.1–15.9)
Immature Grans (Abs): 0 10*3/uL (ref 0.0–0.1)
Immature Granulocytes: 0 %
Iron Saturation: 43 % (ref 15–55)
Iron: 126 ug/dL (ref 27–159)
Lymphocytes Absolute: 1.7 10*3/uL (ref 0.7–3.1)
Lymphs: 23 %
MCH: 32.4 pg (ref 26.6–33.0)
MCHC: 33.9 g/dL (ref 31.5–35.7)
MCV: 95 fL (ref 79–97)
Monocytes Absolute: 0.5 10*3/uL (ref 0.1–0.9)
Monocytes: 8 %
Neutrophils Absolute: 4.7 10*3/uL (ref 1.4–7.0)
Neutrophils: 67 %
Platelets: 222 10*3/uL (ref 150–450)
RBC: 4.57 x10E6/uL (ref 3.77–5.28)
RDW: 11.7 % (ref 11.7–15.4)
Retic Ct Pct: 1.5 % (ref 0.6–2.6)
Total Iron Binding Capacity: 294 ug/dL (ref 250–450)
UIBC: 168 ug/dL (ref 131–425)
Vitamin B-12: 272 pg/mL (ref 232–1245)
WBC: 7.1 10*3/uL (ref 3.4–10.8)

## 2021-05-28 LAB — HSV(HERPES SIMPLEX VRS) I + II AB-IGG
HSV 1 Glycoprotein G Ab, IgG: 3.45 index — ABNORMAL HIGH (ref 0.00–0.90)
HSV 2 IgG, Type Spec: <0.91 index (ref 0.00–0.90)

## 2021-05-29 ENCOUNTER — Telehealth: Payer: Self-pay | Admitting: Nurse Practitioner

## 2021-05-29 LAB — HERPES SIMPLEX VIRUS CULTURE

## 2021-05-30 NOTE — Telephone Encounter (Signed)
Returned patients call and patient states she already reviewed her labs on mychart.

## 2021-06-11 ENCOUNTER — Ambulatory Visit: Payer: PPO | Admitting: Neurology

## 2021-07-15 ENCOUNTER — Ambulatory Visit (INDEPENDENT_AMBULATORY_CARE_PROVIDER_SITE_OTHER): Payer: PPO | Admitting: Nurse Practitioner

## 2021-07-15 ENCOUNTER — Encounter: Payer: Self-pay | Admitting: Nurse Practitioner

## 2021-07-15 DIAGNOSIS — J0101 Acute recurrent maxillary sinusitis: Secondary | ICD-10-CM

## 2021-07-15 MED ORDER — PREDNISONE 20 MG PO TABS: 40.0000 mg | ORAL_TABLET | Freq: Every day | ORAL | 0 refills | Status: AC

## 2021-07-15 MED ORDER — LEVOFLOXACIN 500 MG PO TABS: 500.0000 mg | ORAL_TABLET | Freq: Every day | ORAL | 0 refills | Status: DC

## 2021-07-15 NOTE — Progress Notes (Signed)
Virtual Visit  Note Due to COVID-19 pandemic this visit was conducted virtually. This visit type was conducted due to national recommendations for restrictions regarding the COVID-19 Pandemic (e.g. social distancing, sheltering in place) in an effort to limit this patient's exposure and mitigate transmission in our community. All issues noted in this document were discussed and addressed.  A physical exam was not performed with this format.  I connected with Cheryl Teresa Reeves on 07/15/21 at 10:57 by telephone and verified that I am speaking with the correct person using two identifiers. Cheryl Reeves is currently located at home and no one is currently with  her during visit. The provider, Mary-Margaret Hassell Done, FNP is located in their office at time of visit.  I discussed the limitations, risks, security and privacy concerns of performing an evaluation and management service by telephone and the availability of in person appointments. I also discussed with the patient that there may be a patient responsible charge related to this service. The patient expressed understanding and agreed to proceed.   History and Present Illness:  URI  This is a new problem. The current episode started 1 to 4 weeks ago. The problem has been waxing and waning. There has been no fever. Associated symptoms include congestion, coughing, headaches, rhinorrhea and sinus pain. She has tried decongestant for the symptoms. The treatment provided mild relief.     Review of Systems  HENT:  Positive for congestion, rhinorrhea and sinus pain.   Respiratory:  Positive for cough.   Neurological:  Positive for headaches.    Observations/Objective: Alert and oriented- answers all questions appropriately No distress Raspy voice Nasal congestion noted Dry cough  Assessment and Plan: Cheryl Reeves in today with chief complaint of No chief complaint on file.   1. Acute recurrent maxillary sinusitis 1. Take  meds as prescribed 2. Use a cool mist humidifier especially during the winter months and when heat has been humid. 3. Use saline nose sprays frequently 4. Saline irrigations of the nose can be very helpful if done frequently.  * 4X daily for 1 week*  * Use of a nettie pot can be helpful with this. Follow directions with this* 5. Drink plenty of fluids 6. Keep thermostat turn down low 7.For any cough or congestion- mucinex OTC 8. For fever or aces or pains- take tylenol or ibuprofen appropriate for age and weight.  * for fevers greater than 101 orally you may alternate ibuprofen and tylenol every  3 hours.    - levofloxacin (LEVAQUIN) 500 MG tablet; Take 1 tablet (500 mg total) by mouth daily.  Dispense: 7 tablet; Refill: 0 - predniSONE (DELTASONE) 20 MG tablet; Take 2 tablets (40 mg total) by mouth daily with breakfast for 5 days. 2 po daily for 5 days  Dispense: 10 tablet; Refill: 0    Follow Up Instructions: prn    I discussed the assessment and treatment plan with the patient. The patient was provided an opportunity to ask questions and all were answered. The patient agreed with the plan and demonstrated an understanding of the instructions.   The patient was advised to call back or seek an in-person evaluation if the symptoms worsen or if the condition fails to improve as anticipated.  The above assessment and management plan was discussed with the patient. The patient verbalized understanding of and has agreed to the management plan. Patient is aware to call the clinic if symptoms persist or worsen. Patient is aware when to return  to the clinic for a follow-up visit. Patient educated on when it is appropriate to go to the emergency department.   Time call ended:  11:10  I provided 13 minutes of  non face-to-face time during this encounter.    Mary-Margaret Hassell Done, FNP

## 2021-08-25 ENCOUNTER — Ambulatory Visit (INDEPENDENT_AMBULATORY_CARE_PROVIDER_SITE_OTHER): Payer: PPO

## 2021-08-25 VITALS — Ht 69.0 in | Wt 149.0 lb

## 2021-08-25 DIAGNOSIS — Z1211 Encounter for screening for malignant neoplasm of colon: Secondary | ICD-10-CM | POA: Diagnosis not present

## 2021-08-25 DIAGNOSIS — Z0000 Encounter for general adult medical examination without abnormal findings: Secondary | ICD-10-CM | POA: Diagnosis not present

## 2021-08-25 DIAGNOSIS — Z1212 Encounter for screening for malignant neoplasm of rectum: Secondary | ICD-10-CM

## 2021-08-25 NOTE — Patient Instructions (Signed)
Cheryl Reeves , ?Thank you for taking time to come for your Medicare Wellness Visit. I appreciate your ongoing commitment to your health goals. Please review the following plan we discussed and let me know if I can assist you in the future.  ? ?Screening recommendations/referrals: ?Colonoscopy: Cologuard order placed today.  ?Mammogram: Done 01/01/2021 Repeat annually ? ?Bone Density: Not due until age 51. ? ?Recommended yearly ophthalmology/optometry visit for glaucoma screening and checkup ?Recommended yearly dental visit for hygiene and checkup ? ?Vaccinations: ?Influenza vaccine: Declined.  ?Pneumococcal vaccine: Not due until age 78. ?Tdap vaccine: Done 09/01/2017 Repeat in 10 years ? ?Shingles vaccine: Discussed.  ?Covid-19: Declined. ? ?Advanced directives: Advance directive discussed with you today. I have provided a copy for you to complete at home and have notarized. Once this is complete please bring a copy in to our office so we can scan it into your chart. ?PAPERWORK CAN BE PICKED UP AT THE OFFICE AT YOUR NEXT VISIT. ? ?Conditions/risks identified: Aim for 30 minutes of exercise or brisk walking, 6-8 glasses of water, and 5 servings of fruits and vegetables each day. ? ? ?Next appointment: Follow up in one year for your annual wellness visit. 2024. ? ?Preventive Care 40-64 Years, Female ?Preventive care refers to lifestyle choices and visits with your health care provider that can promote health and wellness. ?What does preventive care include? ?A yearly physical exam. This is also called an annual well check. ?Dental exams once or twice a year. ?Routine eye exams. Ask your health care provider how often you should have your eyes checked. ?Personal lifestyle choices, including: ?Daily care of your teeth and gums. ?Regular physical activity. ?Eating a healthy diet. ?Avoiding tobacco and drug use. ?Limiting alcohol use. ?Practicing safe sex. ?Taking low-dose aspirin daily starting at age 62. ?Taking vitamin  and mineral supplements as recommended by your health care provider. ?What happens during an annual well check? ?The services and screenings done by your health care provider during your annual well check will depend on your age, overall health, lifestyle risk factors, and family history of disease. ?Counseling  ?Your health care provider may ask you questions about your: ?Alcohol use. ?Tobacco use. ?Drug use. ?Emotional well-being. ?Home and relationship well-being. ?Sexual activity. ?Eating habits. ?Work and work Statistician. ?Method of birth control. ?Menstrual cycle. ?Pregnancy history. ?Screening  ?You may have the following tests or measurements: ?Height, weight, and BMI. ?Blood pressure. ?Lipid and cholesterol levels. These may be checked every 5 years, or more frequently if you are over 68 years old. ?Skin check. ?Lung cancer screening. You may have this screening every year starting at age 98 if you have a 30-pack-year history of smoking and currently smoke or have quit within the past 15 years. ?Fecal occult blood test (FOBT) of the stool. You may have this test every year starting at age 35. ?Flexible sigmoidoscopy or colonoscopy. You may have a sigmoidoscopy every 5 years or a colonoscopy every 10 years starting at age 46. ?Hepatitis C blood test. ?Hepatitis B blood test. ?Sexually transmitted disease (STD) testing. ?Diabetes screening. This is done by checking your blood sugar (glucose) after you have not eaten for a while (fasting). You may have this done every 1-3 years. ?Mammogram. This may be done every 1-2 years. Talk to your health care provider about when you should start having regular mammograms. This may depend on whether you have a family history of breast cancer. ?BRCA-related cancer screening. This may be done if you have a family  history of breast, ovarian, tubal, or peritoneal cancers. ?Pelvic exam and Pap test. This may be done every 3 years starting at age 42. Starting at age 54, this  may be done every 5 years if you have a Pap test in combination with an HPV test. ?Bone density scan. This is done to screen for osteoporosis. You may have this scan if you are at high risk for osteoporosis. ?Discuss your test results, treatment options, and if necessary, the need for more tests with your health care provider. ?Vaccines  ?Your health care provider may recommend certain vaccines, such as: ?Influenza vaccine. This is recommended every year. ?Tetanus, diphtheria, and acellular pertussis (Tdap, Td) vaccine. You may need a Td booster every 10 years. ?Zoster vaccine. You may need this after age 19. ?Pneumococcal 13-valent conjugate (PCV13) vaccine. You may need this if you have certain conditions and were not previously vaccinated. ?Pneumococcal polysaccharide (PPSV23) vaccine. You may need one or two doses if you smoke cigarettes or if you have certain conditions. ?Talk to your health care provider about which screenings and vaccines you need and how often you need them. ?This information is not intended to replace advice given to you by your health care provider. Make sure you discuss any questions you have with your health care provider. ?Document Released: 06/21/2015 Document Revised: 02/12/2016 Document Reviewed: 03/26/2015 ?Elsevier Interactive Patient Education ? 2017 [employer] ? ? ? ?Fall Prevention in the Home ?Falls can cause injuries. They can happen to people of all ages. There are many things you can do to make your home safe and to help prevent falls. ?What can I do on the outside of my home? ?Regularly fix the edges of walkways and driveways and fix any cracks. ?Remove anything that might make you trip as you walk through a door, such as a raised step or threshold. ?Trim any bushes or trees on the path to your home. ?Use bright outdoor lighting. ?Clear any walking paths of anything that might make someone trip, such as rocks or tools. ?Regularly check to see if handrails are loose or  broken. Make sure that both sides of any steps have handrails. ?Any raised decks and porches should have guardrails on the edges. ?Have any leaves, snow, or ice cleared regularly. ?Use sand or salt on walking paths during winter. ?Clean up any spills in your garage right away. This includes oil or grease spills. ?What can I do in the bathroom? ?Use night lights. ?Install grab bars by the toilet and in the tub and shower. Do not use towel bars as grab bars. ?Use non-skid mats or decals in the tub or shower. ?If you need to sit down in the shower, use a plastic, non-slip stool. ?Keep the floor dry. Clean up any water that spills on the floor as soon as it happens. ?Remove soap buildup in the tub or shower regularly. ?Attach bath mats securely with double-sided non-slip rug tape. ?Do not have throw rugs and other things on the floor that can make you trip. ?What can I do in the bedroom? ?Use night lights. ?Make sure that you have a light by your bed that is easy to reach. ?Do not use any sheets or blankets that are too big for your bed. They should not hang down onto the floor. ?Have a firm chair that has side arms. You can use this for support while you get dressed. ?Do not have throw rugs and other things on the floor that can make you  trip. ?What can I do in the kitchen? ?Clean up any spills right away. ?Avoid walking on wet floors. ?Keep items that you use a lot in easy-to-reach places. ?If you need to reach something above you, use a strong step stool that has a grab bar. ?Keep electrical cords out of the way. ?Do not use floor polish or wax that makes floors slippery. If you must use wax, use non-skid floor wax. ?Do not have throw rugs and other things on the floor that can make you trip. ?What can I do with my stairs? ?Do not leave any items on the stairs. ?Make sure that there are handrails on both sides of the stairs and use them. Fix handrails that are broken or loose. Make sure that handrails are as long as  the stairways. ?Check any carpeting to make sure that it is firmly attached to the stairs. Fix any carpet that is loose or worn. ?Avoid having throw rugs at the top or bottom of the stairs. If you do have

## 2021-08-25 NOTE — Progress Notes (Signed)
? ?Subjective:  ? Cheryl Reeves is a 51 y.o. female who presents for Medicare Annual (Subsequent) preventive examination. ?Virtual Visit via Telephone Note ? ?I connected with  Cheryl Reeves on 08/25/21 at  2:45 PM EDT by telephone and verified that I am speaking with the correct person using two identifiers. ? ?Location: ?Patient: HOME ?Provider: [facility] ?Persons participating in the virtual visit: patient/Nurse Health Advisor ?  ?I discussed the limitations, risks, security and privacy concerns of performing an evaluation and management service by telephone and the availability of in person appointments. The patient expressed understanding and agreed to proceed. ? ?Interactive audio and video telecommunications were attempted between this nurse and patient, however failed, due to patient having technical difficulties OR patient did not have access to video capability.  We continued and completed visit with audio only. ? ?Some vital signs may be absent or patient reported.  ? ?Chriss Driver, LPN ? ?Review of Systems    ? ?Cardiac Risk Factors include: smoking/ tobacco exposure;sedentary lifestyle;Other (see comment), Risk factor comments: CVA, SEIZURE DISORDER, R FOOT DROP. ? ?   ?Objective:  ?  ?Today's Vitals  ? 08/25/21 1444  ?Weight: 149 lb (67.6 kg)  ?Height: 5\' 9"  (1.753 m)  ? ?Body mass index is 22 kg/m?. ? ?Advanced Directives 08/25/2021 01/17/2021 01/16/2021 01/07/2021 11/07/2020 08/13/2020 02/24/2018  ?Does Patient Have a Medical Advance Directive? No No No No No No No  ?Would patient like information on creating a medical advance directive? No - Patient declined No - Patient declined No - Patient declined - - No - Patient declined Yes (MAU/Ambulatory/Procedural Areas - Information given)  ? ? ?Current Medications (verified) ?Outpatient Encounter Medications as of 08/25/2021  ?Medication Sig  ? aspirin 81 MG tablet Take 81 mg by mouth daily.  ? atorvastatin (LIPITOR) 20 MG tablet Take 1 tablet (20 mg  total) by mouth daily.  ? baclofen (LIORESAL) 10 MG tablet Take 1 tablet (10 mg total) by mouth 2 (two) times daily.  ? buPROPion (WELLBUTRIN XL) 300 MG 24 hr tablet Take 1 tablet (300 mg total) by mouth daily. Needs appointment for further refills  ? butalbital-acetaminophen-caffeine (FIORICET) 50-325-40 MG tablet Take 1 tablet by mouth every 6 (six) hours as needed for headache.  ? fluticasone (FLONASE) 50 MCG/ACT nasal spray Place 2 sprays into both nostrils daily.  ? hydrOXYzine (VISTARIL) 25 MG capsule Take 1 capsule (25 mg total) by mouth 2 (two) times daily as needed.  ? ibuprofen (ADVIL) 800 MG tablet Take 1 tablet (800 mg total) by mouth every 8 (eight) hours as needed for moderate pain.  ? levofloxacin (LEVAQUIN) 500 MG tablet Take 1 tablet (500 mg total) by mouth daily.  ? lisinopril (ZESTRIL) 20 MG tablet Take 1 tablet (20 mg total) by mouth daily. TAKE ONE (1) TABLET EACH DAY  ? ondansetron (ZOFRAN ODT) 4 MG disintegrating tablet Take 1 tablet (4 mg total) by mouth every 8 (eight) hours as needed for nausea or vomiting.  ? pantoprazole (PROTONIX) 40 MG tablet Take 1 tablet (40 mg total) by mouth daily.  ? sulfamethoxazole-trimethoprim (BACTRIM DS) 800-160 MG tablet Take 1 tablet by mouth 2 (two) times daily.  ? zonisamide (ZONEGRAN) 100 MG capsule Take 2 capsules (200 mg total) by mouth at bedtime.  ? ?Facility-Administered Encounter Medications as of 08/25/2021  ?Medication  ? incobotulinumtoxinA (XEOMIN) 100 units injection 300 Units  ? ? ?Allergies (verified) ?Codeine  ? ?History: ?Past Medical History:  ?Diagnosis Date  ?  Allergy   ? DJD (degenerative joint disease)   ? GERD (gastroesophageal reflux disease)   ? Headache   ? Stroke [facility]) 1999  ? S/p childbirth-weakness rt leg  ? Thoracic injuries   ? Wears glasses   ? ?Past Surgical History:  ?Procedure Laterality Date  ? ARTHROSCOPY KNEE W/ DRILLING  2008  ? rt  ? REPLACEMENT TOTAL KNEE Right 2009  ? SEPTOPLASTY Bilateral 07/21/2013  ? Procedure:  SEPTOPLASTY;  Surgeon: Melissa Montane, MD;  Location: [place];  Service: ENT;  Laterality: Bilateral;  ? SINUS ENDO W/FUSION Bilateral 07/21/2013  ? Procedure: ENDOSCOPIC SINUS SURGERY WITH FUSION NAVIGATION;  Surgeon: Melissa Montane, MD;  Location: [place];  Service: ENT;  Laterality: Bilateral;  ? TUBAL LIGATION  2000  ? ?Family History  ?Problem Relation Age of Onset  ? Cancer Mother   ?     lung  ? Arthritis Mother   ? Heart disease Father   ? Cancer Father   ?     lung   ? Hypertension Father   ? Heart disease Brother   ? Hypertension Brother   ? Diabetes Other   ? Arthritis/Rheumatoid Other   ? Breast cancer Neg Hx   ? ?Social History  ? ?Socioeconomic History  ? Marital status: Married  ?  Spouse name: Shirlean Mylar  ? Number of children: 2  ? Years of education: 10  ? Highest education level: 10th grade  ?Occupational History  ? Occupation: Disabled  ?Tobacco Use  ? Smoking status: Every Day  ?  Packs/day: 0.50  ?  Years: 30.00  ?  Pack years: 15.00  ?  Types: Cigarettes  ? Smokeless tobacco: Never  ?Vaping Use  ? Vaping Use: Never used  ?Substance and Sexual Activity  ? Alcohol use: Yes  ?  Alcohol/week: 4.0 standard drinks  ?  Types: 4 Cans of beer per week  ?  Comment: occ  ? Drug use: Yes  ?  Types: Marijuana  ?  Comment: occasionally  ? Sexual activity: Yes  ?Other Topics Concern  ? Not on file  ?Social History Narrative  ? Lives at home with son  ? Married, 1 son and 1 stepson , 1 daughter   ? Right handed  ? 10 th grade  ? 3-4 cups caffeine per day.  ? ?Social Determinants of Health  ? ?Financial Resource Strain: Low Risk   ? Difficulty of Paying Living Expenses: Not hard at all  ?Food Insecurity: No Food Insecurity  ? Worried About Charity fundraiser in the Last Year: Never true  ? Ran Out of Food in the Last Year: Never true  ?Transportation Needs: Unknown  ? Lack of Transportation (Medical): No  ? Lack of Transportation (Non-Medical): Not on file  ?Physical Activity: Inactive  ?  Days of Exercise per Week: 0 days  ? Minutes of Exercise per Session: 0 min  ?Stress: No Stress Concern Present  ? Feeling of Stress : Not at all  ?Social Connections: Moderately Integrated  ? Frequency of Communication with Friends and Family: More than three times a week  ? Frequency of Social Gatherings with Friends and Family: More than three times a week  ? Attends Religious Services: 1 to 4 times per year  ? Active Member of Clubs or Organizations: No  ? Attends Archivist Meetings: Never  ? Marital Status: Married  ? ? ?Tobacco Counseling ?Ready to quit: Not Answered ?Counseling given: Not Answered ? ? ?  Clinical Intake: ? ?Pre-visit preparation completed: Yes ? ?Pain : No/denies pain ? ?  ? ?BMI - recorded: 22 ?Nutritional Status: BMI of 19-24  Normal ?Nutritional Risks: None ?Diabetes: No ? ?How often do you need to have someone help you when you read instructions, pamphlets, or other written materials from your doctor or pharmacy?: 1 - Never ? ?Diabetic?NO ? ?Interpreter Needed?: No ? ?Information entered by :: mj Jeilani Grupe, lpn ? ? ?Activities of Daily Living ?In your present state of health, do you have any difficulty performing the following activities: 08/25/2021 01/17/2021  ?Hearing? N N  ?Vision? N N  ?Difficulty concentrating or making decisions? N N  ?Walking or climbing stairs? N Y  ?Dressing or bathing? N N  ?Doing errands, shopping? N N  ?Preparing Food and eating ? N -  ?Using the Toilet? N -  ?In the past six months, have you accidently leaked urine? Y -  ?Comment Stress incontinence. -  ?Do you have problems with loss of bowel control? N -  ?Managing your Medications? N -  ?Managing your Finances? N -  ?Housekeeping or managing your Housekeeping? N -  ?Some recent data might be hidden  ? ? ?Patient Care Team: ?[name], FNP as PCP - General (Family Medicine) ?Marcial Pacas, MD as Consulting Physician (Neurology) ?Ilean China, RN as Equities trader ? ?Indicate any recent  Medical Services you may have received from other than [facility] providers in the past year (date may be approximate). ? ?   ?Assessment:  ? This is a routine wellness examination for Cheryl Reeves. ? ?Hearing/Vision scre

## 2021-08-26 ENCOUNTER — Other Ambulatory Visit: Payer: Self-pay | Admitting: Nurse Practitioner

## 2021-08-26 DIAGNOSIS — I6930 Unspecified sequelae of cerebral infarction: Secondary | ICD-10-CM

## 2021-08-26 NOTE — Telephone Encounter (Signed)
Last office visit 2/723 ?Last refil 03/24/21, #60, 2 refills ?

## 2021-09-03 ENCOUNTER — Ambulatory Visit: Payer: PPO | Admitting: Neurology

## 2021-09-03 DIAGNOSIS — I639 Cerebral infarction, unspecified: Secondary | ICD-10-CM

## 2021-09-03 DIAGNOSIS — G8111 Spastic hemiplegia affecting right dominant side: Secondary | ICD-10-CM

## 2021-09-03 NOTE — Progress Notes (Signed)
XEOMIN 100 UNITS X 3 vials  ?[id number] ?[id number] ?Exp-08/2023 ?B/B ?

## 2021-09-03 NOTE — Progress Notes (Signed)
? ?Chief Complaint  ?Patient presents with  ? Procedure  ?  XEOMIN  ? ? ? ? ?ASSESSMENT AND PLAN ? ?Cheryl Reeves is a 51 y.o. female   ?Seizure ? 1 seizure was during her stroke, recurrent seizure couple years later, has been on stable dose of carbamazepine 200 mg twice a day, will change to Zonegran 200 mg every night to simplify the regimen, and also benefit for migraine ? ?Chronic migraine ? Zonegran 200 mg every night as preventive medication ? Not a good candidate for triptan, tried Roselyn Meier of 50 mg multiple times does not help, currently frequent use of over-the-counter medication including Claritin-D, I have suggested to stop, which has Sudafed, vasoconstriction component ? Try Fioricet as needed, limit the use to less than twice each week, may combine with Zofran, Aleve, even baclofen for severe prolonged headaches ? ? ?History of stroke in 1999, with residual right hemiparesis, ? She complains of increased right lower extremity spasticity, frequent spastic right ankle plantarflexion ? Is on baclofen 10 mg twice a day ? ?Electrical stimulation guided xeomin injection.  Used Xeomin 300 units today ?Right tibialis posterior 100 units ?Right flexor digitorum longus 50 units ?Right medial gastrocnemius 50 units ?Right adductor longus 50 units ?Right adductor magnus 50 units ?Spastic right hemiparesis ? ?MEDICAL HISTORY: ?Cheryl Reeves is a 51 years old right-handed female, seen in refer by her primary care physician Dr. Redge Gainer for evaluation of frequent headaches, last visit was with Dr. Janann Colonel in 2015 ?  ?She suffered stroke in 1999, was attributed to post partum hemorrhage, with mild aphasia, mild right upper extremity weakness, spastic gait right lower extremity weakness, she also had one seizure when she had a stroke, recurrent seizure in 2001, taking Tegretol 200 mg twice a day ?  ?She has frequent headaches even before she had stroke, contributed her sinus headache, had severe headache the day  she had stroke, and persistent almost daily headache ever since, since 2014, she has moderate sinus pressure headaches, has been taking daily ibuprofen 10-12 tablets each day, has tried different over-the-counter sinus medications without helping her symptoms. ?  ?She never tried preventive medications in the past, she complains of excessive stress, her son just moved out from her house to live with her ex-husband, has difficulty sleeping, is taking Wellbutrin 150 mg every morning ?I have personally reviewed MRI of the brain in 2007, left frontal encephalomalacia ?  ?UPDATE Feb 25 2015:She only take one nortirptyline 25mg  qhs, instead of 2 tablets every night, which has been helpful, she no longer has constant headaches, but she has to take ibuprofen up to 5 tablets each time for her right side low back pain, she complains of right hip, low back pain, related to her abnormal gait, she denies shooting pain to her right lower extremity ?  ?UPDATE September 19 2019: ?She has lost follow-up since last visit in June 2018, she came back complains of worsening headaches, up to 5 days each week, reviewing previous record nortriptyline up to 50 mg every night has been helpful, ?  ?She thought her migraine headache is sinus related, over the years, she has self medicated with over-the-counter sinus medications including daily Sudafed, multiple dose, Claritin, Mucinex sinus, Alka-Seltzer, ibuprofen up to 15 tablets daily, she complains of daily severe headaches, not function well because of significant headaches, holocranial, pressure, behind eye pain, light noise sensitivity ?  ?I personally reviewed CT head without contrast in March 2019 showed stable left frontal  encephalomalacia and porencephaly, no acute intracranial abnormality ?CT of cervical spine showed multilevel degenerative disc disease, ?  ?MRI of brain in 2007:  No acute intracranial abnormality.  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   ?  ?Laboratory evaluation in 2020,  elevated LDL 114, cholesterol 220, CMP showed mild decrease sodium 127, ?  ?UPDATE January 27 2021: ?Today she returns to evaluate Botox injection for increased to right lower extremity spasticity, tendency for right ankle with plantarflexion, tried physical therapy, helped some ? ?She still complains a lot of headache about twice a week, lasting all day, she take frequent Claritin-D, and ibuprofen, which only provide partial help, recently tried [place] as needed was not helping her headache ? ?Was not sure the benefit of Effexor XR 37.5 mg, she continued Tegretol-XR 200 mg twice a day as seizure prevention, has not had seizure for a long time, ? ?UPDATE February 03 2021: ?We used xeomin 100 units x3 for today's injection to right lower extremity, she responded well to previous injection in August 2022, no side effect noted ? ?PHYSICAL EXAM: ? ? ?MOTOR: Spastic right hemiparesis, fixation of right upper extremity upon rapid rotating movement, mild right hip flexion weakness, moderate right ankle dorsiflexion weakness, wearing right ankle brace, tendency for right ankle plantarflexion, also almost constant right toe extension ? ?REFLEXES: ?Hyperreflexia of right upper and lower extremity, right Babinski sign, ? ?GAIT/STANCE: Need push-up to get up from seated position, spastic right hemicircumferential unsteady gait tendency for right ankle plantarflexion ? ? ?Marcial Pacas, M.D. Ph.D. ? ?[facility] ?[address], [address] ?[place], [place] [address] ?Ph: [phone]) [phone] ?Fax: [phone] ? ?CC:  [name], FNP ?[address] ?[place],  [place] [address]  [name], FNP   ?

## 2021-09-04 DIAGNOSIS — Z1212 Encounter for screening for malignant neoplasm of rectum: Secondary | ICD-10-CM | POA: Diagnosis not present

## 2021-09-04 DIAGNOSIS — Z1211 Encounter for screening for malignant neoplasm of colon: Secondary | ICD-10-CM | POA: Diagnosis not present

## 2021-09-08 DIAGNOSIS — I639 Cerebral infarction, unspecified: Secondary | ICD-10-CM | POA: Insufficient documentation

## 2021-09-08 DIAGNOSIS — G8111 Spastic hemiplegia affecting right dominant side: Secondary | ICD-10-CM | POA: Insufficient documentation

## 2021-09-08 DIAGNOSIS — I6930 Unspecified sequelae of cerebral infarction: Secondary | ICD-10-CM | POA: Insufficient documentation

## 2021-09-08 MED ORDER — INCOBOTULINUMTOXINA 100 UNITS IM SOLR
300.0000 [IU] | INTRAMUSCULAR | Status: AC
  Administered 2021-09-08: 300 [IU] via INTRAMUSCULAR

## 2021-09-12 LAB — COLOGUARD: COLOGUARD: POSITIVE — AB

## 2021-09-15 ENCOUNTER — Other Ambulatory Visit: Payer: Self-pay

## 2021-09-15 DIAGNOSIS — R195 Other fecal abnormalities: Secondary | ICD-10-CM

## 2021-09-18 ENCOUNTER — Encounter: Payer: Self-pay | Admitting: Internal Medicine

## 2021-11-07 ENCOUNTER — Other Ambulatory Visit: Payer: Self-pay | Admitting: Nurse Practitioner

## 2021-11-07 DIAGNOSIS — F419 Anxiety disorder, unspecified: Secondary | ICD-10-CM

## 2021-11-11 ENCOUNTER — Encounter: Payer: Self-pay | Admitting: *Deleted

## 2021-11-25 ENCOUNTER — Ambulatory Visit: Payer: PPO

## 2021-12-04 ENCOUNTER — Ambulatory Visit (INDEPENDENT_AMBULATORY_CARE_PROVIDER_SITE_OTHER): Payer: PPO | Admitting: Neurology

## 2021-12-04 ENCOUNTER — Encounter: Payer: Self-pay | Admitting: Neurology

## 2021-12-04 VITALS — BP 145/85 | HR 89 | Ht 69.0 in | Wt 145.5 lb

## 2021-12-04 DIAGNOSIS — G8111 Spastic hemiplegia affecting right dominant side: Secondary | ICD-10-CM | POA: Diagnosis not present

## 2021-12-04 DIAGNOSIS — I639 Cerebral infarction, unspecified: Secondary | ICD-10-CM

## 2021-12-04 NOTE — Progress Notes (Signed)
Chief Complaint  Patient presents with   Procedure    Rm 14.      ASSESSMENT AND PLAN  Cheryl Reeves is a 51 y.o. female   Seizure  1 seizure was during her stroke, recurrent seizure couple years later, has been on stable dose of carbamazepine 200 mg twice a day, will change to Zonegran 200 mg every night to simplify the regimen, and also benefit for migraine  Chronic migraine  Zonegran 200 mg every night as preventive medication  Not a good candidate for triptan, tried Bernita Raisin of 50 mg multiple times does not help, currently frequent use of over-the-counter medication including Claritin-D, I have suggested to stop, which has Sudafed, vasoconstriction component  Try Fioricet as needed, limit the use to less than twice each week, may combine with Zofran, Aleve, even baclofen for severe prolonged headaches   History of stroke in 1999, with residual right hemiparesis,  She complains of increased right lower extremity spasticity, frequent spastic right ankle plantarflexion  Is on baclofen 10 mg twice a day  Electrical stimulation guided xeomin injection.  Used Xeomin 300 units t  Right tibialis posterior 100 units Right flexor digitorum longus 50 units Right medial gastrocnemius 50 units Right biceps femoris long head, 25 units Right biceps femoris short head 25 units Right semimembranes 25 units Right semitendinosus 25 units   MEDICAL HISTORY: Cheryl Reeves is a 51 years old right-handed female, seen in refer by her primary care physician Dr. Rudi Heap for evaluation of frequent headaches, last visit was with Dr. Hosie Poisson in 2015   She suffered stroke in 1999, was attributed to post partum hemorrhage, with mild aphasia, mild right upper extremity weakness, spastic gait right lower extremity weakness, she also had one seizure when she had a stroke, recurrent seizure in 2001, taking Tegretol 200 mg twice a day   She has frequent headaches even before she had stroke,  contributed her sinus headache, had severe headache the day she had stroke, and persistent almost daily headache ever since, since 2014, she has moderate sinus pressure headaches, has been taking daily ibuprofen 10-12 tablets each day, has tried different over-the-counter sinus medications without helping her symptoms.   She never tried preventive medications in the past, she complains of excessive stress, her son just moved out from her house to live with her ex-husband, has difficulty sleeping, is taking Wellbutrin 150 mg every morning I have personally reviewed MRI of the brain in 2007, left frontal encephalomalacia   UPDATE Feb 25 2015:She only take one nortirptyline 25mg  qhs, instead of 2 tablets every night, which has been helpful, she no longer has constant headaches, but she has to take ibuprofen up to 5 tablets each time for her right side low back pain, she complains of right hip, low back pain, related to her abnormal gait, she denies shooting pain to her right lower extremity   UPDATE September 19 2019: She has lost follow-up since last visit in June 2018, she came back complains of worsening headaches, up to 5 days each week, reviewing previous record nortriptyline up to 50 mg every night has been helpful,   She thought her migraine headache is sinus related, over the years, she has self medicated with over-the-counter sinus medications including daily Sudafed, multiple dose, Claritin, Mucinex sinus, Alka-Seltzer, ibuprofen up to 15 tablets daily, she complains of daily severe headaches, not function well because of significant headaches, holocranial, pressure, behind eye pain, light noise sensitivity   I personally  reviewed CT head without contrast in March 2019 showed stable left frontal encephalomalacia and porencephaly, no acute intracranial abnormality CT of cervical spine showed multilevel degenerative disc disease,   MRI of brain in 2007:  No acute intracranial abnormality.  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     Laboratory evaluation in 2020,  elevated LDL 114, cholesterol 220, CMP showed mild decrease sodium 127,   UPDATE January 27 2021: Today she returns to evaluate Botox injection for increased to right lower extremity spasticity, tendency for right ankle with plantarflexion, tried physical therapy, helped some  She still complains a lot of headache about twice a week, lasting all day, she take frequent Claritin-D, and ibuprofen, which only provide partial help, recently tried [place] as needed was not helping her headache  Was not sure the benefit of Effexor XR 37.5 mg, she continued Tegretol-XR 200 mg twice a day as seizure prevention, has not had seizure for a long time,  UPDATE February 03 2021: We used xeomin 100 units x3 for today's injection to right lower extremity, she responded well to previous injection in August 2022, no side effect noted  Update December 04, 2021 She tolerated previous injection well, did help her right leg spasticity,  PHYSICAL EXAM:   MOTOR: Spastic right hemiparesis, fixation of right upper extremity upon rapid rotating movement, mild right hip flexion weakness, moderate right ankle dorsiflexion weakness, wearing right ankle brace, tendency for right ankle plantarflexion, also almost constant right toe extension  REFLEXES: Hyperreflexia of right upper and lower extremity, right Babinski sign,  GAIT/STANCE: Need push-up to get up from seated position, spastic right hemicircumferential unsteady gait tendency for right ankle plantarflexion   Levert Feinstein, M.D. Ph.D.  [facility] [address] [place], [place] [address] Ph: [phone] Fax: [phone]  CC:  Bennie Pierini, FNP [address] [place],  [place] [address]  Bennie Pierini, FNP

## 2021-12-04 NOTE — Progress Notes (Signed)
Xeomin 100 units x 3 vials LOT #: [id number] EXP: 2025-06 NDC: [id number]  B/B

## 2021-12-05 DIAGNOSIS — I639 Cerebral infarction, unspecified: Secondary | ICD-10-CM | POA: Diagnosis not present

## 2021-12-05 DIAGNOSIS — G8111 Spastic hemiplegia affecting right dominant side: Secondary | ICD-10-CM | POA: Diagnosis not present

## 2021-12-05 MED ORDER — INCOBOTULINUMTOXINA 100 UNITS IM SOLR
300.0000 [IU] | INTRAMUSCULAR | Status: AC
  Administered 2021-12-05: 300 [IU] via INTRAMUSCULAR

## 2021-12-27 ENCOUNTER — Other Ambulatory Visit: Payer: Self-pay | Admitting: Nurse Practitioner

## 2021-12-27 DIAGNOSIS — F411 Generalized anxiety disorder: Secondary | ICD-10-CM

## 2022-01-02 ENCOUNTER — Other Ambulatory Visit: Payer: Self-pay | Admitting: Nurse Practitioner

## 2022-01-02 DIAGNOSIS — E7800 Pure hypercholesterolemia, unspecified: Secondary | ICD-10-CM

## 2022-01-09 ENCOUNTER — Other Ambulatory Visit: Payer: Self-pay | Admitting: Nurse Practitioner

## 2022-01-09 DIAGNOSIS — F419 Anxiety disorder, unspecified: Secondary | ICD-10-CM

## 2022-01-09 DIAGNOSIS — I6930 Unspecified sequelae of cerebral infarction: Secondary | ICD-10-CM

## 2022-01-15 ENCOUNTER — Ambulatory Visit: Payer: Self-pay | Admitting: *Deleted

## 2022-01-15 NOTE — Patient Instructions (Signed)
Cheryl Reeves  At some point during the past 4 years, I have worked with you through the Chronic Care Management Program (CCM) at [facility]. We have not worked together within the past 6 months.   Due to program changes I am removing myself from your care team.   If you are currently active with another CCM Team Member, you will remain active with them unless they reach out to you with additional information.   If you feel that you need services in the future,  please talk with your primary care provider and request a new referral for Care Management or Care Coordination services. This does not affect your status as a patient at [facility].   Thank you for allowing me to participate in your your healthcare journey.  Demetrios Loll, BSN, RN-BC Engineer, materials Dial: [phone]

## 2022-01-15 NOTE — Chronic Care Management (AMB) (Signed)
  Chronic Care Management   Note  01/15/2022 Name: Cheryl Reeves MRN: [id number] DOB: 1971-05-17   Due to changes in the Chronic Care Management program, I am removing myself as the RN Care Manager from the Care Team and closing any RN Care Management Care Plans. The patient has not worked with the Medical illustrator within the past 6 months. Patient was not scheduled to be followed by the RN Care Coordination nurse for [facility].   Patient does not have an open Care Plan with another CCM team member. Patient does have a current CCM referral placed since 10/06/21. CCM enrollment status changed to "not enrolled".   Patient's PCP can place a new referral if the they needs Care Management or Care Coordination services in the future.  Demetrios Loll, BSN, RN-BC Engineer, materials Dial: [phone]

## 2022-02-24 ENCOUNTER — Other Ambulatory Visit: Payer: Self-pay | Admitting: Neurology

## 2022-02-24 ENCOUNTER — Encounter: Payer: Self-pay | Admitting: Nurse Practitioner

## 2022-02-24 ENCOUNTER — Telehealth (INDEPENDENT_AMBULATORY_CARE_PROVIDER_SITE_OTHER): Payer: PPO | Admitting: Nurse Practitioner

## 2022-02-24 DIAGNOSIS — R569 Unspecified convulsions: Secondary | ICD-10-CM | POA: Diagnosis not present

## 2022-02-24 DIAGNOSIS — E7800 Pure hypercholesterolemia, unspecified: Secondary | ICD-10-CM

## 2022-02-24 DIAGNOSIS — K219 Gastro-esophageal reflux disease without esophagitis: Secondary | ICD-10-CM | POA: Diagnosis not present

## 2022-02-24 DIAGNOSIS — F411 Generalized anxiety disorder: Secondary | ICD-10-CM

## 2022-02-24 DIAGNOSIS — F419 Anxiety disorder, unspecified: Secondary | ICD-10-CM

## 2022-02-24 DIAGNOSIS — I6930 Unspecified sequelae of cerebral infarction: Secondary | ICD-10-CM | POA: Diagnosis not present

## 2022-02-24 DIAGNOSIS — M21371 Foot drop, right foot: Secondary | ICD-10-CM

## 2022-02-24 DIAGNOSIS — I639 Cerebral infarction, unspecified: Secondary | ICD-10-CM

## 2022-02-24 DIAGNOSIS — I10 Essential (primary) hypertension: Secondary | ICD-10-CM

## 2022-02-24 DIAGNOSIS — G43709 Chronic migraine without aura, not intractable, without status migrainosus: Secondary | ICD-10-CM

## 2022-02-24 MED ORDER — ATORVASTATIN CALCIUM 20 MG PO TABS: 20.0000 mg | ORAL_TABLET | Freq: Every day | ORAL | 0 refills | Status: DC

## 2022-02-24 MED ORDER — BACLOFEN 10 MG PO TABS: ORAL_TABLET | ORAL | 2 refills | Status: DC

## 2022-02-24 MED ORDER — LISINOPRIL 20 MG PO TABS: 20.0000 mg | ORAL_TABLET | Freq: Every day | ORAL | 0 refills | Status: DC

## 2022-02-24 MED ORDER — BUTALBITAL-APAP-CAFFEINE 50-325-40 MG PO TABS: 1.0000 | ORAL_TABLET | Freq: Four times a day (QID) | ORAL | 5 refills | Status: DC | PRN

## 2022-02-24 MED ORDER — BUPROPION HCL ER (XL) 300 MG PO TB24: 300.0000 mg | ORAL_TABLET | Freq: Every day | ORAL | 0 refills | Status: DC

## 2022-02-24 MED ORDER — HYDROXYZINE PAMOATE 25 MG PO CAPS: ORAL_CAPSULE | ORAL | 1 refills | Status: DC

## 2022-02-24 MED ORDER — PANTOPRAZOLE SODIUM 40 MG PO TBEC: 40.0000 mg | DELAYED_RELEASE_TABLET | Freq: Every day | ORAL | 1 refills | Status: DC

## 2022-02-24 MED ORDER — IBUPROFEN 800 MG PO TABS: 800.0000 mg | ORAL_TABLET | Freq: Three times a day (TID) | ORAL | 0 refills | Status: DC | PRN

## 2022-02-24 NOTE — Progress Notes (Signed)
Virtual Visit Consent   Cheryl Reeves, you are scheduled for a virtual visit with Mary-Margaret Daphine DeutscherMartin, FNP, a [facility] provider, today.     Just as with appointments in the office, your consent must be obtained to participate.  Your consent will be active for this visit and any virtual visit you may have with one of our providers in the next 365 days.     If you have a MyChart account, a copy of this consent can be sent to you electronically.  All virtual visits are billed to your insurance company just like a traditional visit in the office.    As this is a virtual visit, video technology does not allow for your provider to perform a traditional examination.  This may limit your provider's ability to fully assess your condition.  If your provider identifies any concerns that need to be evaluated in person or the need to arrange testing (such as labs, EKG, etc.), we will make arrangements to do so.     Although advances in technology are sophisticated, we cannot ensure that it will always work on either your end or our end.  If the connection with a video visit is poor, the visit may have to be switched to a telephone visit.  With either a video or telephone visit, we are not always able to ensure that we have a secure connection.     I need to obtain your verbal consent now.   Are you willing to proceed with your visit today? YES   Cheryl Reeves has provided verbal consent on 02/24/2022 for a virtual visit (video or telephone).   Mary-Margaret Daphine DeutscherMartin, FNP   Date: 02/24/2022 10:40 AM   Virtual Visit via Video Note   I, Mary-Margaret Daphine DeutscherMartin, connected with Cheryl Reeves ([id number], 11-18-1970) on 02/24/22 at 11:15 AM EDT by a video-enabled telemedicine application and verified that I am speaking with the correct person using two identifiers.  Location: Patient: Virtual Visit Location Patient: Home Provider: Virtual Visit Location Provider: Mobile   I discussed the  limitations of evaluation and management by telemedicine and the availability of in person appointments. The patient expressed understanding and agreed to proceed.    History of Present Illness: Cheryl Reeves is a 51 y.o. who identifies as a female who was assigned female at birth, and is being seen today for chronic follow up.  HPI:   Chief Complaint: No chief complaint on file.    HPI:  Cheryl Reeves is a 51 y.o. who identifies as a female who was assigned female at birth.   Social history: Lives with: her husband Work history: disability   Comes in today for follow up of the following chronic medical issues:  1. Cerebrovascular accident (CVA), unspecified mechanism (HCC) Has right sided weakness nad walks with a cane.   2. Chronic migraine without aura without status migrainosus, not intractable Has 1-2 a month but is tolerable  3. Gastroesophageal reflux disease, unspecified whether esophagitis present Takes protonix daily and is doing well.  4. Seizures (HCC) No recent  seizure activity. Sees neurology every year.   5. Foot drop, right Has on right side. Makes her gait unsteady.  6. Anxiety Takes vistaril when needed. She says that really helps. Most of her anxiety is due to her pain level.  New complaints: None today  Allergies  Allergen Reactions   Codeine     headaches   Outpatient Encounter Medications as of 02/24/2022  Medication Sig   aspirin 81 MG tablet Take 81 mg by mouth daily.   atorvastatin (LIPITOR) 20 MG tablet Take 1 tablet (20 mg total) by mouth daily. (NEEDS TO BE SEEN BEFORE NEXT REFILL)   baclofen (LIORESAL) 10 MG tablet TAKE ONE (1) TABLET BY MOUTH TWO (2) TIMES DAILY   buPROPion (WELLBUTRIN XL) 300 MG 24 hr tablet Take 1 tablet (300 mg total) by mouth daily. (NEEDS TO BE SEEN BEFORE NEXT REFILL)   butalbital-acetaminophen-caffeine (FIORICET) 50-325-40 MG tablet Take 1 tablet by mouth every 6 (six) hours as needed for headache.    fluticasone (FLONASE) 50 MCG/ACT nasal spray Place 2 sprays into both nostrils daily.   hydrOXYzine (VISTARIL) 25 MG capsule TAKE 1 CAPSULE BY MOUTH TWICE DAILY AS NEEDED   ibuprofen (ADVIL) 800 MG tablet Take 1 tablet (800 mg total) by mouth every 8 (eight) hours as needed for moderate pain.   levofloxacin (LEVAQUIN) 500 MG tablet Take 1 tablet (500 mg total) by mouth daily.   lisinopril (ZESTRIL) 20 MG tablet Take 1 tablet (20 mg total) by mouth daily. (NEEDS TO BE SEEN BEFORE NEXT REFILL)   ondansetron (ZOFRAN ODT) 4 MG disintegrating tablet Take 1 tablet (4 mg total) by mouth every 8 (eight) hours as needed for nausea or vomiting.   pantoprazole (PROTONIX) 40 MG tablet Take 1 tablet (40 mg total) by mouth daily.   zonisamide (ZONEGRAN) 100 MG capsule Take 2 capsules (200 mg total) by mouth at bedtime.   Facility-Administered Encounter Medications as of 02/24/2022  Medication   incobotulinumtoxinA (XEOMIN) 100 units injection 300 Units   incobotulinumtoxinA (XEOMIN) 100 units injection 300 Units    Past Surgical History:  Procedure Laterality Date   ARTHROSCOPY KNEE W/ DRILLING  2008   rt   REPLACEMENT TOTAL KNEE Right 2009   SEPTOPLASTY Bilateral 07/21/2013   Procedure: SEPTOPLASTY;  Surgeon: Melissa Montane, MD;  Location: [facility];  Service: ENT;  Laterality: Bilateral;   SINUS ENDO W/FUSION Bilateral 07/21/2013   Procedure: ENDOSCOPIC SINUS SURGERY WITH FUSION NAVIGATION;  Surgeon: Melissa Montane, MD;  Location: [facility];  Service: ENT;  Laterality: Bilateral;   TUBAL LIGATION  2000    Family History  Problem Relation Age of Onset   Cancer Mother        lung   Arthritis Mother    Heart disease Father    Cancer Father        lung    Hypertension Father    Heart disease Brother    Hypertension Brother    Diabetes Other    Arthritis/Rheumatoid Other    Breast cancer Neg Hx       Controlled substance contract: n/a  ROS  Problems:  Patient  Active Problem List   Diagnosis Date Noted   Spastic hemiparesis of right dominant side ([place]) 09/08/2021   Cerebrovascular accident (CVA) ([place]) 09/08/2021   Chronic migraine w/o aura w/o status migrainosus, not intractable 01/27/2021   Right spastic hemiplegia ([place]) 01/27/2021   Acute pyelonephritis 01/17/2021   Migraine 09/19/2019   History of multiple allergies 01/31/2018   Compression fracture of thoracic vertebra ([place]) 09/27/2017   Pain in thoracic spine 09/27/2017   Carpal tunnel syndrome of left wrist 10/16/2016   Allergic rhinitis 10/09/2013   GERD (gastroesophageal reflux disease) 10/09/2013   Late effect of cerebrovascular accident (CVA) 10/09/2013   Foot drop, right 10/09/2013   Rosacea, acne 10/09/2013   Seizures ([place]) 10/09/2013    Allergies:  Allergies  Allergen Reactions   Codeine     headaches   Medications:  Current Outpatient Medications:    aspirin 81 MG tablet, Take 81 mg by mouth daily., Disp: , Rfl:    atorvastatin (LIPITOR) 20 MG tablet, Take 1 tablet (20 mg total) by mouth daily. (NEEDS TO BE SEEN BEFORE NEXT REFILL), Disp: 30 tablet, Rfl: 0   baclofen (LIORESAL) 10 MG tablet, TAKE ONE (1) TABLET BY MOUTH TWO (2) TIMES DAILY, Disp: 60 each, Rfl: 2   buPROPion (WELLBUTRIN XL) 300 MG 24 hr tablet, Take 1 tablet (300 mg total) by mouth daily. (NEEDS TO BE SEEN BEFORE NEXT REFILL), Disp: 30 tablet, Rfl: 0   butalbital-acetaminophen-caffeine (FIORICET) 50-325-40 MG tablet, Take 1 tablet by mouth every 6 (six) hours as needed for headache., Disp: 12 tablet, Rfl: 5   fluticasone (FLONASE) 50 MCG/ACT nasal spray, Place 2 sprays into both nostrils daily., Disp: 16 g, Rfl: 6   hydrOXYzine (VISTARIL) 25 MG capsule, TAKE 1 CAPSULE BY MOUTH TWICE DAILY AS NEEDED, Disp: 60 capsule, Rfl: 0   ibuprofen (ADVIL) 800 MG tablet, Take 1 tablet (800 mg total) by mouth every 8 (eight) hours as needed for moderate pain., Disp: 21 tablet, Rfl: 0   levofloxacin (LEVAQUIN) 500 MG  tablet, Take 1 tablet (500 mg total) by mouth daily., Disp: 7 tablet, Rfl: 0   lisinopril (ZESTRIL) 20 MG tablet, Take 1 tablet (20 mg total) by mouth daily. (NEEDS TO BE SEEN BEFORE NEXT REFILL), Disp: 30 tablet, Rfl: 0   ondansetron (ZOFRAN ODT) 4 MG disintegrating tablet, Take 1 tablet (4 mg total) by mouth every 8 (eight) hours as needed for nausea or vomiting., Disp: 20 tablet, Rfl: 6   pantoprazole (PROTONIX) 40 MG tablet, Take 1 tablet (40 mg total) by mouth daily., Disp: 90 tablet, Rfl: 1   zonisamide (ZONEGRAN) 100 MG capsule, Take 2 capsules (200 mg total) by mouth at bedtime., Disp: 60 capsule, Rfl: 11  Current Facility-Administered Medications:    incobotulinumtoxinA (XEOMIN) 100 units injection 300 Units, 300 Units, Intramuscular, Q90 days, Levert Feinstein, MD, 300 Units at 09/08/21 1634   incobotulinumtoxinA (XEOMIN) 100 units injection 300 Units, 300 Units, Intramuscular, Q90 days, Levert Feinstein, MD, 300 Units at 12/05/21 1005  Observations/Objective: Patient is well-developed, well-nourished in no acute distress.  Resting comfortably  at home.  Head is normocephalic, atraumatic.  No labored breathing.  Speech is clear and coherent with logical content.  Patient is alert and oriented at baseline.    Assessment and Plan:  Cheryl Reeves in today with chief complaint of No chief complaint on file.   1. Cerebrovascular accident (CVA), unspecified mechanism (HCC) Fall prevention - ibuprofen (ADVIL) 800 MG tablet; Take 1 tablet (800 mg total) by mouth every 8 (eight) hours as needed for moderate pain.  Dispense: 21 tablet; Refill: 0  2. Chronic migraine without aura without status migrainosus, not intractable Avoid caffeine - butalbital-acetaminophen-caffeine (FIORICET) 50-325-40 MG tablet; Take 1 tablet by mouth every 6 (six) hours as needed for headache.  Dispense: 12 tablet; Refill: 5  3. Gastroesophageal reflux disease, unspecified whether esophagitis present Avoid spicy  foods Do not eat 2 hours prior to bedtime - pantoprazole (PROTONIX) 40 MG tablet; Take 1 tablet (40 mg total) by mouth daily.  Dispense: 90 tablet; Refill: 1  4. Seizures (HCC) Report any seizure activity Need  to follow up with neurology  5. Foot drop, right  6. Anxiety Stress management - hydrOXYzine (VISTARIL) 25 MG capsule; TAKE 1  CAPSULE BY MOUTH TWICE DAILY AS NEEDED  Dispense: 180 capsule; Refill: 1  7. GAD (generalized anxiety disorder) - buPROPion (WELLBUTRIN XL) 300 MG 24 hr tablet; Take 1 tablet (300 mg total) by mouth daily. (NEEDS TO BE SEEN BEFORE NEXT REFILL)  Dispense: 30 tablet; Refill: 0  8. Late effect of cerebrovascular accident (CVA) - baclofen (LIORESAL) 10 MG tablet; TAKE ONE (1) TABLET BY MOUTH TWO (2) TIMES DAILY  Dispense: 60 each; Refill: 2  9. Pure hypercholesterolemia - atorvastatin (LIPITOR) 20 MG tablet; Take 1 tablet (20 mg total) by mouth daily. (NEEDS TO BE SEEN BEFORE NEXT REFILL)  Dispense: 30 tablet; Refill: 0  10. Primary hypertension - lisinopril (ZESTRIL) 20 MG tablet; Take 1 tablet (20 mg total) by mouth daily. (NEEDS TO BE SEEN BEFORE NEXT REFILL)  Dispense: 30 tablet; Refill: 0    Follow Up Instructions: I discussed the assessment and treatment plan with the patient. The patient was provided an opportunity to ask questions and all were answered. The patient agreed with the plan and demonstrated an understanding of the instructions.  A copy of instructions were sent to the patient via MyChart.  The patient was advised to call back or seek an in-person evaluation if the symptoms worsen or if the condition fails to improve as anticipated.  Time:  I spent 13 minutes with the patient via telehealth technology discussing the above problems/concerns.    [name], FNP

## 2022-02-24 NOTE — Addendum Note (Signed)
Addended by: [name] on: 02/24/2022 11:02 AM   Modules accepted: Orders

## 2022-02-24 NOTE — Patient Instructions (Signed)

## 2022-02-25 NOTE — Telephone Encounter (Signed)
Rx refilled.

## 2022-03-03 ENCOUNTER — Ambulatory Visit: Payer: PPO | Admitting: Neurology

## 2022-03-16 ENCOUNTER — Encounter: Payer: Self-pay | Admitting: Nurse Practitioner

## 2022-03-16 ENCOUNTER — Telehealth (INDEPENDENT_AMBULATORY_CARE_PROVIDER_SITE_OTHER): Payer: PPO | Admitting: Nurse Practitioner

## 2022-03-16 DIAGNOSIS — J0111 Acute recurrent frontal sinusitis: Secondary | ICD-10-CM

## 2022-03-16 MED ORDER — PREDNISONE 10 MG (21) PO TBPK: ORAL_TABLET | ORAL | 0 refills | Status: DC

## 2022-03-16 MED ORDER — AZITHROMYCIN 250 MG PO TABS: ORAL_TABLET | ORAL | 0 refills | Status: AC

## 2022-03-16 NOTE — Progress Notes (Signed)
   Virtual Visit  Note Due to COVID-19 pandemic this visit was conducted virtually. This visit type was conducted due to national recommendations for restrictions regarding the COVID-19 Pandemic (e.g. social distancing, sheltering in place) in an effort to limit this patient's exposure and mitigate transmission in our community. All issues noted in this document were discussed and addressed.  A physical exam was not performed with this format.  I connected with Cheryl Reeves on 03/16/22 at 12;56 pm  by telephone and verified that I am speaking with the correct person using two identifiers. Cheryl Reeves is currently located at home during visit. The provider, Ivy [place], NP is located in their office at time of visit.  I discussed the limitations, risks, security and privacy concerns of performing an evaluation and management service by telephone and the availability of in person appointments. I also discussed with the patient that there may be a patient responsible charge related to this service. The patient expressed understanding and agreed to proceed.   History and Present Illness:  Sinusitis This is a new problem. The current episode started in the past 7 days. The problem is unchanged. There has been no fever. The pain is mild. Associated symptoms include congestion, a hoarse voice, sinus pressure and a sore throat. Pertinent negatives include no coughing. Past treatments include nothing.      Review of Systems  Constitutional: Negative.   HENT:  Positive for congestion, hoarse voice, sinus pressure and sore throat.   Eyes: Negative.   Respiratory: Negative.  Negative for cough.   Cardiovascular: Negative.   Genitourinary: Negative.   Musculoskeletal: Negative.   Skin:  Negative for itching and rash.  All other systems reviewed and are negative.    Observations/Objective: Tele-visit patient not in distress  Assessment and Plan:  Take meds as prescribed - Use a  cool mist humidifier  -Use saline nose sprays frequently -Force fluids -For fever or aches or pains- take Tylenol or ibuprofen. -If symptoms do not improve, she may need to be COVID tested to rule this out   Follow Up Instructions: Follow up with unresolved symptoms    I discussed the assessment and treatment plan with the patient. The patient was provided an opportunity to ask questions and all were answered. The patient agreed with the plan and demonstrated an understanding of the instructions.   The patient was advised to call back or seek an in-person evaluation if the symptoms worsen or if the condition fails to improve as anticipated.  The above assessment and management plan was discussed with the patient. The patient verbalized understanding of and has agreed to the management plan. Patient is aware to call the clinic if symptoms persist or worsen. Patient is aware when to return to the clinic for a follow-up visit. Patient educated on when it is appropriate to go to the emergency department.   Time call ended:  1:08 pm   I provided 12 minutes of  non face-to-face time during this encounter.    Ivy Lynn, NP

## 2022-03-30 ENCOUNTER — Ambulatory Visit (INDEPENDENT_AMBULATORY_CARE_PROVIDER_SITE_OTHER): Payer: PPO | Admitting: Nurse Practitioner

## 2022-03-30 ENCOUNTER — Encounter: Payer: Self-pay | Admitting: Nurse Practitioner

## 2022-03-30 VITALS — BP 104/62 | HR 76 | Temp 96.9°F | Resp 20 | Ht 69.0 in | Wt 146.0 lb

## 2022-03-30 DIAGNOSIS — S139XXA Sprain of joints and ligaments of unspecified parts of neck, initial encounter: Secondary | ICD-10-CM | POA: Diagnosis not present

## 2022-03-30 MED ORDER — CYCLOBENZAPRINE HCL 5 MG PO TABS: 5.0000 mg | ORAL_TABLET | Freq: Three times a day (TID) | ORAL | 1 refills | Status: DC | PRN

## 2022-03-30 MED ORDER — PREDNISONE 10 MG (21) PO TBPK: ORAL_TABLET | ORAL | 0 refills | Status: DC

## 2022-03-30 NOTE — Patient Instructions (Signed)
Cervical Sprain A cervical sprain is also called a neck sprain. It is a stretch or tear in one or more ligaments in the neck. Ligaments are tissues that connect bones to each other. Neck sprains can be mild, bad, or very bad. A very bad sprain in the neck can cause the bones in the neck to be unstable. This can damage the spinal cord. It can also cause serious problems in the brain, spinal cord, and nerves (nervous system). Most neck sprains heal in 4-6 weeks. It can take more or less time depending on: What caused the injury. The amount of injury. What are the causes? Neck sprains may be caused by trauma, such as: An injury from an accident in a vehicle such as a car or boat. A fall. The head and neck being moved front to back or side to side all of a sudden (whiplash injury). Mild neck sprains may be caused by wear and tear over time. What increases the risk? The following factors may make you more likely to develop this condition: Taking part in activities that put you at high risk of hurting your neck. These include: Contact sports. Car racing. Gymnastics. Diving. Taking risks when driving or riding in a vehicle such as a car or boat. Arthritis caused by wear and tear of the joints in the spine. The neck not being very strong or flexible. Having had a neck injury in the past. Poor posture. Spending a lot of time in certain positions that put stress on the neck. This may be from sitting at a computer for a long time. What are the signs or symptoms? Symptoms of this condition include: Your neck, shoulders, or upper back feeling: Painful or sore. Stiff. Tender. Swollen. Hot, or like it is burning. Sudden tightening of neck muscles (spasms). Not being able to move the neck very much. Headache. Feeling dizzy. Feeling like you may vomit, or vomiting. Having a hand or arm that: Feels weak. Loses feeling (feels numb). Tingles. You may get symptoms right away after injury, or you  may get them over a few days. In some cases, symptoms may go away with treatment and come back over time. How is this treated? This condition is treated by: Resting your neck. Icing the part of your neck that is hurt. Doing exercises to restore movement and strength to your neck (physical therapy). If there is no swelling, you may use heat therapy 2-3 days after the injury took place. If your injury is very bad, treatment may also include: Keeping your neck in place for a length of time. This may be done using: A neck collar. This supports your chin and the back of your head. A cervical traction device. This is a sling that holds up your head. The sling removes weight and pressure from your neck. It may also help to relieve pain. Medicines that help with: Pain. Irritation and swelling (inflammation). Medicines that help to relax your muscles (muscle relaxants). Surgery. This is rare. Follow these instructions at home: Medicines  Take over-the-counter and prescription medicines only as told by your doctor. Ask your doctor if the medicine prescribed to you: Requires you to avoid driving or using heavy machinery. Can cause trouble pooping (constipation). You may need to take these actions to prevent or treat trouble pooping: Drink enough fluid to keep your pee (urine) pale yellow. Take over-the-counter or prescription medicines. Eat foods that are high in fiber. These include beans, whole grains, and fresh fruits and vegetables. Limit   foods that are high in fat and sugar. These include fried or sweet foods. If you have a neck collar: Wear it as told by your doctor. Do not take it off unless told. Ask your doctor before adjusting your collar. If you have long hair, keep it outside of the collar. Ask your doctor if you may take off the collar for cleaning and bathing. If you may take off the collar: Follow instructions about how to take it off safely. Clean it by hand with mild soap and  water. Let it air-dry fully. If your collar has pads that you can take out: Take the pads out every 1-2 days. Wash them by hand with soap and water. Let the pads air-dry fully before you put them back in the collar. Tell your doctor if your skin under the collar has irritation or sores. Managing pain, stiffness, and swelling     Use a cervical traction device, if told by your doctor. If told, put ice on the affected area. To do this: Put ice in a plastic bag. Place a towel between your skin and the bag. Leave the ice on for 20 minutes, 2-3 times a day. If told, put heat on the affected area. Do this before exercise or as often as told by your doctor. Use the heat source that your doctor recommends, such as a moist heat pack or a heating pad. Place a towel between your skin and the heat source. Leave the heat on for 20-30 minutes. Take the heat off if your skin turns bright red. This is very important if you cannot feel pain, heat, or cold. You may have a greater risk of getting burned. Activity Do not drive while wearing a neck collar. If you do not have a neck collar, ask if it is safe to drive while your neck heals. Do not lift anything that is heavier than 10 lb (4.5 kg), or the limit that you are told, until your doctor tells you that it is safe. Rest as told by your doctor. Do exercises as told by your doctor or physical therapist. Return to your normal activities as told by your doctor. Avoid positions and activities that make you feel worse. Ask your doctor what activities are safe for you. General instructions Do not use any products that contain nicotine or tobacco, such as cigarettes, e-cigarettes, and chewing tobacco. These can delay healing. If you need help quitting, ask your doctor. Keep all follow-up visits as told by your doctor or physical therapist. This is important. How is this prevented? To prevent a neck sprain from happening again: Practice good posture. Adjust  your workstation to help you do this. Exercise regularly as told by your doctor or physical therapist. Avoid activities that are risky or may cause a neck sprain. Contact a doctor if: Your symptoms get worse. Your symptoms do not get better after 2 weeks of treatment. Your pain gets worse. Medicine does not help your pain. You have new symptoms that you cannot explain. Your neck collar gives you sores on your skin or bothers your skin. Get help right away if: You have very bad pain. You get any of the following in any part of your body: Loss of feeling. Tingling. Weakness. You cannot move a part of your body. You have neck pain and either of these: Very bad dizziness. A very bad headache. Summary A cervical sprain is also called a neck sprain. It is a stretch or tear in one or more   ligaments in the neck. Ligaments are tissues that connect bones. Neck sprains may be caused by trauma, such as an injury or a fall. You may get symptoms right away after injury, or you may get them over a few days. Neck sprains may be treated with rest, heat, ice, medicines, exercise, and surgery. This information is not intended to replace advice given to you by your health care provider. Make sure you discuss any questions you have with your health care provider. Document Revised: 09/01/2021 Document Reviewed: 02/01/2019 Elsevier Patient Education  2023 [employer] Inc[employer]  

## 2022-03-30 NOTE — Progress Notes (Signed)
Subjective:    Patient ID: Cheryl Reeves, female    DOB: 02/01/1971, 51 y.o.   MRN: [id number]   Chief Complaint: Neck Pain [place] last Thursday and hurt bottom and neck)   Pt seen today for neck pain; pt slipped on bottom step last Thursday.  Neck Pain  This is a new problem. The current episode started in the past 7 days (last Thursday). The problem occurs constantly. The problem has been gradually worsening. The pain is associated with a fall. The pain is present in the left side, right side and occipital region. The quality of the pain is described as stabbing (throbbing, pressure). The pain is at a severity of 8/10. The symptoms are aggravated by twisting and position. The pain is Same all the time. Pertinent negatives include no chest pain, headaches, numbness, syncope or tingling. She has tried NSAIDs, ice and heat for the symptoms. The treatment provided mild relief.       Review of Systems  Constitutional:  Negative for fatigue.  Respiratory:  Negative for chest tightness and shortness of breath.   Cardiovascular:  Negative for chest pain and syncope.  Musculoskeletal:  Positive for neck pain.  Neurological:  Negative for dizziness, tingling, numbness and headaches.  All other systems reviewed and are negative.      Objective:   Physical Exam Vitals and nursing note reviewed.  Constitutional:      General: She is not in acute distress.    Appearance: Normal appearance. She is not ill-appearing.  HENT:     Head: Normocephalic and atraumatic.  Cardiovascular:     Rate and Rhythm: Normal rate.  Pulmonary:     Effort: Pulmonary effort is normal. No respiratory distress.  Musculoskeletal:     Cervical back: No edema or crepitus. Pain with movement and muscular tenderness present. No spinous process tenderness. Decreased range of motion.  Neurological:     General: No focal deficit present.     Mental Status: She is alert and oriented to person, place, and time.   Psychiatric:        Mood and Affect: Mood and affect normal.        Behavior: Behavior normal.   BP 104/62   Pulse 76   Temp (!) 96.9 F (36.1 C) (Temporal)   Resp 20   Ht 5\' 9"  (1.753 m)   Wt 146 lb (66.2 kg)   SpO2 98%   BMI 21.56 kg/m          Assessment & Plan:   Cheryl Reeves in today with chief complaint of Neck Pain [place] last Thursday and hurt bottom and neck)   1. Neck sprain, initial encounter Rest Moist heat Hot showers with neck stretches RTO for new or worsening symptoms or if symptoms fail to improve. - predniSONE (STERAPRED UNI-PAK 21 TAB) 10 MG (21) TBPK tablet; As directed x 6 days  Dispense: 21 tablet; Refill: 0 - cyclobenzaprine (FLEXERIL) 5 MG tablet; Take 1 tablet (5 mg total) by mouth 3 (three) times daily as needed for muscle spasms.  Dispense: 30 tablet; Refill: 1    The above assessment and management plan was discussed with the patient. The patient verbalized understanding of and has agreed to the management plan. Patient is aware to call the clinic if symptoms persist or worsen. Patient is aware when to return to the clinic for a follow-up visit. Patient educated on when it is appropriate to go to the emergency department.   [employer],  FNP student  Bennie Pierini, FNP

## 2022-04-22 IMAGING — CT CT RENAL STONE PROTOCOL
2 of 4 series · 15 of 46 positions shown, 17 images · non-contrast
Comparison: None.

CLINICAL DATA: Flank pain, kidney stone suspected.

EXAM:
CT ABDOMEN AND PELVIS WITHOUT CONTRAST
TECHNIQUE: Multidetector CT imaging of the abdomen and pelvis was performed
following the standard protocol without IV contrast.

[Series 2: axial st · axial · 0.79mm/px · z∈[+812,+1262]mm · 12 of 108 slices shown, 14 images]
[im 9/108  soft-tissue]
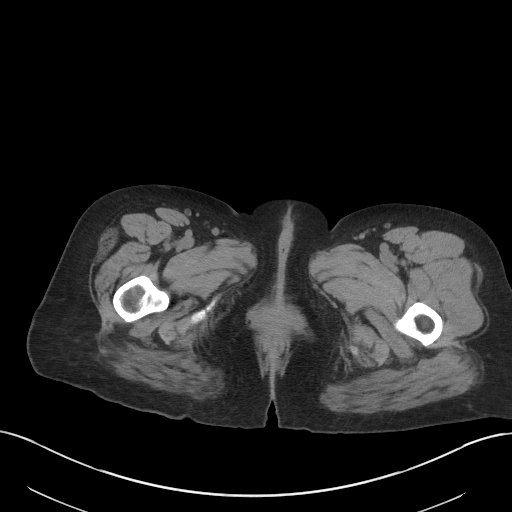
[im 9/108  bone]
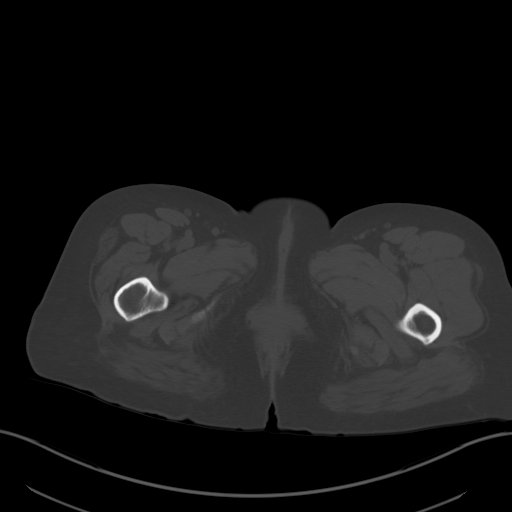
[im 17/108  soft-tissue]
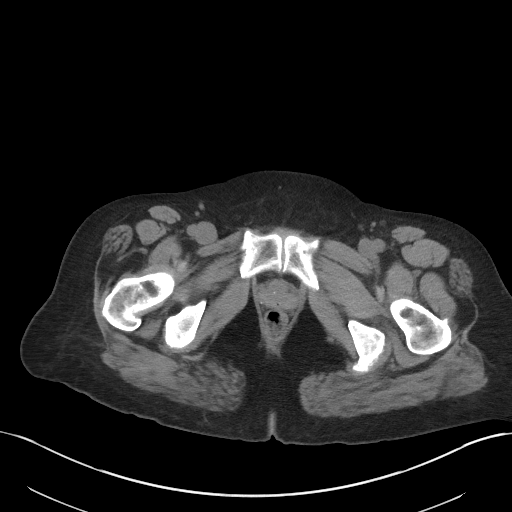
[im 25/108  soft-tissue]
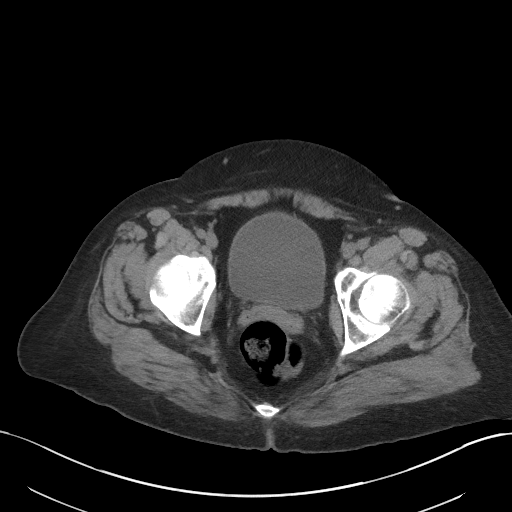
[im 33/108  soft-tissue]
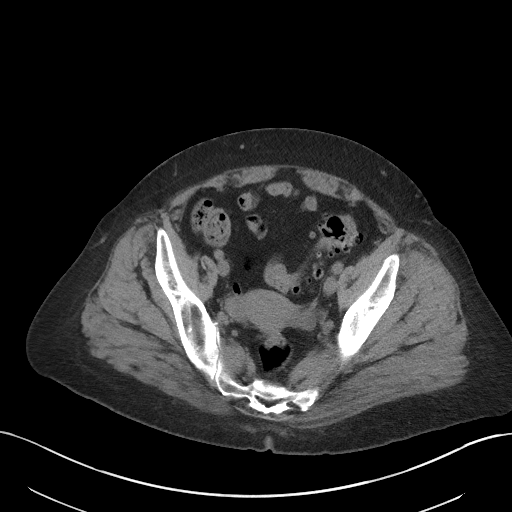
[im 42/108  soft-tissue]
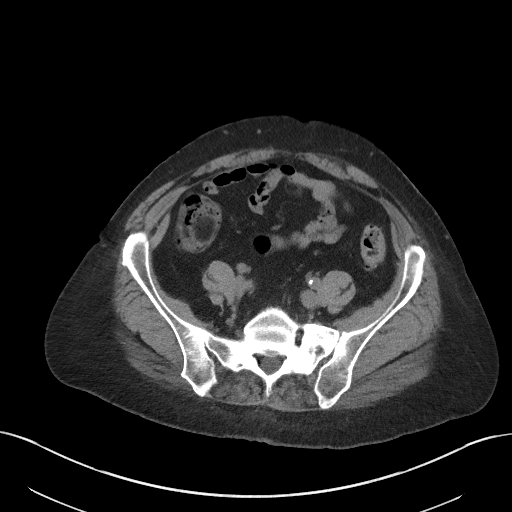
[im 50/108  soft-tissue]
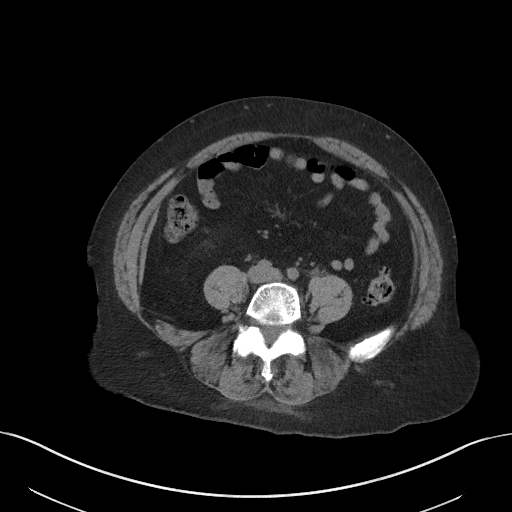
[im 58/108  soft-tissue]
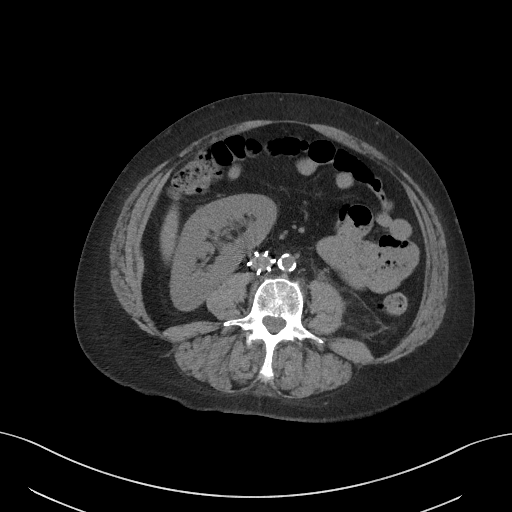
[im 66/108  soft-tissue]
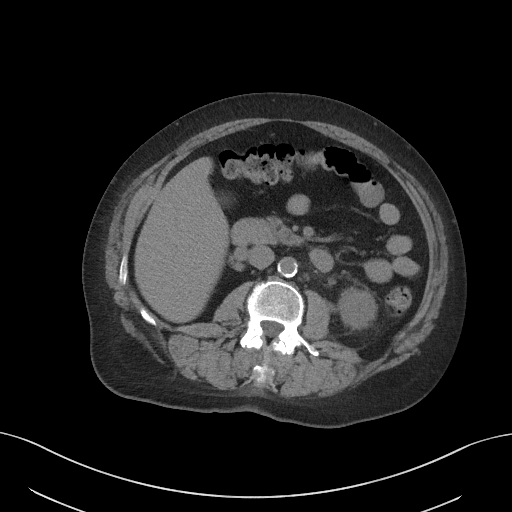
[im 75/108  soft-tissue]
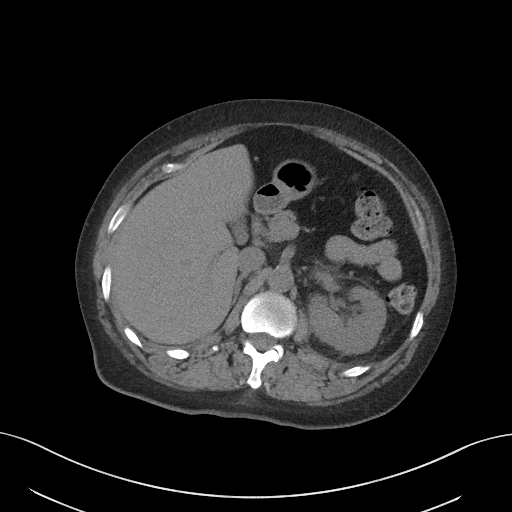
[im 75/108  bone]
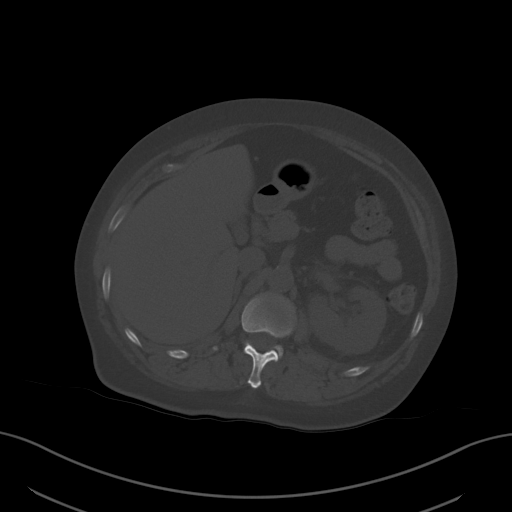
[im 83/108  soft-tissue]
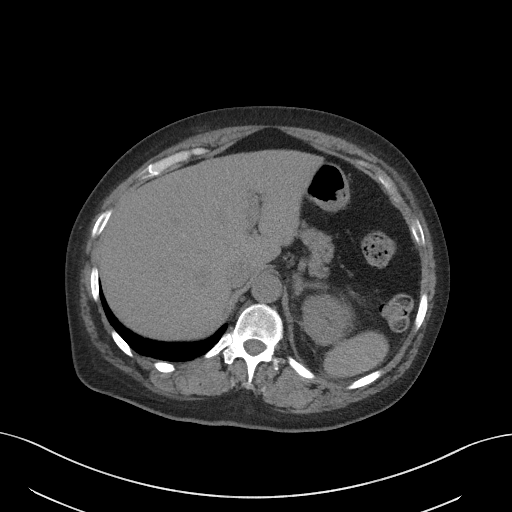
[im 91/108  soft-tissue]
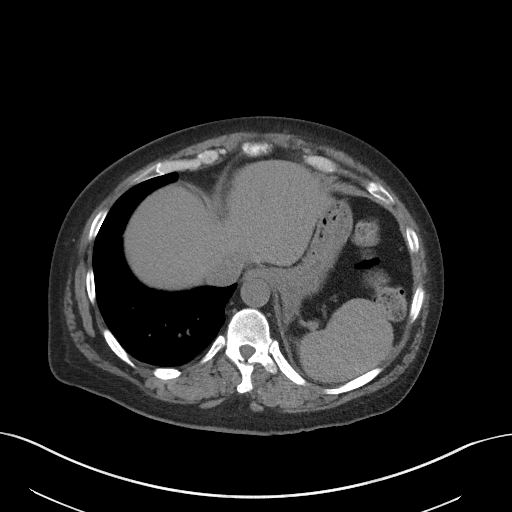
[im 99/108  soft-tissue]
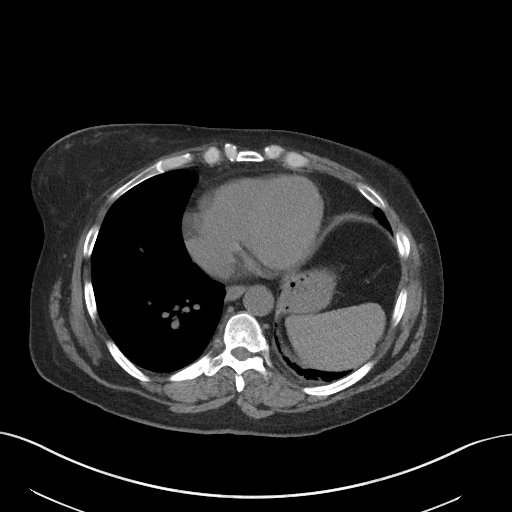

[Series 5: coronal st · coronal · 0.81mm/px · 3 of 99 slices shown]
[im 33/99  soft-tissue]
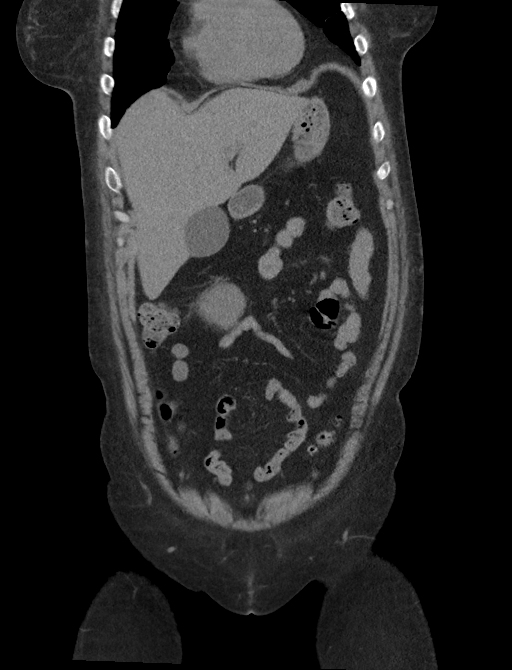
[im 44/99  soft-tissue]
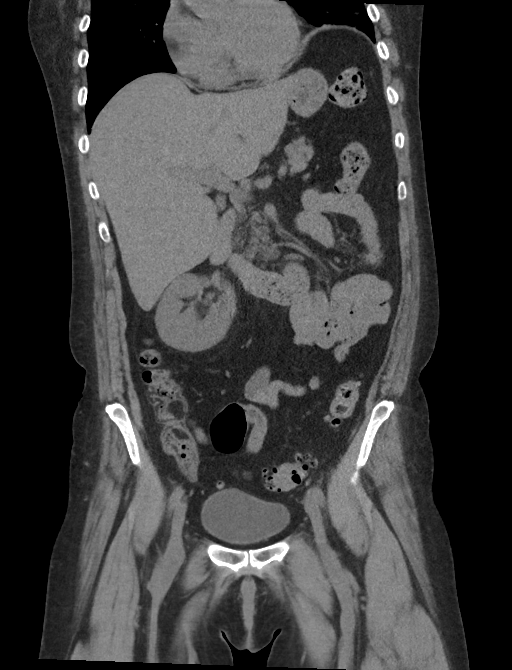
[im 55/99  soft-tissue]
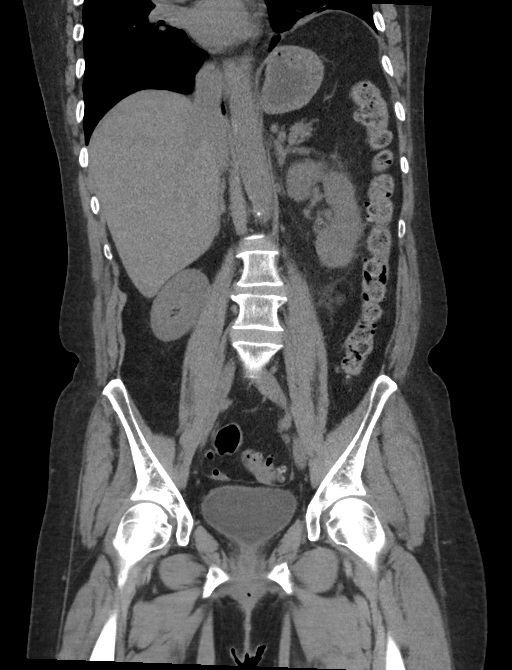

[15 of 46 positions shown; findings below may reference images not displayed]

FINDINGS: Lower chest: Subsegmental atelectasis in the lingula and lower
lobes, left greater than right.

Hepatobiliary: No focal liver abnormality is seen. No gallstones,
gallbladder wall thickening, or biliary dilatation.

Pancreas: Diffuse mild parenchymal atrophy. No pancreatic ductal
dilatation or surrounding inflammatory changes.

Spleen: Normal in size without focal abnormality.

Adrenals/Urinary Tract: Mild dilatation of the upper and middle
thirds of the left ureter with mild periureteral fat stranding. Left
perinephric fat stranding is also noted. No perinephric fluid
collection. No calcified stones seen within the left renal
collecting system or urinary bladder.

Congenital malrotation of the right kidney. No right hydronephrosis
or renal stone.

Stomach/Bowel: Stomach is within normal limits. Appendix appears
normal. No evidence of bowel wall thickening, distention, or
inflammatory changes. Diverticulosis of the descending and sigmoid
colon without findings of diverticulitis.

Vascular/Lymphatic: Mild-moderate calcific atherosclerosis of the
distal abdominal aorta and proximal common iliac arteries. The
abdominal aorta is normal in caliber. IVC filter is in appropriate
position. No enlarged abdominal or pelvic lymph nodes.

Reproductive: Uterus and bilateral adnexa are unremarkable.

Other: No abdominal wall hernia or abnormality. No abdominopelvic
ascites.

Musculoskeletal: No acute or significant osseous findings.
IMPRESSION: 1. Mild dilatation the upper and middle thirds of the left ureter
with mild perinephric and periureteral fat stranding, suggestive of
recent stone passage. No calcified stone is seen within the left
renal collecting system or urinary bladder. Of note, evaluation for
pyelonephritis is limited without intravenous contrast.
2. Diverticulosis of the descending and sigmoid colon without
findings of diverticulitis.
3. Mild-moderate calcific atherosclerosis of the distal abdominal
aorta and proximal common iliac arteries.

Aortic Atherosclerosis ([id number]).

## 2022-05-08 ENCOUNTER — Other Ambulatory Visit: Payer: Self-pay | Admitting: Nurse Practitioner

## 2022-05-08 DIAGNOSIS — E7800 Pure hypercholesterolemia, unspecified: Secondary | ICD-10-CM

## 2022-05-08 DIAGNOSIS — I639 Cerebral infarction, unspecified: Secondary | ICD-10-CM

## 2022-05-08 DIAGNOSIS — I10 Essential (primary) hypertension: Secondary | ICD-10-CM

## 2022-06-22 ENCOUNTER — Other Ambulatory Visit: Payer: Self-pay

## 2022-06-22 ENCOUNTER — Other Ambulatory Visit: Payer: PPO

## 2022-06-22 DIAGNOSIS — R300 Dysuria: Secondary | ICD-10-CM

## 2022-06-22 LAB — URINALYSIS, COMPLETE
Bilirubin, UA: NEGATIVE
Glucose, UA: NEGATIVE
Ketones, UA: NEGATIVE
Leukocytes,UA: NEGATIVE
Nitrite, UA: NEGATIVE
Protein,UA: NEGATIVE
Specific Gravity, UA: <1.005 — ABNORMAL LOW (ref 1.005–1.030)
Urobilinogen, Ur: 0.2 mg/dL (ref 0.2–1.0)
pH, UA: 6 (ref 5.0–7.5)

## 2022-06-22 LAB — MICROSCOPIC EXAMINATION
Bacteria, UA: NONE SEEN
Epithelial Cells (non renal): NONE SEEN /hpf (ref 0–10)
RBC, Urine: NONE SEEN /hpf (ref 0–2)
Renal Epithel, UA: NONE SEEN /hpf
WBC, UA: NONE SEEN /hpf (ref 0–5)

## 2022-06-23 ENCOUNTER — Encounter: Payer: Self-pay | Admitting: Nurse Practitioner

## 2022-06-23 ENCOUNTER — Telehealth (INDEPENDENT_AMBULATORY_CARE_PROVIDER_SITE_OTHER): Payer: PPO | Admitting: Nurse Practitioner

## 2022-06-23 DIAGNOSIS — J0101 Acute recurrent maxillary sinusitis: Secondary | ICD-10-CM | POA: Diagnosis not present

## 2022-06-23 MED ORDER — AMOXICILLIN-POT CLAVULANATE 875-125 MG PO TABS: 1.0000 | ORAL_TABLET | Freq: Two times a day (BID) | ORAL | 0 refills | Status: DC

## 2022-06-23 MED ORDER — PREDNISONE 20 MG PO TABS: 40.0000 mg | ORAL_TABLET | Freq: Every day | ORAL | 0 refills | Status: AC

## 2022-06-23 NOTE — Progress Notes (Signed)
Virtual Visit Consent   Cheryl Reeves, you are scheduled for a virtual visit with Cheryl Daphine Deutscher, FNP, a [facility] provider, today.     Just as with appointments in the office, your consent must be obtained to participate.  Your consent will be active for this visit and any virtual visit you may have with one of our providers in the next 365 days.     If you have a MyChart account, a copy of this consent can be sent to you electronically.  All virtual visits are billed to your insurance company just like a traditional visit in the office.    As this is a virtual visit, video technology does not allow for your provider to perform a traditional examination.  This may limit your provider's ability to fully assess your condition.  If your provider identifies any concerns that need to be evaluated in person or the need to arrange testing (such as labs, EKG, etc.), we will make arrangements to do so.     Although advances in technology are sophisticated, we cannot ensure that it will always work on either your end or our end.  If the connection with a video visit is poor, the visit may have to be switched to a telephone visit.  With either a video or telephone visit, we are not always able to ensure that we have a secure connection.     I need to obtain your verbal consent now.   Are you willing to proceed with your visit today? YES   Loukisha Teresa Reeves has provided verbal consent on 06/23/2022 for a virtual visit (video or telephone).   Cheryl Daphine Deutscher, FNP   Date: 06/23/2022 8:31 AM   Virtual Visit via Video Note   I, Cheryl Reeves, connected with Cheryl Reeves ([id number], December 29, 1970) on 06/23/22 at  8:45 AM EST by a video-enabled telemedicine application and verified that I am speaking with the correct person using two identifiers.  Location: Patient: Virtual Visit Location Patient: Home Provider: Virtual Visit Location Provider: Mobile   I discussed the  limitations of evaluation and management by telemedicine and the availability of in person appointments. The patient expressed understanding and agreed to proceed.    History of Present Illness: Cheryl Reeves is a 52 y.o. who identifies as a female who was assigned female at birth, and is being seen today for congestion.  HPI: Sinusitis This is a new problem. The current episode started 1 to 4 weeks ago. The problem has been waxing and waning since onset. There has been no fever. Her pain is at a severity of 7/10. The pain is mild. Associated symptoms include congestion, headaches, sinus pressure and a sore throat. Pertinent negatives include no coughing. Past treatments include acetaminophen (has tried lots of OTC meds). The treatment provided mild relief.    Review of Systems  HENT:  Positive for congestion, sinus pressure and sore throat.   Respiratory:  Negative for cough.   Neurological:  Positive for headaches.    Problems:  Patient Active Problem List   Diagnosis Date Noted   Spastic hemiparesis of right dominant side (HCC) 09/08/2021   Cerebrovascular accident (CVA) (HCC) 09/08/2021   Chronic migraine w/o aura w/o status migrainosus, not intractable 01/27/2021   Right spastic hemiplegia (HCC) 01/27/2021   Acute pyelonephritis 01/17/2021   Migraine 09/19/2019   History of multiple allergies 01/31/2018   Compression fracture of thoracic vertebra (HCC) 09/27/2017   Pain in thoracic spine 09/27/2017  Carpal tunnel syndrome of left wrist 10/16/2016   Allergic rhinitis 10/09/2013   GERD (gastroesophageal reflux disease) 10/09/2013   Foot drop, right 10/09/2013   Rosacea, acne 10/09/2013   Seizures ([place]) 10/09/2013    Allergies:  Allergies  Allergen Reactions   Codeine     headaches   Medications:  Current Outpatient Medications:    aspirin 81 MG tablet, Take 81 mg by mouth daily., Disp: , Rfl:    atorvastatin (LIPITOR) 20 MG tablet, TAKE ONE (1) TABLET BY MOUTH EVERY  DAY, Disp: 30 tablet, Rfl: 3   baclofen (LIORESAL) 10 MG tablet, TAKE ONE (1) TABLET BY MOUTH TWO (2) TIMES DAILY, Disp: 60 each, Rfl: 2   buPROPion (WELLBUTRIN XL) 300 MG 24 hr tablet, Take 1 tablet (300 mg total) by mouth daily. (NEEDS TO BE SEEN BEFORE NEXT REFILL), Disp: 30 tablet, Rfl: 0   butalbital-acetaminophen-caffeine (FIORICET) 50-325-40 MG tablet, Take 1 tablet by mouth every 6 (six) hours as needed for headache., Disp: 12 tablet, Rfl: 5   cyclobenzaprine (FLEXERIL) 5 MG tablet, Take 1 tablet (5 mg total) by mouth 3 (three) times daily as needed for muscle spasms., Disp: 30 tablet, Rfl: 1   fluticasone (FLONASE) 50 MCG/ACT nasal spray, Place 2 sprays into both nostrils daily., Disp: 16 g, Rfl: 6   hydrOXYzine (VISTARIL) 25 MG capsule, TAKE 1 CAPSULE BY MOUTH TWICE DAILY AS NEEDED, Disp: 180 capsule, Rfl: 1   ibuprofen (ADVIL) 800 MG tablet, TAKE 1 TABLET BY MOUTH EVERY 8 HOURS AS NEEDED FOR PAIN, Disp: 21 tablet, Rfl: 3   lisinopril (ZESTRIL) 20 MG tablet, TAKE ONE (1) TABLET BY MOUTH EVERY DAY, Disp: 30 tablet, Rfl: 3   ondansetron (ZOFRAN ODT) 4 MG disintegrating tablet, Take 1 tablet (4 mg total) by mouth every 8 (eight) hours as needed for nausea or vomiting., Disp: 20 tablet, Rfl: 6   pantoprazole (PROTONIX) 40 MG tablet, Take 1 tablet (40 mg total) by mouth daily., Disp: 90 tablet, Rfl: 1   predniSONE (STERAPRED UNI-PAK 21 TAB) 10 MG (21) TBPK tablet, As directed x 6 days, Disp: 21 tablet, Rfl: 0   zonisamide (ZONEGRAN) 100 MG capsule, TAKE 2 CAPSULES BY MOUTH AT BEDTIME, Disp: 60 capsule, Rfl: 11  Current Facility-Administered Medications:    incobotulinumtoxinA (XEOMIN) 100 units injection 300 Units, 300 Units, Intramuscular, Q90 days, Marcial Pacas, MD, 300 Units at 09/08/21 1634   incobotulinumtoxinA (XEOMIN) 100 units injection 300 Units, 300 Units, Intramuscular, Q90 days, Marcial Pacas, MD, 300 Units at 12/05/21 1005  Observations/Objective: Patient is well-developed,  well-nourished in no acute distress.  Resting comfortably  at home.  Head is normocephalic, atraumatic.  No labored breathing.  Speech is clear and coherent with logical content.  Patient is alert and oriented at baseline.  Raspy voice No cough during visit  Assessment and Plan:  Cheryl Reeves in today with chief complaint of No chief complaint on file.   1. Acute recurrent maxillary sinusitis 1. Take meds as prescribed 2. Use a cool mist humidifier especially during the winter months and when heat has been humid. 3. Use saline nose sprays frequently 4. Saline irrigations of the nose can be very helpful if done frequently.  * 4X daily for 1 week*  * Use of a nettie pot can be helpful with this. Follow directions with this* 5. Drink plenty of fluids 6. Keep thermostat turn down low 7.For any cough or congestion- mucinex oTC  8. For fever or aces or pains- take tylenol  or ibuprofen appropriate for age and weight.  * for fevers greater than 101 orally you may alternate ibuprofen and tylenol every  3 hours.    Meds ordered this encounter  Medications   amoxicillin-clavulanate (AUGMENTIN) 875-125 MG tablet    Sig: Take 1 tablet by mouth 2 (two) times daily.    Dispense:  14 tablet    Refill:  0    Order Specific Question:   Supervising Provider    Answer:   Caryl Pina A [[id number]]   predniSONE (DELTASONE) 20 MG tablet    Sig: Take 2 tablets (40 mg total) by mouth daily with breakfast for 5 days. 2 po daily for 5 days    Dispense:  10 tablet    Refill:  0    Order Specific Question:   Supervising Provider    Answer:   Caryl Pina A [[id number]]       Follow Up Instructions: I discussed the assessment and treatment plan with the patient. The patient was provided an opportunity to ask questions and all were answered. The patient agreed with the plan and demonstrated an understanding of the instructions.  A copy of instructions were sent to the patient via  MyChart.  The patient was advised to call back or seek an in-person evaluation if the symptoms worsen or if the condition fails to improve as anticipated.  Time:  I spent 6 minutes with the patient via telehealth technology discussing the above problems/concerns.    Cheryl Hassell Done, FNP

## 2022-06-23 NOTE — Patient Instructions (Signed)
1. Take meds as prescribed 2. Use a cool mist humidifier especially during the winter months and when heat has been humid. 3. Use saline nose sprays frequently 4. Saline irrigations of the nose can be very helpful if done frequently.  * 4X daily for 1 week*  * Use of a nettie pot can be helpful with this. Follow directions with this* 5. Drink plenty of fluids 6. Keep thermostat turn down low 7.For any cough or congestion- mucinex OTC 8. For fever or aces or pains- take tylenol or ibuprofen appropriate for age and weight.  * for fevers greater than 101 orally you may alternate ibuprofen and tylenol every  3 hours.    

## 2022-06-24 LAB — URINE CULTURE

## 2022-07-23 ENCOUNTER — Ambulatory Visit (INDEPENDENT_AMBULATORY_CARE_PROVIDER_SITE_OTHER): Payer: PPO | Admitting: Nurse Practitioner

## 2022-07-23 ENCOUNTER — Encounter: Payer: Self-pay | Admitting: Nurse Practitioner

## 2022-07-23 VITALS — BP 126/84 | HR 63 | Temp 97.5°F | Resp 20 | Ht 69.0 in | Wt 148.0 lb

## 2022-07-23 DIAGNOSIS — G43709 Chronic migraine without aura, not intractable, without status migrainosus: Secondary | ICD-10-CM

## 2022-07-23 DIAGNOSIS — M21371 Foot drop, right foot: Secondary | ICD-10-CM | POA: Diagnosis not present

## 2022-07-23 DIAGNOSIS — B370 Candidal stomatitis: Secondary | ICD-10-CM

## 2022-07-23 DIAGNOSIS — I10 Essential (primary) hypertension: Secondary | ICD-10-CM | POA: Diagnosis not present

## 2022-07-23 DIAGNOSIS — F419 Anxiety disorder, unspecified: Secondary | ICD-10-CM | POA: Diagnosis not present

## 2022-07-23 DIAGNOSIS — G8111 Spastic hemiplegia affecting right dominant side: Secondary | ICD-10-CM | POA: Diagnosis not present

## 2022-07-23 DIAGNOSIS — I6930 Unspecified sequelae of cerebral infarction: Secondary | ICD-10-CM | POA: Diagnosis not present

## 2022-07-23 DIAGNOSIS — R569 Unspecified convulsions: Secondary | ICD-10-CM

## 2022-07-23 DIAGNOSIS — F5101 Primary insomnia: Secondary | ICD-10-CM | POA: Diagnosis not present

## 2022-07-23 DIAGNOSIS — K219 Gastro-esophageal reflux disease without esophagitis: Secondary | ICD-10-CM

## 2022-07-23 DIAGNOSIS — R300 Dysuria: Secondary | ICD-10-CM | POA: Diagnosis not present

## 2022-07-23 DIAGNOSIS — E7800 Pure hypercholesterolemia, unspecified: Secondary | ICD-10-CM

## 2022-07-23 LAB — URINALYSIS, COMPLETE
Bilirubin, UA: NEGATIVE
Glucose, UA: NEGATIVE
Leukocytes,UA: NEGATIVE
Nitrite, UA: NEGATIVE
Protein,UA: NEGATIVE
RBC, UA: NEGATIVE
Specific Gravity, UA: 1.025 (ref 1.005–1.030)
Urobilinogen, Ur: 0.2 mg/dL (ref 0.2–1.0)
pH, UA: 6 (ref 5.0–7.5)

## 2022-07-23 LAB — MICROSCOPIC EXAMINATION
Bacteria, UA: NONE SEEN
RBC, Urine: NONE SEEN /hpf (ref 0–2)
Renal Epithel, UA: NONE SEEN /hpf
WBC, UA: NONE SEEN /hpf (ref 0–5)

## 2022-07-23 MED ORDER — NYSTATIN 100000 UNIT/ML MT SUSP: 5.0000 mL | Freq: Four times a day (QID) | OROMUCOSAL | 0 refills | Status: DC

## 2022-07-23 MED ORDER — PANTOPRAZOLE SODIUM 40 MG PO TBEC: 40.0000 mg | DELAYED_RELEASE_TABLET | Freq: Every day | ORAL | 1 refills | Status: DC

## 2022-07-23 MED ORDER — ZOLPIDEM TARTRATE 5 MG PO TABS: 5.0000 mg | ORAL_TABLET | Freq: Every evening | ORAL | 5 refills | Status: DC | PRN

## 2022-07-23 MED ORDER — BACLOFEN 10 MG PO TABS: ORAL_TABLET | ORAL | 2 refills | Status: DC

## 2022-07-23 MED ORDER — LISINOPRIL 20 MG PO TABS: 20.0000 mg | ORAL_TABLET | Freq: Every day | ORAL | 1 refills | Status: DC

## 2022-07-23 MED ORDER — ATORVASTATIN CALCIUM 20 MG PO TABS: 20.0000 mg | ORAL_TABLET | Freq: Every day | ORAL | 1 refills | Status: DC

## 2022-07-23 NOTE — Patient Instructions (Signed)

## 2022-07-23 NOTE — Progress Notes (Signed)
Subjective:    Patient ID: Cheryl Reeves, female    DOB: 1970-11-13, 52 y.o.   MRN: [id number]   Chief Complaint: medical management of chronic issues     HPI:  Cheryl Reeves is a 52 y.o. who identifies as a female who was assigned female at birth.   Social history: Lives with: husband Work history: disability   Comes in today for follow up of the following chronic medical issues:  1. Late effect of cerebrovascular accident (CVA) 2. Spastic hemiparesis of right dominant side (HCC) 3. Seizures ([place]) 4. Foot drop, right Patient had cva years ago. She has permanent effects on her right side side. She has severe foot drop and has to walk with a cane to maintain her balance. She falls frequently. No fall in the last several months. She gets xeomin injections every 3-4 months for her spastic movements.   5. Chronic migraine w/o aura w/o status migrainosus, not intractable She has frequent migraines. She is on zonegran nightly for prevention. She is not a candidate  for triptans. 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  6. Gastroesophageal reflux disease, unspecified whether esophagitis present She takes protonix daily, which works well for her.  7. Anxiety Weaned herself off wellbutirn. Takes vistaril at night now.    07/23/2022    8:29 AM 03/30/2022   12:29 PM 02/24/2022   10:47 AM 03/24/2021   12:05 PM  GAD 7 : Generalized Anxiety Score  Nervous, Anxious, on Edge 1 1 3 $ 0  Control/stop worrying 0 1 1 1  $ Worry too much - different things 0 1 1 1  $ Trouble relaxing 0 1 0 0  Restless 0 0 1 0  Easily annoyed or irritable 1 1 1 $ 0  Afraid - awful might happen 0 0 0 0  Total GAD 7 Score 2 5 7 2  $ Anxiety Difficulty Not difficult at all Somewhat difficult Somewhat difficult Not difficult at all   8. Hypertension No c/o chest pain, sob or headache. Does not check blood pressure at home. BP Readings from Last 3  Encounters:  07/23/22 126/84  03/30/22 104/62  12/04/21 (!) 145/85   9. Hyperlipidemia Doe snot really watch diet and does no dedicated exercise Lab Results  Component Value Date   CHOL 163 03/24/2021   HDL 69 03/24/2021   LDLCALC 71 03/24/2021   TRIG 134 03/24/2021   CHOLHDL 2.4 03/24/2021      New complaints: SHe complains of: - having thrush for the last 3 weeks. She has tried nystatin which has not completely resolved her symptoms. Tongue feels raw and burns some times when she eats. -NOt sleeping well despite take hydroxyzine at night  Allergies  Allergen Reactions   Codeine     headaches   Outpatient Encounter Medications as of 07/23/2022  Medication Sig   amoxicillin-clavulanate (AUGMENTIN) 875-125 MG tablet Take 1 tablet by mouth 2 (two) times daily.   aspirin 81 MG tablet Take 81 mg by mouth daily.   atorvastatin (LIPITOR) 20 MG tablet TAKE ONE (1) TABLET BY MOUTH EVERY DAY   baclofen (LIORESAL) 10 MG tablet TAKE ONE (1) TABLET BY MOUTH TWO (2) TIMES DAILY   buPROPion (WELLBUTRIN XL) 300 MG 24 hr tablet Take 1 tablet (300 mg total) by mouth daily. (NEEDS TO BE SEEN BEFORE NEXT REFILL)   butalbital-acetaminophen-caffeine (FIORICET) 50-325-40 MG tablet Take 1 tablet by mouth every 6 (six) hours as needed  for headache.   cyclobenzaprine (FLEXERIL) 5 MG tablet Take 1 tablet (5 mg total) by mouth 3 (three) times daily as needed for muscle spasms.   fluticasone (FLONASE) 50 MCG/ACT nasal spray Place 2 sprays into both nostrils daily.   hydrOXYzine (VISTARIL) 25 MG capsule TAKE 1 CAPSULE BY MOUTH TWICE DAILY AS NEEDED   ibuprofen (ADVIL) 800 MG tablet TAKE 1 TABLET BY MOUTH EVERY 8 HOURS AS NEEDED FOR PAIN   lisinopril (ZESTRIL) 20 MG tablet TAKE ONE (1) TABLET BY MOUTH EVERY DAY   ondansetron (ZOFRAN ODT) 4 MG disintegrating tablet Take 1 tablet (4 mg total) by mouth every 8 (eight) hours as needed for nausea or vomiting.   pantoprazole (PROTONIX) 40 MG tablet Take 1  tablet (40 mg total) by mouth daily.   predniSONE (STERAPRED UNI-PAK 21 TAB) 10 MG (21) TBPK tablet As directed x 6 days   zonisamide (ZONEGRAN) 100 MG capsule TAKE 2 CAPSULES BY MOUTH AT BEDTIME   Facility-Administered Encounter Medications as of 07/23/2022  Medication   incobotulinumtoxinA (XEOMIN) 100 units injection 300 Units   incobotulinumtoxinA (XEOMIN) 100 units injection 300 Units    Past Surgical History:  Procedure Laterality Date   ARTHROSCOPY KNEE W/ DRILLING  2008   rt   REPLACEMENT TOTAL KNEE Right 2009   SEPTOPLASTY Bilateral 07/21/2013   Procedure: SEPTOPLASTY;  Surgeon: Melissa Montane, MD;  Location: [facility];  Service: ENT;  Laterality: Bilateral;   SINUS ENDO W/FUSION Bilateral 07/21/2013   Procedure: ENDOSCOPIC SINUS SURGERY WITH FUSION NAVIGATION;  Surgeon: Melissa Montane, MD;  Location: [facility];  Service: ENT;  Laterality: Bilateral;   TUBAL LIGATION  2000    Family History  Problem Relation Age of Onset   Cancer Mother        lung   Arthritis Mother    Heart disease Father    Cancer Father        lung    Hypertension Father    Heart disease Brother    Hypertension Brother    Diabetes Other    Arthritis/Rheumatoid Other    Breast cancer Neg Hx       Controlled substance contract: n/a     Review of Systems  Constitutional:  Negative for diaphoresis.  Eyes:  Negative for pain.  Respiratory:  Negative for shortness of breath.   Cardiovascular:  Negative for chest pain, palpitations and leg swelling.  Gastrointestinal:  Negative for abdominal pain.  Endocrine: Negative for polydipsia.  Skin:  Negative for rash.  Neurological:  Negative for dizziness, weakness and headaches.  Hematological:  Does not bruise/bleed easily.  All other systems reviewed and are negative.      Objective:   Physical Exam Vitals and nursing note reviewed.  Constitutional:      General: She is not in acute distress.    Appearance: Normal  appearance. She is well-developed.  HENT:     Head: Normocephalic.     Right Ear: Tympanic membrane normal.     Left Ear: Tympanic membrane normal.     Nose: Nose normal.     Mouth/Throat:     Mouth: Mucous membranes are moist.  Eyes:     Pupils: Pupils are equal, round, and reactive to light.  Neck:     Vascular: No carotid bruit or JVD.  Cardiovascular:     Rate and Rhythm: Normal rate and regular rhythm.     Heart sounds: Normal heart sounds.  Pulmonary:     Effort:  Pulmonary effort is normal. No respiratory distress.     Breath sounds: Normal breath sounds. No wheezing or rales.  Chest:     Chest wall: No tenderness.  Abdominal:     General: Bowel sounds are normal. There is no distension or abdominal bruit.     Palpations: Abdomen is soft. There is no hepatomegaly, splenomegaly, mass or pulsatile mass.     Tenderness: There is no abdominal tenderness.  Musculoskeletal:        General: Normal range of motion.     Cervical back: Normal range of motion and neck supple.     Comments: Right sided weakness  Walks with cane for stability  Lymphadenopathy:     Cervical: No cervical adenopathy.  Skin:    General: Skin is warm and dry.  Neurological:     Mental Status: She is alert and oriented to person, place, and time.     Deep Tendon Reflexes: Reflexes are normal and symmetric.  Psychiatric:        Behavior: Behavior normal.        Thought Content: Thought content normal.        Judgment: Judgment normal.     BP 126/84   Pulse 63   Temp (!) 97.5 F (36.4 C) (Temporal)   Resp 20   Ht 5' 9"$  (1.753 m)   Wt 148 lb (67.1 kg)   SpO2 (!) 85%   BMI 21.86 kg/m        Assessment & Plan:   Humaira Teresa Reeves comes in today with chief complaint of Medical Management of Chronic Issues (? Thrush in mouth)   Diagnosis and orders addressed:  1. Late effect of cerebrovascular accident (CVA) Fall prevention - CBC with Differential/Platelet - CMP14+EGFR - Lipid  panel - baclofen (LIORESAL) 10 MG tablet; TAKE ONE (1) TABLET BY MOUTH TWO (2) TIMES DAILY  Dispense: 60 each; Refill: 2  2. Spastic hemiparesis of right dominant side (HCC) 3. Seizures ([place]) 4. Foot drop, right Keep followup with neurology  5. Chronic migraine w/o aura w/o status migrainosus, not intractable  6. Gastroesophageal reflux disease, unspecified whether esophagitis present Avoid spicy foods Do not eat 2 hours prior to bedtime - pantoprazole (PROTONIX) 40 MG tablet; Take 1 tablet (40 mg total) by mouth daily.  Dispense: 90 tablet; Refill: 1  7. Anxiety Stress management  8. Thrush - nystatin (MYCOSTATIN) 100000 UNIT/ML suspension; Take 5 mLs (500,000 Units total) by mouth 4 (four) times daily.  Dispense: 60 mL; Refill: 0  9. Dysuria Urine clear - Urinalysis, Complete - Urine Culture - Urinalysis, Complete - Urine Culture  10. Primary hypertension Low sodium diet - lisinopril (ZESTRIL) 20 MG tablet; Take 1 tablet (20 mg total) by mouth daily.  Dispense: 90 tablet; Refill: 1  11. Pure hypercholesterolemia Low fat diet  - atorvastatin (LIPITOR) 20 MG tablet; Take 1 tablet (20 mg total) by mouth daily.  Dispense: 90 tablet; Refill: 1  12. Insomnia Ambien 86m I po qhs  Labs pending Health Maintenance reviewed Diet and exercise encouraged  Follow up plan: 6 months   Mary-Margaret MHassell Done FNP

## 2022-07-24 LAB — ARTHRITIS PANEL
Basophils Absolute: 0 10*3/uL (ref 0.0–0.2)
Basos: 0 %
EOS (ABSOLUTE): 0.1 10*3/uL (ref 0.0–0.4)
Eos: 3 %
Hematocrit: 42.2 % (ref 34.0–46.6)
Hemoglobin: 13.8 g/dL (ref 11.1–15.9)
Immature Grans (Abs): 0 10*3/uL (ref 0.0–0.1)
Immature Granulocytes: 0 %
Lymphocytes Absolute: 1.6 10*3/uL (ref 0.7–3.1)
Lymphs: 34 %
MCH: 31.9 pg (ref 26.6–33.0)
MCHC: 32.7 g/dL (ref 31.5–35.7)
MCV: 98 fL — ABNORMAL HIGH (ref 79–97)
Monocytes Absolute: 0.5 10*3/uL (ref 0.1–0.9)
Monocytes: 10 %
Neutrophils Absolute: 2.5 10*3/uL (ref 1.4–7.0)
Neutrophils: 53 %
Platelets: 219 10*3/uL (ref 150–450)
RBC: 4.33 x10E6/uL (ref 3.77–5.28)
RDW: 12.2 % (ref 11.7–15.4)
Rheumatoid fact SerPl-aCnc: 16.4 IU/mL — ABNORMAL HIGH (ref <14.0)
Sed Rate: 2 mm/hr (ref 0–40)
Uric Acid: 6 mg/dL (ref 3.0–7.2)
WBC: 4.8 10*3/uL (ref 3.4–10.8)

## 2022-07-24 LAB — CMP14+EGFR
ALT: 7 IU/L (ref 0–32)
AST: 14 IU/L (ref 0–40)
Albumin/Globulin Ratio: 2 (ref 1.2–2.2)
Albumin: 4.4 g/dL (ref 3.8–4.9)
Alkaline Phosphatase: 86 IU/L (ref 44–121)
BUN/Creatinine Ratio: 16 (ref 9–23)
BUN: 14 mg/dL (ref 6–24)
Bilirubin Total: 0.4 mg/dL (ref 0.0–1.2)
CO2: 23 mmol/L (ref 20–29)
Calcium: 9.4 mg/dL (ref 8.7–10.2)
Chloride: 105 mmol/L (ref 96–106)
Creatinine, Ser: 0.9 mg/dL (ref 0.57–1.00)
Globulin, Total: 2.2 g/dL (ref 1.5–4.5)
Glucose: 83 mg/dL (ref 70–99)
Potassium: 4.6 mmol/L (ref 3.5–5.2)
Sodium: 142 mmol/L (ref 134–144)
Total Protein: 6.6 g/dL (ref 6.0–8.5)
eGFR: 77 mL/min/{1.73_m2} (ref >59)

## 2022-07-24 LAB — LIPID PANEL
Chol/HDL Ratio: 3.2 ratio (ref 0.0–4.4)
Cholesterol, Total: 145 mg/dL (ref 100–199)
HDL: 45 mg/dL (ref >39)
LDL Chol Calc (NIH): 83 mg/dL (ref 0–99)
Triglycerides: 87 mg/dL (ref 0–149)
VLDL Cholesterol Cal: 17 mg/dL (ref 5–40)

## 2022-07-26 LAB — URINE CULTURE

## 2022-08-06 ENCOUNTER — Encounter: Payer: Self-pay | Admitting: Radiology

## 2022-08-13 ENCOUNTER — Encounter: Payer: Self-pay | Admitting: Nurse Practitioner

## 2022-08-13 ENCOUNTER — Ambulatory Visit (INDEPENDENT_AMBULATORY_CARE_PROVIDER_SITE_OTHER): Payer: PPO | Admitting: Nurse Practitioner

## 2022-08-13 VITALS — BP 145/90 | HR 87 | Temp 97.7°F | Resp 20 | Ht 69.0 in | Wt 145.0 lb

## 2022-08-13 DIAGNOSIS — K146 Glossodynia: Secondary | ICD-10-CM

## 2022-08-13 NOTE — Progress Notes (Signed)
   Subjective:    Patient ID: Cheryl Reeves, female    DOB: [birth year], 52 y.o.   MRN: [id number]   Chief Complaint: sore throat  HPI Patient c/o mouth burning with a metalic taste. She has been using nystatin which has not helped.  Patient Active Problem List   Diagnosis Date Noted   Anxiety 07/23/2022   Primary hypertension 07/23/2022   Pure hypercholesterolemia 07/23/2022   Spastic hemiparesis of right dominant side ([place]) 09/08/2021   Late effect of cerebrovascular accident (CVA) 09/08/2021   Chronic migraine w/o aura w/o status migrainosus, not intractable 01/27/2021   Acute pyelonephritis 01/17/2021   History of multiple allergies 01/31/2018   Compression fracture of thoracic vertebra ([place]) 09/27/2017   Carpal tunnel syndrome of left wrist 10/16/2016   Allergic rhinitis 10/09/2013   GERD (gastroesophageal reflux disease) 10/09/2013   Foot drop, right 10/09/2013   Rosacea, acne 10/09/2013   Seizures ([place]) 10/09/2013        Review of Systems  Constitutional:  Negative for diaphoresis.  Eyes:  Negative for pain.  Respiratory:  Negative for shortness of breath.   Cardiovascular:  Negative for chest pain, palpitations and leg swelling.  Gastrointestinal:  Negative for abdominal pain.  Endocrine: Negative for polydipsia.  Skin:  Negative for rash.  Neurological:  Negative for dizziness, weakness and headaches.  Hematological:  Does not bruise/bleed easily.  All other systems reviewed and are negative.      Objective:   Physical Exam Vitals reviewed.  Constitutional:      Appearance: Normal appearance.  HENT:     Mouth/Throat:     Comments: Tongue has light yellowish coating that is do not feel is significant. Cardiovascular:     Rate and Rhythm: Normal rate and regular rhythm.     Heart sounds: Normal heart sounds.  Pulmonary:     Breath sounds: Normal breath sounds.  Neurological:     Mental Status: She is alert.    BP (!) 145/90   Pulse 87   Temp  97.7 F (36.5 C) (Temporal)   Resp 20   Ht '5\' 9"'$  (1.753 m)   Wt 145 lb (65.8 kg)   SpO2 99%   BMI 21.41 kg/m         Assessment & Plan:   Cheryl Reeves in today with chief complaint of mouth burning and has metallic taste   1. Burning mouth syndrome Drinking more fluids Avoiding tobacco Avoiding alcohol Avoiding spicy foods Nutrition Foods to eat:  Foods rich in vitamin B6 and B12 such as meat, eggs, milk, mussels and oysters Foods rich in folic acid such as leafy green vegetables and poultry Take plenty of fluids Foods to avoid:  Acidic foods such as tomatoes, orange juice Spicy foods Carbonated beverages such as soda Avoid tobacco  Orders Placed This Encounter  Procedures   Anemia Profile B   Vitamin B12     The above assessment and management plan was discussed with the patient. The patient verbalized understanding of and has agreed to the management plan. Patient is aware to call the clinic if symptoms persist or worsen. Patient is aware when to return to the clinic for a follow-up visit. Patient educated on when it is appropriate to go to the emergency department.   Mary-Margaret Hassell Done, FNP

## 2022-08-13 NOTE — Patient Instructions (Signed)
   Burning mouth syndrome  Drinking more fluids Avoiding tobacco Avoiding alcohol Avoiding spicy foods Nutrition Foods to eat:  Foods rich in vitamin B6 and B12 such as meat, eggs, milk, mussels and oysters Foods rich in folic acid such as leafy green vegetables and poultry Take plenty of fluids Foods to avoid:  Acidic foods such as tomatoes, orange juice Spicy foods Carbonated beverages such as soda Avoid tobacco

## 2022-08-15 LAB — ANEMIA PROFILE B
Basophils Absolute: 0 10*3/uL (ref 0.0–0.2)
Basos: 0 %
EOS (ABSOLUTE): 0.1 10*3/uL (ref 0.0–0.4)
Eos: 2 %
Ferritin: 105 ng/mL (ref 15–150)
Folate: 12.8 ng/mL (ref >3.0)
Hematocrit: 44.3 % (ref 34.0–46.6)
Hemoglobin: 14.7 g/dL (ref 11.1–15.9)
Immature Grans (Abs): 0 10*3/uL (ref 0.0–0.1)
Immature Granulocytes: 0 %
Iron Saturation: 37 % (ref 15–55)
Iron: 110 ug/dL (ref 27–159)
Lymphocytes Absolute: 1.8 10*3/uL (ref 0.7–3.1)
Lymphs: 22 %
MCH: 32.7 pg (ref 26.6–33.0)
MCHC: 33.2 g/dL (ref 31.5–35.7)
MCV: 98 fL — ABNORMAL HIGH (ref 79–97)
Monocytes Absolute: 0.5 10*3/uL (ref 0.1–0.9)
Monocytes: 6 %
Neutrophils Absolute: 5.6 10*3/uL (ref 1.4–7.0)
Neutrophils: 70 %
Platelets: 212 10*3/uL (ref 150–450)
RBC: 4.5 x10E6/uL (ref 3.77–5.28)
RDW: 12.1 % (ref 11.7–15.4)
Retic Ct Pct: 1.4 % (ref 0.6–2.6)
Total Iron Binding Capacity: 295 ug/dL (ref 250–450)
UIBC: 185 ug/dL (ref 131–425)
Vitamin B-12: 342 pg/mL (ref 232–1245)
WBC: 8 10*3/uL (ref 3.4–10.8)

## 2022-09-09 ENCOUNTER — Telehealth: Payer: Self-pay | Admitting: Nurse Practitioner

## 2022-09-09 NOTE — Telephone Encounter (Signed)
Contacted Bodhi Teresa [place] to schedule their annual wellness visit. Appointment made for 09/16/2022.   Thank you,  Colletta [place],  [facility] Direct Dial ??[phone]

## 2022-09-16 ENCOUNTER — Ambulatory Visit (INDEPENDENT_AMBULATORY_CARE_PROVIDER_SITE_OTHER): Payer: PPO

## 2022-09-16 VITALS — Ht 64.0 in | Wt 145.0 lb

## 2022-09-16 DIAGNOSIS — Z0000 Encounter for general adult medical examination without abnormal findings: Secondary | ICD-10-CM

## 2022-09-16 DIAGNOSIS — Z1231 Encounter for screening mammogram for malignant neoplasm of breast: Secondary | ICD-10-CM

## 2022-09-16 NOTE — Patient Instructions (Signed)
Cheryl Reeves , Thank you for taking time to come for your Medicare Wellness Visit. I appreciate your ongoing commitment to your health goals. Please review the following plan we discussed and let me know if I can assist you in the future.   These are the goals we discussed:  Goals      DIET - INCREASE WATER INTAKE     Exercise 3x per week (30 min per time)     08/25/21-Encouraged pt to try chair exercises.         This is a list of the screening recommended for you and due dates:  Health Maintenance  Topic Date Due   COVID-19 Vaccine (1) Never done   Colon Cancer Screening  Never done   Zoster (Shingles) Vaccine (1 of 2) Never done   Mammogram  03/28/2023*   Flu Shot  01/07/2023   Medicare Annual Wellness Visit  09/16/2023   Pap Smear  09/17/2023   DTaP/Tdap/Td vaccine (2 - Td or Tdap) 09/02/2027   Hepatitis C Screening: USPSTF Recommendation to screen - Ages 53-79 yo.  Completed   HIV Screening  Completed   HPV Vaccine  Aged Out  *Topic was postponed. The date shown is not the original due date.    Advanced directives: Advance directive discussed with you today. I have provided a copy for you to complete at home and have notarized. Once this is complete please bring a copy in to our office so we can scan it into your chart.   Conditions/risks identified: Aim for 30 minutes of exercise or brisk walking, 6-8 glasses of water, and 5 servings of fruits and vegetables each day.   Next appointment: Follow up in one year for your annual wellness visit.   Preventive Care 40-64 Years, Female Preventive care refers to lifestyle choices and visits with your health care provider that can promote health and wellness. What does preventive care include? A yearly physical exam. This is also called an annual well check. Dental exams once or twice a year. Routine eye exams. Ask your health care provider how often you should have your eyes checked. Personal lifestyle choices, including: Daily  care of your teeth and gums. Regular physical activity. Eating a healthy diet. Avoiding tobacco and drug use. Limiting alcohol use. Practicing safe sex. Taking low-dose aspirin daily starting at age 15. Taking vitamin and mineral supplements as recommended by your health care provider. What happens during an annual well check? The services and screenings done by your health care provider during your annual well check will depend on your age, overall health, lifestyle risk factors, and family history of disease. Counseling  Your health care provider may ask you questions about your: Alcohol use. Tobacco use. Drug use. Emotional well-being. Home and relationship well-being. Sexual activity. Eating habits. Work and work Astronomer. Method of birth control. Menstrual cycle. Pregnancy history. Screening  You may have the following tests or measurements: Height, weight, and BMI. Blood pressure. Lipid and cholesterol levels. These may be checked every 5 years, or more frequently if you are over 47 years old. Skin check. Lung cancer screening. You may have this screening every year starting at age 70 if you have a 30-pack-year history of smoking and currently smoke or have quit within the past 15 years. Fecal occult blood test (FOBT) of the stool. You may have this test every year starting at age 68. Flexible sigmoidoscopy or colonoscopy. You may have a sigmoidoscopy every 5 years or a colonoscopy every 10  years starting at age 84. Hepatitis C blood test. Hepatitis B blood test. Sexually transmitted disease (STD) testing. Diabetes screening. This is done by checking your blood sugar (glucose) after you have not eaten for a while (fasting). You may have this done every 1-3 years. Mammogram. This may be done every 1-2 years. Talk to your health care provider about when you should start having regular mammograms. This may depend on whether you have a family history of breast  cancer. BRCA-related cancer screening. This may be done if you have a family history of breast, ovarian, tubal, or peritoneal cancers. Pelvic exam and Pap test. This may be done every 3 years starting at age 54. Starting at age 59, this may be done every 5 years if you have a Pap test in combination with an HPV test. Bone density scan. This is done to screen for osteoporosis. You may have this scan if you are at high risk for osteoporosis. Discuss your test results, treatment options, and if necessary, the need for more tests with your health care provider. Vaccines  Your health care provider may recommend certain vaccines, such as: Influenza vaccine. This is recommended every year. Tetanus, diphtheria, and acellular pertussis (Tdap, Td) vaccine. You may need a Td booster every 10 years. Zoster vaccine. You may need this after age 22. Pneumococcal 13-valent conjugate (PCV13) vaccine. You may need this if you have certain conditions and were not previously vaccinated. Pneumococcal polysaccharide (PPSV23) vaccine. You may need one or two doses if you smoke cigarettes or if you have certain conditions. Talk to your health care provider about which screenings and vaccines you need and how often you need them. This information is not intended to replace advice given to you by your health care provider. Make sure you discuss any questions you have with your health care provider. Document Released: 06/21/2015 Document Revised: 02/12/2016 Document Reviewed: 03/26/2015 Elsevier Interactive Patient Education  2017 [employer].    Fall Prevention in the Home Falls can cause injuries. They can happen to people of all ages. There are many things you can do to make your home safe and to help prevent falls. What can I do on the outside of my home? Regularly fix the edges of walkways and driveways and fix any cracks. Remove anything that might make you trip as you walk through a door, such as a raised step  or threshold. Trim any bushes or trees on the path to your home. Use bright outdoor lighting. Clear any walking paths of anything that might make someone trip, such as rocks or tools. Regularly check to see if handrails are loose or broken. Make sure that both sides of any steps have handrails. Any raised decks and porches should have guardrails on the edges. Have any leaves, snow, or ice cleared regularly. Use sand or salt on walking paths during winter. Clean up any spills in your garage right away. This includes oil or grease spills. What can I do in the bathroom? Use night lights. Install grab bars by the toilet and in the tub and shower. Do not use towel bars as grab bars. Use non-skid mats or decals in the tub or shower. If you need to sit down in the shower, use a plastic, non-slip stool. Keep the floor dry. Clean up any water that spills on the floor as soon as it happens. Remove soap buildup in the tub or shower regularly. Attach bath mats securely with double-sided non-slip rug tape. Do not have  throw rugs and other things on the floor that can make you trip. What can I do in the bedroom? Use night lights. Make sure that you have a light by your bed that is easy to reach. Do not use any sheets or blankets that are too big for your bed. They should not hang down onto the floor. Have a firm chair that has side arms. You can use this for support while you get dressed. Do not have throw rugs and other things on the floor that can make you trip. What can I do in the kitchen? Clean up any spills right away. Avoid walking on wet floors. Keep items that you use a lot in easy-to-reach places. If you need to reach something above you, use a strong step stool that has a grab bar. Keep electrical cords out of the way. Do not use floor polish or wax that makes floors slippery. If you must use wax, use non-skid floor wax. Do not have throw rugs and other things on the floor that can make  you trip. What can I do with my stairs? Do not leave any items on the stairs. Make sure that there are handrails on both sides of the stairs and use them. Fix handrails that are broken or loose. Make sure that handrails are as long as the stairways. Check any carpeting to make sure that it is firmly attached to the stairs. Fix any carpet that is loose or worn. Avoid having throw rugs at the top or bottom of the stairs. If you do have throw rugs, attach them to the floor with carpet tape. Make sure that you have a light switch at the top of the stairs and the bottom of the stairs. If you do not have them, ask someone to add them for you. What else can I do to help prevent falls? Wear shoes that: Do not have high heels. Have rubber bottoms. Are comfortable and fit you well. Are closed at the toe. Do not wear sandals. If you use a stepladder: Make sure that it is fully opened. Do not climb a closed stepladder. Make sure that both sides of the stepladder are locked into place. Ask someone to hold it for you, if possible. Clearly mark and make sure that you can see: Any grab bars or handrails. First and last steps. Where the edge of each step is. Use tools that help you move around (mobility aids) if they are needed. These include: Canes. Walkers. Scooters. Crutches. Turn on the lights when you go into a dark area. Replace any light bulbs as soon as they burn out. Set up your furniture so you have a clear path. Avoid moving your furniture around. If any of your floors are uneven, fix them. If there are any pets around you, be aware of where they are. Review your medicines with your doctor. Some medicines can make you feel dizzy. This can increase your chance of falling. Ask your doctor what other things that you can do to help prevent falls. This information is not intended to replace advice given to you by your health care provider. Make sure you discuss any questions you have with your  health care provider. Document Released: 03/21/2009 Document Revised: 10/31/2015 Document Reviewed: 06/29/2014 Elsevier Interactive Patient Education  2017 [employer]

## 2022-09-16 NOTE — Progress Notes (Signed)
Subjective:   Cheryl Reeves is a 52 y.o. female who presents for Medicare Annual (Subsequent) preventive examination. I connected with  Cheryl Reeves on 09/16/22 by a audio enabled telemedicine application and verified that I am speaking with the correct person using two identifiers.  Patient Location: Home  Provider Location: Home Office  I discussed the limitations of evaluation and management by telemedicine. The patient expressed understanding and agreed to proceed.  Review of Systems     Cardiac Risk Factors include: advanced age (>16men, >7 women)     Objective:    Today's Vitals   09/16/22 1319  Weight: 145 lb (65.8 kg)  Height: 5\' 4"  (1.626 m)   Body mass index is 24.89 kg/m.     09/16/2022    1:24 PM 08/25/2021    2:50 PM 01/17/2021    7:21 AM 01/16/2021    7:13 AM 01/07/2021    2:17 PM 11/07/2020    9:07 AM 08/13/2020    2:14 PM  Advanced Directives  Does Patient Have a Medical Advance Directive? No No No No No No No  Would patient like information on creating a medical advance directive? No - Patient declined No - Patient declined No - Patient declined No - Patient declined   No - Patient declined    Current Medications (verified) Outpatient Encounter Medications as of 09/16/2022  Medication Sig   aspirin 81 MG tablet Take 81 mg by mouth daily.   atorvastatin (LIPITOR) 20 MG tablet Take 1 tablet (20 mg total) by mouth daily.   baclofen (LIORESAL) 10 MG tablet TAKE ONE (1) TABLET BY MOUTH TWO (2) TIMES DAILY   fluticasone (FLONASE) 50 MCG/ACT nasal spray Place 2 sprays into both nostrils daily.   hydrOXYzine (VISTARIL) 25 MG capsule TAKE 1 CAPSULE BY MOUTH TWICE DAILY AS NEEDED   ibuprofen (ADVIL) 800 MG tablet TAKE 1 TABLET BY MOUTH EVERY 8 HOURS AS NEEDED FOR PAIN   lisinopril (ZESTRIL) 20 MG tablet Take 1 tablet (20 mg total) by mouth daily.   ondansetron (ZOFRAN ODT) 4 MG disintegrating tablet Take 1 tablet (4 mg total) by mouth every 8 (eight) hours  as needed for nausea or vomiting.   pantoprazole (PROTONIX) 40 MG tablet Take 1 tablet (40 mg total) by mouth daily.   zolpidem (AMBIEN) 5 MG tablet Take 1 tablet (5 mg total) by mouth at bedtime as needed for sleep.   zonisamide (ZONEGRAN) 100 MG capsule TAKE 2 CAPSULES BY MOUTH AT BEDTIME   Facility-Administered Encounter Medications as of 09/16/2022  Medication   incobotulinumtoxinA (XEOMIN) 100 units injection 300 Units   incobotulinumtoxinA (XEOMIN) 100 units injection 300 Units    Allergies (verified) Codeine   History: Past Medical History:  Diagnosis Date   Allergy    DJD (degenerative joint disease)    GERD (gastroesophageal reflux disease)    Headache    Stroke 1999   S/p childbirth-weakness rt leg   Thoracic injuries    Wears glasses    Past Surgical History:  Procedure Laterality Date   ARTHROSCOPY KNEE W/ DRILLING  2008   rt   REPLACEMENT TOTAL KNEE Right 2009   SEPTOPLASTY Bilateral 07/21/2013   Procedure: SEPTOPLASTY;  Surgeon: Suzanna Obey, MD;  Location: [facility];  Service: ENT;  Laterality: Bilateral;   SINUS ENDO W/FUSION Bilateral 07/21/2013   Procedure: ENDOSCOPIC SINUS SURGERY WITH FUSION NAVIGATION;  Surgeon: Suzanna Obey, MD;  Location: [facility];  Service: ENT;  Laterality: Bilateral;  TUBAL LIGATION  2000   Family History  Problem Relation Age of Onset   Cancer Mother        lung   Arthritis Mother    Heart disease Father    Cancer Father        lung    Hypertension Father    Heart disease Brother    Hypertension Brother    Diabetes Other    Arthritis/Rheumatoid Other    Breast cancer Neg Hx    Social History   Socioeconomic History   Marital status: Married    Spouse name: Zella [place]   Number of children: 2   Years of education: 10   Highest education level: 10th grade  Occupational History   Occupation: Disabled  Tobacco Use   Smoking status: Every Day    Packs/day: 0.50    Years: 30.00    Additional  pack years: 0.00    Total pack years: 15.00    Types: Cigarettes   Smokeless tobacco: Never  Vaping Use   Vaping Use: Never used  Substance and Sexual Activity   Alcohol use: Yes    Alcohol/week: 4.0 standard drinks of alcohol    Types: 4 Cans of beer per week    Comment: occ   Drug use: Yes    Types: Marijuana    Comment: occasionally   Sexual activity: Yes  Other Topics Concern   Not on file  Social History Narrative   Lives at home with son   Married, 1 son and 1 stepson , 1 daughter    Right handed   10 th grade   3-4 cups caffeine per day.   Social Determinants of Health   Financial Resource Strain: Low Risk  (09/16/2022)   Overall Financial Resource Strain (CARDIA)    Difficulty of Paying Living Expenses: Not hard at all  Food Insecurity: No Food Insecurity (09/16/2022)   Hunger Vital Sign    Worried About Running Out of Food in the Last Year: Never true    Ran Out of Food in the Last Year: Never true  Transportation Needs: No Transportation Needs (09/16/2022)   PRAPARE - Administrator, Civil Service (Medical): No    Lack of Transportation (Non-Medical): No  Physical Activity: Insufficiently Active (09/16/2022)   Exercise Vital Sign    Days of Exercise per Week: 3 days    Minutes of Exercise per Session: 30 min  Stress: No Stress Concern Present (09/16/2022)   [employer] of Occupational Health - Occupational Stress Questionnaire    Feeling of Stress : Not at all  Social Connections: Moderately Integrated (09/16/2022)   Social Connection and Isolation Panel [NHANES]    Frequency of Communication with Friends and Family: More than three times a week    Frequency of Social Gatherings with Friends and Family: More than three times a week    Attends Religious Services: More than 4 times per year    Active Member of [employer] or Organizations: No    Attends Engineer, structural: Never    Marital Status: Married    Tobacco Counseling Ready to  quit: No Counseling given: Not Answered   Clinical Intake:  Pre-visit preparation completed: Yes  Pain : No/denies pain     Nutritional Risks: None Diabetes: No  How often do you need to have someone help you when you read instructions, pamphlets, or other written materials from your doctor or pharmacy?: 1 - Never  Diabetic?no   Interpreter Needed?: No  Information entered by :: Renie Ora, LPN   Activities of Daily Living    09/16/2022    1:24 PM  In your present state of health, do you have any difficulty performing the following activities:  Hearing? 0  Vision? 0  Difficulty concentrating or making decisions? 0  Walking or climbing stairs? 0  Dressing or bathing? 0  Doing errands, shopping? 0  Preparing Food and eating ? N  Using the Toilet? N  In the past six months, have you accidently leaked urine? N  Do you have problems with loss of bowel control? N  Managing your Medications? N  Managing your Finances? N  Housekeeping or managing your Housekeeping? N    Patient Care Team: Bennie Pierini, FNP as PCP - General (Family Medicine) Levert Feinstein, MD as Consulting Physician (Neurology)  Indicate any recent Medical Services you may have received from other than [facility] providers in the past year (date may be approximate).     Assessment:   This is a routine wellness examination for Cheryl Reeves.  Hearing/Vision screen Vision Screening - Comments:: Wears rx glasses - up to date with routine eye exams with  Dr.Lee   Dietary issues and exercise activities discussed: Current Exercise Habits: Home exercise routine, Type of exercise: walking, Time (Minutes): 30, Frequency (Times/Week): 3, Weekly Exercise (Minutes/Week): 90, Intensity: Mild, Exercise limited by: None identified   Goals Addressed             This Visit's Progress    DIET - INCREASE WATER INTAKE   On track      Depression Screen    09/16/2022    1:23 PM 08/13/2022   10:38 AM 07/23/2022     8:28 AM 03/30/2022   12:29 PM 08/25/2021    2:47 PM 03/24/2021   12:05 PM 01/27/2021    2:19 PM  PHQ 2/9 Scores  PHQ - 2 Score 0 0 0 1 0 0 0  PHQ- 9 Score 0 2 3 2  2 2     Fall Risk    09/16/2022    1:21 PM 08/13/2022   10:38 AM 07/23/2022    8:28 AM 03/30/2022   12:29 PM 08/25/2021    2:52 PM  Fall Risk   Falls in the past year? 0 0 0 1 0  Number falls in past yr: 0   1 0  Injury with Fall? 0   1 0  Risk for fall due to : No Fall Risks   History of fall(s) Impaired mobility;Impaired balance/gait  Follow up Falls prevention discussed   Education provided Falls prevention discussed    FALL RISK PREVENTION PERTAINING TO THE HOME:  Any stairs in or around the home? No  If so, are there any without handrails? No  Home free of loose throw rugs in walkways, pet beds, electrical cords, etc? Yes  Adequate lighting in your home to reduce risk of falls? Yes   ASSISTIVE DEVICES UTILIZED TO PREVENT FALLS:  Life alert? No  Use of a cane, walker or w/c? Yes  Grab bars in the bathroom? No  Shower chair or bench in shower? No  Elevated toilet seat or a handicapped toilet? No       02/24/2018    8:27 AM  MMSE - Mini Mental State Exam  Orientation to time 5  Orientation to Place 5  Registration 3  Attention/ Calculation 5  Recall 3  Language- name 2 objects 2  Language- repeat 1  Language- follow 3 step command  3  Language- read & follow direction 1  Write a sentence 1  Copy design 1  Total score 30        09/16/2022    1:24 PM 08/13/2020    2:19 PM  6CIT Screen  What Year? 0 points 0 points  What month? 0 points 0 points  What time? 0 points 0 points  Count back from 20 0 points 0 points  Months in reverse 0 points 0 points  Repeat phrase 0 points 0 points  Total Score 0 points 0 points    Immunizations Immunization History  Administered Date(s) Administered   Influenza Split 03/15/2013   Tdap 09/01/2017    TDAP status: Up to date  Flu Vaccine status: Declined,  Education has been provided regarding the importance of this vaccine but patient still declined. Advised may receive this vaccine at local pharmacy or Health Dept. Aware to provide a copy of the vaccination record if obtained from local pharmacy or Health Dept. Verbalized acceptance and understanding.  Pneumococcal vaccine status: Due, Education has been provided regarding the importance of this vaccine. Advised may receive this vaccine at local pharmacy or Health Dept. Aware to provide a copy of the vaccination record if obtained from local pharmacy or Health Dept. Verbalized acceptance and understanding.  Covid-19 vaccine status: Completed vaccines  Qualifies for Shingles Vaccine? Yes   Zostavax completed No   Shingrix Completed?: No.    Education has been provided regarding the importance of this vaccine. Patient has been advised to call insurance company to determine out of pocket expense if they have not yet received this vaccine. Advised may also receive vaccine at local pharmacy or Health Dept. Verbalized acceptance and understanding.  Screening Tests Health Maintenance  Topic Date Due   COVID-19 Vaccine (1) Never done   COLONOSCOPY (Pts 45-48yrs Insurance coverage will need to be confirmed)  Never done   Zoster Vaccines- Shingrix (1 of 2) Never done   MAMMOGRAM  03/28/2023 (Originally 01/01/2022)   INFLUENZA VACCINE  01/07/2023   Medicare Annual Wellness (AWV)  09/16/2023   PAP SMEAR-Modifier  09/17/2023   DTaP/Tdap/Td (2 - Td or Tdap) 09/02/2027   Hepatitis C Screening  Completed   HIV Screening  Completed   HPV VACCINES  Aged Out    Health Maintenance  Health Maintenance Due  Topic Date Due   COVID-19 Vaccine (1) Never done   COLONOSCOPY (Pts 45-30yrs Insurance coverage will need to be confirmed)  Never done   Zoster Vaccines- Shingrix (1 of 2) Never done    Colorectal cancer screening: Referral to GI placed declined . Pt aware the office will call re: appt.  Mammogram  status: Ordered 09/16/2022. Pt provided with contact info and advised to call to schedule appt.   Bone Density status: Ordered not of age . Pt provided with contact info and advised to call to schedule appt.  Lung Cancer Screening: (Low Dose CT Chest recommended if Age 32-80 years, 30 pack-year currently smoking OR have quit w/in 15years.) does qualify.   Lung Cancer Screening Referral: declined   Additional Screening:  Hepatitis C Screening: does not qualify;   Vision Screening: Recommended annual ophthalmology exams for early detection of glaucoma and other disorders of the eye. Is the patient up to date with their annual eye exam?  Yes  Who is the provider or what is the name of the office in which the patient attends annual eye exams? Dr.Lee  If pt is not established with a provider,  would they like to be referred to a provider to establish care? No .   Dental Screening: Recommended annual dental exams for proper oral hygiene  Community Resource Referral / Chronic Care Management: CRR required this visit?  No   CCM required this visit?  No      Plan:     I have personally reviewed and noted the following in the patient's chart:   Medical and social history Use of alcohol, tobacco or illicit drugs  Current medications and supplements including opioid prescriptions. Patient is not currently taking opioid prescriptions. Functional ability and status Nutritional status Physical activity Advanced directives List of other physicians Hospitalizations, surgeries, and ER visits in previous 12 months Vitals Screenings to include cognitive, depression, and falls Referrals and appointments  In addition, I have reviewed and discussed with patient certain preventive protocols, quality metrics, and best practice recommendations. A written personalized care plan for preventive services as well as general preventive health recommendations were provided to patient.     Lorrene ReidLaura L  Wilson, LPN   1/61/09604/03/2023   Nurse Notes: Due pneumonia Vaccine

## 2022-10-15 ENCOUNTER — Telehealth: Payer: Self-pay

## 2022-10-15 DIAGNOSIS — M2550 Pain in unspecified joint: Secondary | ICD-10-CM

## 2022-10-15 NOTE — Telephone Encounter (Signed)
Patient is requesting referral to rheumatologist - states that she has discussed this with PCP and symptoms are getting worse. Patient would like to stay in [place] or [place].

## 2022-10-16 NOTE — Addendum Note (Signed)
Addended by: Bennie Pierini on: 10/16/2022 12:30 PM   Modules accepted: Orders

## 2022-10-16 NOTE — Telephone Encounter (Signed)
Pt made aware

## 2022-10-16 NOTE — Telephone Encounter (Signed)
Referral done

## 2022-11-04 ENCOUNTER — Inpatient Hospital Stay: Admission: RE | Admit: 2022-11-04 | Payer: PPO | Source: Ambulatory Visit

## 2022-11-27 ENCOUNTER — Other Ambulatory Visit: Payer: Self-pay | Admitting: Neurology

## 2022-12-14 ENCOUNTER — Encounter: Payer: Self-pay | Admitting: Nurse Practitioner

## 2022-12-14 ENCOUNTER — Ambulatory Visit (INDEPENDENT_AMBULATORY_CARE_PROVIDER_SITE_OTHER): Payer: PPO

## 2022-12-14 ENCOUNTER — Ambulatory Visit (INDEPENDENT_AMBULATORY_CARE_PROVIDER_SITE_OTHER): Payer: PPO | Admitting: Nurse Practitioner

## 2022-12-14 VITALS — BP 131/88 | HR 74 | Temp 97.6°F | Resp 20 | Ht 64.0 in | Wt 145.0 lb

## 2022-12-14 DIAGNOSIS — W1809XA Striking against other object with subsequent fall, initial encounter: Secondary | ICD-10-CM

## 2022-12-14 DIAGNOSIS — M79671 Pain in right foot: Secondary | ICD-10-CM

## 2022-12-14 DIAGNOSIS — Z043 Encounter for examination and observation following other accident: Secondary | ICD-10-CM | POA: Diagnosis not present

## 2022-12-14 DIAGNOSIS — M25511 Pain in right shoulder: Secondary | ICD-10-CM

## 2022-12-14 DIAGNOSIS — M19071 Primary osteoarthritis, right ankle and foot: Secondary | ICD-10-CM | POA: Diagnosis not present

## 2022-12-14 MED ORDER — CELECOXIB 200 MG PO CAPS: 200.0000 mg | ORAL_CAPSULE | Freq: Two times a day (BID) | ORAL | 1 refills | Status: DC

## 2022-12-14 NOTE — Patient Instructions (Signed)
Shoulder Pain Many things can cause shoulder pain, including: An injury. Moving the shoulder in the same way again and again (overuse). Joint pain (arthritis). Pain can come from: Swelling and irritation (inflammation) of any part of the shoulder. An injury to: The shoulder joint. Tissues that connect muscle to bone (tendons). Tissues that connect bones to each other (ligaments). Bones. Follow these instructions at home: Watch for changes in your symptoms. Let your doctor know about them. Follow these instructions to help with your pain. If you have a sling that can be taken off: Wear the sling as told by your doctor. Take it off only as told by your doctor. Check the skin around the sling every day. Tell your doctor if you see problems. Loosen the sling if your fingers: Tingle. Become numb. Become cold. Keep the sling clean. If the sling is not waterproof: Do not let it get wet. Take the sling off when you shower or bathe. Managing pain, stiffness, and swelling  If told, put ice on the painful area. Put ice in a plastic bag. Place a towel between your skin and the bag. Leave the ice on for 20 minutes, 2-3 times a day. Stop putting ice on if it does not help with the pain. If your skin turns bright red, take off the ice right away to prevent skin damage. The risk of damage is higher if you cannot feel pain, heat, or cold. Squeeze a soft ball or a foam pad as much as possible. This prevents swelling in the shoulder. It also helps to strengthen the arm. General instructions Take over-the-counter and prescription medicines only as told by your doctor. Keep all follow-up visits. This will help you avoid any type of permanent shoulder problems. Contact a doctor if: Your pain gets worse. Medicine does not help your pain. You have new pain in your arm, hand, or fingers. You loosen your sling and your arm, hand, or fingers: Tingle. Are numb. Are swollen. Get help right away  if: Your arm, hand, or fingers turn white or blue. This information is not intended to replace advice given to you by your health care provider. Make sure you discuss any questions you have with your health care provider. Document Revised: 12/26/2021 Document Reviewed: 12/26/2021 Elsevier Patient Education  2024 Elsevier Inc.  

## 2022-12-14 NOTE — Progress Notes (Signed)
   Subjective:    Patient ID: Cheryl Reeves, female    DOB: 1970/08/06, 52 y.o.   MRN: [id number]   Chief Complaint: fall  Shoulder Pain   Foot Pain Associated symptoms include arthralgias (right ankle and right shoulder).    Patient tripped over a stump in her yard a couple of weeks ago. She landed on her right side and injuried her shoulder. Hurts to move. Rates shoulder pain 5-6/10. She also fell memorial day weekend and hurt her right ankle. No better. Rates ankle pain 7/10. Worse when walking.  Patient Active Problem List   Diagnosis Date Noted   Anxiety 07/23/2022   Primary hypertension 07/23/2022   Pure hypercholesterolemia 07/23/2022   Spastic hemiparesis of right dominant side (HCC) 09/08/2021   Late effect of cerebrovascular accident (CVA) 09/08/2021   Chronic migraine w/o aura w/o status migrainosus, not intractable 01/27/2021   Acute pyelonephritis 01/17/2021   History of multiple allergies 01/31/2018   Compression fracture of thoracic vertebra (HCC) 09/27/2017   Carpal tunnel syndrome of left wrist 10/16/2016   Allergic rhinitis 10/09/2013   GERD (gastroesophageal reflux disease) 10/09/2013   Foot drop, right 10/09/2013   Rosacea, acne 10/09/2013   Seizures (HCC) 10/09/2013       Review of Systems  Musculoskeletal:  Positive for arthralgias (right ankle and right shoulder).       Objective:   Physical Exam Constitutional:      Appearance: Normal appearance.  Musculoskeletal:     Comments: FROM of right shoulder with slight pain on movement in any direction. FROM of right ankle with pain on flexion and extension.  Skin:    General: Skin is warm.  Neurological:     General: No focal deficit present.     Mental Status: She is alert and oriented to person, place, and time.  Psychiatric:        Mood and Affect: Mood normal.        Behavior: Behavior normal.     BP 131/88   Pulse 74   Temp 97.6 F (36.4 C) (Temporal)   Resp 20   Ht 5\' 4"   (1.626 m)   Wt 145 lb (65.8 kg)   SpO2 100%   BMI 24.89 kg/m   Ankle xray- normal-Preliminary reading by Paulene Floor, FNP  [facility]  Shoulder xray- normal-Preliminary reading by Paulene Floor, FNP  [facility]      Assessment & Plan:   Cheryl Reeves in today with chief complaint of Shoulder Pain (Right shoulder. Fell last week/) and Foot Pain (Right foot. Twisted stepping off of porch)   1. Right foot pain - DG Foot Complete Right  2. Acute pain of right shoulder - DG Shoulder Right  Continue to ice Rest Wear wrap on ankle Follow up prn.  The above assessment and management plan was discussed with the patient. The patient verbalized understanding of and has agreed to the management plan. Patient is aware to call the clinic if symptoms persist or worsen. Patient is aware when to return to the clinic for a follow-up visit. Patient educated on when it is appropriate to go to the emergency department.   [name], FNP

## 2022-12-23 ENCOUNTER — Other Ambulatory Visit: Payer: Self-pay | Admitting: Nurse Practitioner

## 2022-12-23 DIAGNOSIS — F419 Anxiety disorder, unspecified: Secondary | ICD-10-CM

## 2022-12-23 NOTE — Telephone Encounter (Signed)
Normal shoulder xray, meaning no fracture or other abnormality seen. Mild osteoarthritis seen on joint in foot that connects ankle to Great toe. Recommend she continue to use rest, ice, compression, and elevation. Follow up if symptoms continue or get worse.

## 2023-01-21 ENCOUNTER — Encounter: Payer: Self-pay | Admitting: Nurse Practitioner

## 2023-01-21 ENCOUNTER — Ambulatory Visit (INDEPENDENT_AMBULATORY_CARE_PROVIDER_SITE_OTHER): Payer: PPO | Admitting: Nurse Practitioner

## 2023-01-21 VITALS — BP 125/85 | HR 55 | Temp 97.6°F | Resp 20 | Ht 64.0 in | Wt 145.0 lb

## 2023-01-21 DIAGNOSIS — I6930 Unspecified sequelae of cerebral infarction: Secondary | ICD-10-CM

## 2023-01-21 DIAGNOSIS — E7800 Pure hypercholesterolemia, unspecified: Secondary | ICD-10-CM

## 2023-01-21 DIAGNOSIS — L719 Rosacea, unspecified: Secondary | ICD-10-CM

## 2023-01-21 DIAGNOSIS — M21371 Foot drop, right foot: Secondary | ICD-10-CM | POA: Diagnosis not present

## 2023-01-21 DIAGNOSIS — G8111 Spastic hemiplegia affecting right dominant side: Secondary | ICD-10-CM

## 2023-01-21 DIAGNOSIS — F419 Anxiety disorder, unspecified: Secondary | ICD-10-CM | POA: Diagnosis not present

## 2023-01-21 DIAGNOSIS — K219 Gastro-esophageal reflux disease without esophagitis: Secondary | ICD-10-CM | POA: Diagnosis not present

## 2023-01-21 DIAGNOSIS — I10 Essential (primary) hypertension: Secondary | ICD-10-CM | POA: Diagnosis not present

## 2023-01-21 LAB — CBC WITH DIFFERENTIAL/PLATELET
Basophils Absolute: 0 10*3/uL (ref 0.0–0.2)
Basos: 1 %
EOS (ABSOLUTE): 0.2 10*3/uL (ref 0.0–0.4)
Eos: 4 %
Hematocrit: 41.5 % (ref 34.0–46.6)
Hemoglobin: 13.9 g/dL (ref 11.1–15.9)
Immature Grans (Abs): 0 10*3/uL (ref 0.0–0.1)
Immature Granulocytes: 0 %
Lymphocytes Absolute: 1.6 10*3/uL (ref 0.7–3.1)
Lymphs: 35 %
MCH: 32.6 pg (ref 26.6–33.0)
MCHC: 33.5 g/dL (ref 31.5–35.7)
MCV: 97 fL (ref 79–97)
Monocytes Absolute: 0.4 10*3/uL (ref 0.1–0.9)
Monocytes: 10 %
Neutrophils Absolute: 2.4 10*3/uL (ref 1.4–7.0)
Neutrophils: 50 %
Platelets: 207 10*3/uL (ref 150–450)
RBC: 4.27 x10E6/uL (ref 3.77–5.28)
RDW: 11.6 % — ABNORMAL LOW (ref 11.7–15.4)
WBC: 4.6 10*3/uL (ref 3.4–10.8)

## 2023-01-21 LAB — CMP14+EGFR
ALT: 12 IU/L (ref 0–32)
AST: 25 IU/L (ref 0–40)
Albumin: 4.3 g/dL (ref 3.8–4.9)
Alkaline Phosphatase: 100 IU/L (ref 44–121)
BUN/Creatinine Ratio: 16 (ref 9–23)
BUN: 13 mg/dL (ref 6–24)
Bilirubin Total: 0.4 mg/dL (ref 0.0–1.2)
CO2: 25 mmol/L (ref 20–29)
Calcium: 8.9 mg/dL (ref 8.7–10.2)
Chloride: 104 mmol/L (ref 96–106)
Creatinine, Ser: 0.82 mg/dL (ref 0.57–1.00)
Globulin, Total: 2.3 g/dL (ref 1.5–4.5)
Glucose: 69 mg/dL — ABNORMAL LOW (ref 70–99)
Potassium: 4.3 mmol/L (ref 3.5–5.2)
Sodium: 140 mmol/L (ref 134–144)
Total Protein: 6.6 g/dL (ref 6.0–8.5)
eGFR: 86 mL/min/{1.73_m2} (ref >59)

## 2023-01-21 LAB — LIPID PANEL
Chol/HDL Ratio: 2.5 ratio (ref 0.0–4.4)
Cholesterol, Total: 157 mg/dL (ref 100–199)
HDL: 62 mg/dL (ref >39)
LDL Chol Calc (NIH): 69 mg/dL (ref 0–99)
Triglycerides: 153 mg/dL — ABNORMAL HIGH (ref 0–149)
VLDL Cholesterol Cal: 26 mg/dL (ref 5–40)

## 2023-01-21 MED ORDER — BACLOFEN 10 MG PO TABS: ORAL_TABLET | ORAL | 1 refills | Status: DC

## 2023-01-21 MED ORDER — PANTOPRAZOLE SODIUM 40 MG PO TBEC: 40.0000 mg | DELAYED_RELEASE_TABLET | Freq: Every day | ORAL | 1 refills | Status: DC

## 2023-01-21 MED ORDER — LISINOPRIL 20 MG PO TABS
20.0000 mg | ORAL_TABLET | Freq: Every day | ORAL | 1 refills | Status: DC
Start: 2023-01-21 — End: 2023-05-11

## 2023-01-21 MED ORDER — ATORVASTATIN CALCIUM 20 MG PO TABS
20.0000 mg | ORAL_TABLET | Freq: Every day | ORAL | 1 refills | Status: DC
Start: 2023-01-21 — End: 2023-07-23

## 2023-01-21 NOTE — Progress Notes (Signed)
Subjective:    Patient ID: Cheryl Reeves, female    DOB: [birth year], 52 y.o.   MRN: [id number]   Chief Complaint: medical management of chronic issues     HPI:  Cheryl Reeves is a 52 y.o. who identifies as a female who was assigned female at birth.   Social history: Lives with: husband Work history: disabiity   Comes in today for follow up of the following chronic medical issues:  1. Primary hypertension No c/o chest pain, sob or headache. Does not check blood pressure at home. BP Readings from Last 3 Encounters:  12/14/22 131/88  08/13/22 (!) 145/90  07/23/22 126/84     2. Pure hypercholesterolemia Does not watch diet and  does no exercise. Lab Results  Component Value Date   CHOL 145 07/23/2022   HDL 45 07/23/2022   LDLCALC 83 07/23/2022   TRIG 87 07/23/2022   CHOLHDL 3.2 07/23/2022     3. Gastroesophageal reflux disease, unspecified whether esophagitis present IS  on protnix and is doing well.  4. Spastic hemiparesis of right dominant side (HCC) 5. Late effect of cerebrovascular accident (CVA) 6. Foot drop, right Right side hemiparesis and foot drop secondary to stroke. Is doing well. Walks with a cane. Has frequent falls. Has not had any falls since last visit  7. Anxiety Stays anxious since her dad passed away.     2023-01-25    8:40 AM 12/14/2022   10:08 AM 08/13/2022   10:38 AM 07/23/2022    8:29 AM  GAD 7 : Generalized Anxiety Score  Nervous, Anxious, on Edge 0 0 0 1  Control/stop worrying 0 0 0 0  Worry too much - different things 0 0 0 0  Trouble relaxing 0 1 0 0  Restless 0 1 0 0  Easily annoyed or irritable 0 1 0 1  Afraid - awful might happen 0 0 0 0  Total GAD 7 Score 0 3 0 2  Anxiety Difficulty Not difficult at all Not difficult at all Not difficult at all Not difficult at all      8. Rosacea, acne Sees dermatology. No issues currently   New complaints: None today  Allergies  Allergen Reactions   Codeine      headaches   Outpatient Encounter Medications as of Jan 25, 2023  Medication Sig   aspirin 81 MG tablet Take 81 mg by mouth daily.   atorvastatin (LIPITOR) 20 MG tablet Take 1 tablet (20 mg total) by mouth daily.   baclofen (LIORESAL) 10 MG tablet TAKE ONE (1) TABLET BY MOUTH TWO (2) TIMES DAILY   celecoxib (CELEBREX) 200 MG capsule Take 1 capsule (200 mg total) by mouth 2 (two) times daily.   fluticasone (FLONASE) 50 MCG/ACT nasal spray Place 2 sprays into both nostrils daily.   hydrOXYzine (VISTARIL) 25 MG capsule TAKE 1 CAPSULE BY MOUTH TWICE DAILY AS NEEDED   ibuprofen (ADVIL) 800 MG tablet TAKE 1 TABLET BY MOUTH EVERY 8 HOURS AS NEEDED FOR PAIN   lisinopril (ZESTRIL) 20 MG tablet Take 1 tablet (20 mg total) by mouth daily.   ondansetron (ZOFRAN-ODT) 4 MG disintegrating tablet TAKE 1 TABLET BY MOUTH EVERY 8 HOURS AS NEEDED FOR NAUSEA & VOMITING   pantoprazole (PROTONIX) 40 MG tablet Take 1 tablet (40 mg total) by mouth daily.   zolpidem (AMBIEN) 5 MG tablet Take 1 tablet (5 mg total) by mouth at bedtime as needed for sleep.   zonisamide (ZONEGRAN) 100 MG capsule TAKE 2 CAPSULES  BY MOUTH AT BEDTIME   Facility-Administered Encounter Medications as of 01/21/2023  Medication   incobotulinumtoxinA (XEOMIN) 100 units injection 300 Units   incobotulinumtoxinA (XEOMIN) 100 units injection 300 Units    Past Surgical History:  Procedure Laterality Date   ARTHROSCOPY KNEE W/ DRILLING  2008   rt   REPLACEMENT TOTAL KNEE Right 2009   SEPTOPLASTY Bilateral 07/21/2013   Procedure: SEPTOPLASTY;  Surgeon: Suzanna Obey, MD;  Location: [facility];  Service: ENT;  Laterality: Bilateral;   SINUS ENDO W/FUSION Bilateral 07/21/2013   Procedure: ENDOSCOPIC SINUS SURGERY WITH FUSION NAVIGATION;  Surgeon: Suzanna Obey, MD;  Location: [facility];  Service: ENT;  Laterality: Bilateral;   TUBAL LIGATION  2000    Family History  Problem Relation Age of Onset   Cancer Mother        lung    Arthritis Mother    Heart disease Father    Cancer Father        lung    Hypertension Father    Heart disease Brother    Hypertension Brother    Diabetes Other    Arthritis/Rheumatoid Other    Breast cancer Neg Hx       Controlled substance contract: 01/27/19     Review of Systems  Constitutional:  Negative for diaphoresis.  Eyes:  Negative for pain.  Respiratory:  Negative for shortness of breath.   Cardiovascular:  Negative for chest pain, palpitations and leg swelling.  Gastrointestinal:  Negative for abdominal pain.  Endocrine: Negative for polydipsia.  Skin:  Negative for rash.  Neurological:  Negative for dizziness, weakness and headaches.  Hematological:  Does not bruise/bleed easily.  All other systems reviewed and are negative.      Objective:   Physical Exam Vitals and nursing note reviewed.  Constitutional:      General: She is not in acute distress.    Appearance: Normal appearance. She is well-developed.  HENT:     Head: Normocephalic.     Right Ear: Tympanic membrane normal.     Left Ear: Tympanic membrane normal.     Nose: Nose normal.     Mouth/Throat:     Mouth: Mucous membranes are moist.  Eyes:     Pupils: Pupils are equal, round, and reactive to light.  Neck:     Vascular: No carotid bruit or JVD.  Cardiovascular:     Rate and Rhythm: Normal rate and regular rhythm.     Heart sounds: Normal heart sounds.  Pulmonary:     Effort: Pulmonary effort is normal. No respiratory distress.     Breath sounds: Normal breath sounds. No wheezing or rales.  Chest:     Chest wall: No tenderness.  Abdominal:     General: Bowel sounds are normal. There is no distension or abdominal bruit.     Palpations: Abdomen is soft. There is no hepatomegaly, splenomegaly, mass or pulsatile mass.     Tenderness: There is no abdominal tenderness.  Musculoskeletal:        General: Normal range of motion.     Cervical back: Normal range of motion and neck supple.   Lymphadenopathy:     Cervical: No cervical adenopathy.  Skin:    General: Skin is warm and dry.  Neurological:     Mental Status: She is alert and oriented to person, place, and time.     Deep Tendon Reflexes: Reflexes are normal and symmetric.  Psychiatric:  Behavior: Behavior normal.        Thought Content: Thought content normal.        Judgment: Judgment normal.     BP 125/85   Pulse (!) 55   Temp 97.6 F (36.4 C) (Temporal)   Resp 20   Ht 5\' 4"  (1.626 m)   Wt 145 lb (65.8 kg)   SpO2 97%   BMI 24.89 kg/m        Assessment & Plan:   Sharaine Teresa Reeves comes in today with chief complaint of Medical Management of Chronic Issues   Diagnosis and orders addressed:  1. Primary hypertension Low sodium diet - lisinopril (ZESTRIL) 20 MG tablet; Take 1 tablet (20 mg total) by mouth daily.  Dispense: 90 tablet; Refill: 1 - CBC with Differential/Platelet - CMP14+EGFR  2. Pure hypercholesterolemia Low fat diet - atorvastatin (LIPITOR) 20 MG tablet; Take 1 tablet (20 mg total) by mouth daily.  Dispense: 90 tablet; Refill: 1 - Lipid panel  3. Gastroesophageal reflux disease, unspecified whether esophagitis present Avoid spicy foods Do not eat 2 hours prior to bedtime - pantoprazole (PROTONIX) 40 MG tablet; Take 1 tablet (40 mg total) by mouth daily.  Dispense: 90 tablet; Refill: 1  4. Spastic hemiparesis of right dominant side (HCC) Fall prevention  5. Late effect of cerebrovascular accident (CVA) - baclofen (LIORESAL) 10 MG tablet; TAKE ONE (1) TABLET BY MOUTH TWO (2) TIMES DAILY  Dispense: 180 each; Refill: 1  6. Foot drop, right Continue to use cane when walking  7. Anxiety Stress management  8. Rosacea, acne    Labs pending Health Maintenance reviewed Diet and exercise encouraged  Follow up plan: 6 months   [name], FNP

## 2023-01-21 NOTE — Patient Instructions (Signed)
Fall Prevention in the Home, Adult Falls can cause injuries and can happen to people of all ages. There are many things you can do to make your home safer and to help prevent falls. What actions can I take to prevent falls? General information Use good lighting in all rooms. Make sure to: Replace any light bulbs that burn out. Turn on the lights in dark areas and use night-lights. Keep items that you use often in easy-to-reach places. Lower the shelves around your home if needed. Move furniture so that there are clear paths around it. Do not use throw rugs or other things on the floor that can make you trip. If any of your floors are uneven, fix them. Add color or contrast paint or tape to clearly mark and help you see: Grab bars or handrails. First and last steps of staircases. Where the edge of each step is. If you use a ladder or stepladder: Make sure that it is fully opened. Do not climb a closed ladder. Make sure the sides of the ladder are locked in place. Have someone hold the ladder while you use it. Know where your pets are as you move through your home. What can I do in the bathroom?     Keep the floor dry. Clean up any water on the floor right away. Remove soap buildup in the bathtub or shower. Buildup makes bathtubs and showers slippery. Use non-skid mats or decals on the floor of the bathtub or shower. Attach bath mats securely with double-sided, non-slip rug tape. If you need to sit down in the shower, use a non-slip stool. Install grab bars by the toilet and in the bathtub and shower. Do not use towel bars as grab bars. What can I do in the bedroom? Make sure that you have a light by your bed that is easy to reach. Do not use any sheets or blankets on your bed that hang to the floor. Have a firm chair or bench with side arms that you can use for support when you get dressed. What can I do in the kitchen? Clean up any spills right away. If you need to reach something  above you, use a step stool with a grab bar. Keep electrical cords out of the way. Do not use floor polish or wax that makes floors slippery. What can I do with my stairs? Do not leave anything on the stairs. Make sure that you have a light switch at the top and the bottom of the stairs. Make sure that there are handrails on both sides of the stairs. Fix handrails that are broken or loose. Install non-slip stair treads on all your stairs if they do not have carpet. Avoid having throw rugs at the top or bottom of the stairs. Choose a carpet that does not hide the edge of the steps on the stairs. Make sure that the carpet is firmly attached to the stairs. Fix carpet that is loose or worn. What can I do on the outside of my home? Use bright outdoor lighting. Fix the edges of walkways and driveways and fix any cracks. Clear paths of anything that can make you trip, such as tools or rocks. Add color or contrast paint or tape to clearly mark and help you see anything that might make you trip as you walk through a door, such as a raised step or threshold. Trim any bushes or trees on paths to your home. Check to see if handrails are loose   or broken and that both sides of all steps have handrails. Install guardrails along the edges of any raised decks and porches. Have leaves, snow, or ice cleared regularly. Use sand, salt, or ice melter on paths if you live where there is ice and snow during the winter. Clean up any spills in your garage right away. This includes grease or oil spills. What other actions can I take? Review your medicines with your doctor. Some medicines can cause dizziness or changes in blood pressure, which increase your risk of falling. Wear shoes that: Have a low heel. Do not wear high heels. Have rubber bottoms and are closed at the toe. Feel good on your feet and fit well. Use tools that help you move around if needed. These include: Canes. Walkers. Scooters. Crutches. Ask  your doctor what else you can do to help prevent falls. This may include seeing a physical therapist to learn to do exercises to move better and get stronger. Where to find more information Centers for Disease Control and Prevention, [employer]: cdc.gov National Institute on Aging: nia.nih.gov National Institute on Aging: nia.nih.gov Contact a doctor if: You are afraid of falling at home. You feel weak, drowsy, or dizzy at home. You fall at home. Get help right away if you: Lose consciousness or have trouble moving after a fall. Have a fall that causes a head injury. These symptoms may be an emergency. Get help right away. Call 911. Do not wait to see if the symptoms will go away. Do not drive yourself to the hospital. This information is not intended to replace advice given to you by your health care provider. Make sure you discuss any questions you have with your health care provider. Document Revised: 01/26/2022 Document Reviewed: 01/26/2022 Elsevier Patient Education  2024 Elsevier Inc.  

## 2023-01-27 ENCOUNTER — Other Ambulatory Visit: Payer: Self-pay | Admitting: Nurse Practitioner

## 2023-01-27 DIAGNOSIS — Z1231 Encounter for screening mammogram for malignant neoplasm of breast: Secondary | ICD-10-CM

## 2023-02-03 ENCOUNTER — Ambulatory Visit
Admission: RE | Admit: 2023-02-03 | Discharge: 2023-02-03 | Disposition: A | Discharge status: Home / Self Care | Payer: PPO | Source: Ambulatory Visit | Attending: Nurse Practitioner | Admitting: Nurse Practitioner

## 2023-02-03 DIAGNOSIS — Z1231 Encounter for screening mammogram for malignant neoplasm of breast: Secondary | ICD-10-CM

## 2023-02-09 ENCOUNTER — Other Ambulatory Visit: Payer: Self-pay | Admitting: Nurse Practitioner

## 2023-03-05 ENCOUNTER — Ambulatory Visit (INDEPENDENT_AMBULATORY_CARE_PROVIDER_SITE_OTHER): Payer: PPO

## 2023-03-05 ENCOUNTER — Ambulatory Visit: Payer: PPO | Attending: Internal Medicine | Admitting: Internal Medicine

## 2023-03-05 ENCOUNTER — Ambulatory Visit: Payer: PPO

## 2023-03-05 ENCOUNTER — Encounter: Payer: Self-pay | Admitting: Internal Medicine

## 2023-03-05 VITALS — BP 107/74 | HR 76 | Resp 16 | Ht 65.0 in | Wt 147.0 lb

## 2023-03-05 DIAGNOSIS — R768 Other specified abnormal immunological findings in serum: Secondary | ICD-10-CM

## 2023-03-05 DIAGNOSIS — M79641 Pain in right hand: Secondary | ICD-10-CM

## 2023-03-05 DIAGNOSIS — G5602 Carpal tunnel syndrome, left upper limb: Secondary | ICD-10-CM

## 2023-03-05 DIAGNOSIS — G8111 Spastic hemiplegia affecting right dominant side: Secondary | ICD-10-CM | POA: Diagnosis not present

## 2023-03-05 DIAGNOSIS — M79642 Pain in left hand: Secondary | ICD-10-CM | POA: Diagnosis not present

## 2023-03-09 LAB — C3 AND C4
C3 Complement: 115 mg/dL (ref 83–193)
C4 Complement: 22 mg/dL (ref 15–57)

## 2023-03-09 LAB — C-REACTIVE PROTEIN: CRP: 4.8 mg/L (ref <8.0)

## 2023-03-09 LAB — CYCLIC CITRUL PEPTIDE ANTIBODY, IGG: Cyclic Citrullin Peptide Ab: <16 U

## 2023-03-09 LAB — SEDIMENTATION RATE: Sed Rate: 6 mm/h (ref 0–30)

## 2023-04-12 ENCOUNTER — Ambulatory Visit: Payer: PPO

## 2023-04-12 ENCOUNTER — Ambulatory Visit: Payer: PPO | Attending: Internal Medicine | Admitting: Internal Medicine

## 2023-04-12 ENCOUNTER — Encounter: Payer: Self-pay | Admitting: Internal Medicine

## 2023-04-12 VITALS — BP 138/79 | HR 73 | Resp 14 | Ht 65.0 in | Wt 146.0 lb

## 2023-04-12 DIAGNOSIS — G5602 Carpal tunnel syndrome, left upper limb: Secondary | ICD-10-CM | POA: Diagnosis not present

## 2023-04-12 DIAGNOSIS — R768 Other specified abnormal immunological findings in serum: Secondary | ICD-10-CM

## 2023-04-12 DIAGNOSIS — G8111 Spastic hemiplegia affecting right dominant side: Secondary | ICD-10-CM

## 2023-04-12 DIAGNOSIS — S22000S Wedge compression fracture of unspecified thoracic vertebra, sequela: Secondary | ICD-10-CM

## 2023-04-12 NOTE — Progress Notes (Signed)
Office Visit Note  Patient: Cheryl Reeves             Date of Birth: 1971-05-10           MRN: [id number]             PCP: Bennie Pierini, FNP Referring: Bennie Pierini, * Visit Date: 04/12/2023   Subjective:  Follow-up   History of Present Illness: Cheryl Reeves is a 52 y.o. female here for follow up for ongoing joint pain in her hands and back and positive rheumatoid factor.  Workup at initial visit was negative for serum inflammatory markers and for CCP antibody.  X-ray of the hands was consistent with osteoarthritis involving the MCP joints on her left hand.  She has tried doing the range of motion exercises and wearing a rigid wrist brace at night and sometimes in the day without any resolution of symptoms.  Still wakes her up from sleep frequently with hand pain and numbness.   Previous HPI 03/05/23 Cheryl Reeves is a 52 y.o. female here for evaluation of joint pain in multiple areas especially in her hands and back and with positive rheumatoid factor.  She has somewhat longstanding back pain although this has been worse especially in the past year.  She had a previous stroke with residual spastic hemiparesis of the right side and since then feels she keeps a degree of muscle pain and soreness.  She had right knee replacement in 2009.  She has had chronic pain and stiffness in her hand performed with study for left wrist carpal tunnel syndrome in 2018 showing moderately severe disease.  She tried using a wrist brace with limited benefit also did not find 1 that fit well without causing pressure areas on her hand.  In the past year hand pain is doing worse especially on the left side which she does use much more heavily due to residual right-sided deficit.  She notices swelling at sometimes but not on others not associated with specific time of day position or activity.  She feels like there are nodules or more prominent bumps on the palmar side under her MCP  joint.  Does not notice an increase in foot symptoms she does use ankle-foot orthotic for right sided foot drop.  Currently prescribed Celebrex 200 mg twice daily and takes baclofen 10 mg twice daily for spastic paresis.  Also treated with Xeomin injections and takes ibuprofen as needed. Besides joint pain she has numerous widespread symptoms including chronic fatigue, dryness of eyes and mouth, intermittent painful mouth sores, alternating constipation and diarrhea, and frequent dizziness and headaches. Does not report cervical axillary lymphadenopathy, Raynaud's symptoms, abnormal bruising or bleeding. Mother had rheumatoid arthritis with deforming arthritis of both hands.  She has some concern regarding RA treatments due to her mother developing lung cancer on long-term methotrexate treatment though she was also a long-term cigarette smoker.     Labs reviewed 07/2022 RF 16.4 ESR 2   Labs reviewed 03/2021 RF 23.3   Review of Systems  Constitutional:  Positive for fatigue.  HENT:  Positive for mouth sores and mouth dryness.   Eyes:  Positive for dryness.  Respiratory:  Positive for shortness of breath.   Cardiovascular:  Negative for chest pain and palpitations.  Gastrointestinal:  Negative for blood in stool, constipation and diarrhea.  Endocrine: Negative for increased urination.  Genitourinary:  Positive for involuntary urination.  Musculoskeletal:  Positive for joint pain, gait problem, joint pain, joint swelling, myalgias,  muscle weakness, morning stiffness and myalgias. Negative for muscle tenderness.  Skin:  Positive for rash and sensitivity to sunlight. Negative for color change and hair loss.  Allergic/Immunologic: Positive for susceptible to infections.  Neurological:  Positive for headaches. Negative for dizziness.  Hematological:  Negative for swollen glands.  Psychiatric/Behavioral:  Positive for sleep disturbance. Negative for depressed mood. The patient is  nervous/anxious.     PMFS History:  Patient Active Problem List   Diagnosis Date Noted   Rheumatoid factor positive 03/05/2023   Anxiety 07/23/2022   Primary hypertension 07/23/2022   Pure hypercholesterolemia 07/23/2022   Spastic hemiparesis of right dominant side (HCC) 09/08/2021   Late effect of cerebrovascular accident (CVA) 09/08/2021   Chronic migraine w/o aura w/o status migrainosus, not intractable 01/27/2021   History of multiple allergies 01/31/2018   Compression fracture of thoracic vertebra (HCC) 09/27/2017   Carpal tunnel syndrome of left wrist 10/16/2016   Allergic rhinitis 10/09/2013   GERD (gastroesophageal reflux disease) 10/09/2013   Foot drop, right 10/09/2013   Rosacea, acne 10/09/2013   Seizures (HCC) 10/09/2013    Past Medical History:  Diagnosis Date   Allergy    DJD (degenerative joint disease)    GERD (gastroesophageal reflux disease)    Headache    Stroke (HCC) 1999   S/p childbirth-weakness rt leg   Thoracic injuries    Wears glasses     Family History  Problem Relation Age of Onset   Cancer Mother        lung   Arthritis Mother    Heart disease Father    Cancer Father        lung    Hypertension Father    Heart disease Brother    Hypertension Brother    Diabetes Other    Arthritis/Rheumatoid Other    Breast cancer Neg Hx    Past Surgical History:  Procedure Laterality Date   ARTHROSCOPY KNEE W/ DRILLING  2008   rt   REPLACEMENT TOTAL KNEE Right 2009   SEPTOPLASTY Bilateral 07/21/2013   Procedure: SEPTOPLASTY;  Surgeon: Suzanna Obey, MD;  Location: [facility];  Service: ENT;  Laterality: Bilateral;   SINUS ENDO W/FUSION Bilateral 07/21/2013   Procedure: ENDOSCOPIC SINUS SURGERY WITH FUSION NAVIGATION;  Surgeon: Suzanna Obey, MD;  Location: [facility];  Service: ENT;  Laterality: Bilateral;   TUBAL LIGATION  2000   Social History   Social History Narrative   Lives at home with son   Married, 1 son and 1  stepson , 1 daughter    Right handed   10 th grade   3-4 cups caffeine per day.   Immunization History  Administered Date(s) Administered   Influenza Split 03/15/2013   Tdap 09/01/2017     Objective: Vital Signs: BP 138/79 (BP Location: Left Arm, Patient Position: Sitting, Cuff Size: Normal)   Pulse 73   Resp 14   Ht 5\' 5"  (1.651 m)   Wt 146 lb (66.2 kg)   BMI 24.30 kg/m    Physical Exam Eyes:     Conjunctiva/sclera: Conjunctivae normal.  Cardiovascular:     Rate and Rhythm: Normal rate and regular rhythm.  Pulmonary:     Effort: Pulmonary effort is normal.     Breath sounds: Normal breath sounds.  Lymphadenopathy:     Cervical: No cervical adenopathy.  Skin:    General: Skin is warm and dry.     Findings: No rash.  Neurological:     Mental  Status: She is alert.  Psychiatric:        Mood and Affect: Mood normal.      Musculoskeletal Exam:  Neck stiffness with lateral rotation to both sides Elbows full ROM no tenderness or swelling Wrists full ROM no swelling, left wrist tenderness to percussion radiating into hand Fingers full ROM, no palpable swelling Midline and bilateral paraspinal muscle tenderness in her back worse near the base of the neck and at lumbar spine, no radiating symptoms Right knee chronic postsurgical changes, restricted flexion and extension with soft endpoint, no palpable swelling, left knee full range of motion Right ankle in AFO    Investigation: No additional findings.  Imaging: US Guided Needle Placement  Result Date: 04/29/2023 Left wrist carpal tunnel injection Ultrasound guided injection is preferred based studies that show increased duration, increased effect, greater accuracy, decreased procedural pain, increased response rate, and decreased cost with ultrasound guided versus blind injection. Verbal informed consent obtained.  Time-out conducted.  Noted no overlying erythema, induration, or other signs of local infection.  Ultrasound-guided left carpal tunnel injection. After sterile prep with Betadine, injected 1 mL 1% lidocaine and 40 mg kenalog using a 27g needle by radial approach. Image 1-3 shows advancement of needle next to median nerve. Image 4-7 shows injection of medication into carpal tunnel space. Video shows advancement of needle to position deep to median nerve. Second image series 1-5 showing injection of remaining medication volume.    Recent Labs: Lab Results  Component Value Date   WBC 4.6 01/21/2023   HGB 13.9 01/21/2023   PLT 207 01/21/2023   NA 140 01/21/2023   K 4.3 01/21/2023   CL 104 01/21/2023   CO2 25 01/21/2023   GLUCOSE 69 (L) 01/21/2023   BUN 13 01/21/2023   CREATININE 0.82 01/21/2023   BILITOT 0.4 01/21/2023   ALKPHOS 100 01/21/2023   AST 25 01/21/2023   ALT 12 01/21/2023   PROT 6.6 01/21/2023   ALBUMIN 4.3 01/21/2023   CALCIUM 8.9 01/21/2023   GFRAA 92 06/17/2020    Speciality Comments: No specialty comments available.  Procedures:  Hand/UE Inj: L carpal tunnel for carpal tunnel syndrome on 04/12/2023 1:30 PM Indications: pain and therapeutic Details: 27 G needle, ultrasound-guided radial approach Medications: 1 mL lidocaine 1 %; 40 mg triamcinolone acetonide 40 MG/ML Outcome: tolerated well, no immediate complications Procedure, treatment alternatives, risks and benefits explained, specific risks discussed. Consent was given by the patient. Immediately prior to procedure a time out was called to verify the correct patient, procedure, equipment, support staff and site/side marked as required. Patient was prepped and draped in the usual sterile fashion.     Allergies: Codeine   Assessment / Plan:     Visit Diagnoses: Carpal tunnel syndrome of left wrist - Plan: US Guided Needle Placement  Left wrist carpal tunnel syndrome with pain also with numbness and functional difficulty has not improved with conservative treatment including NSAIDs, exercises, and wrist brace  for about 6 weeks since the last visit.  It reported lack of response to wrist brace in the past also.  Treatment with ultrasound-guided steroid injection today.  Compression fracture of thoracic vertebra, unspecified thoracic vertebral level, sequela  Symptoms exam and findings not suggestive for axial joint inflammation.  I think his back pain is not being controlled with oral anti-inflammatory medicine could benefit to establish with orthopedic or pain management spine specialist.  Spastic hemiparesis of right dominant side (HCC)  More advanced symptoms of left upper extremity I  believe this is usually with dependence on the side and also ambulatory with cane loading this hand and wrist more heavily.  Rheumatoid factor positive  With negative systemic inflammatory markers and no synovitis appreciable on exam again today clinically does not present with RA.  I discussed possibility of developing inflammation lagging after seropositivity although this is a minority case.  For now agree with continuing NSAIDs she is already taking we can monitor again when following up after the injection.  Orders: Orders Placed This Encounter  Procedures   Hand/UE Inj: L carpal tunnel   US Guided Needle Placement   No orders of the defined types were placed in this encounter.    Follow-Up Instructions: Return in about 3 months (around 07/13/2023) for CTS left wrist inj f/u 3mos.   Fuller Plan, MD  Note - This record has been created using [employer].  Chart creation errors have been sought, but may not always  have been located. Such creation errors do not reflect on  the standard of medical care.

## 2023-04-19 ENCOUNTER — Other Ambulatory Visit: Payer: Self-pay | Admitting: Neurology

## 2023-04-21 ENCOUNTER — Other Ambulatory Visit: Payer: Self-pay | Admitting: Neurology

## 2023-04-21 MED ORDER — ZONISAMIDE 100 MG PO CAPS: 200.0000 mg | ORAL_CAPSULE | Freq: Every day | ORAL | 11 refills | Status: DC

## 2023-04-21 NOTE — Telephone Encounter (Signed)
Pt has scheduled a 1 yr f/u and is on wait list, pt is asking if since she has an appointment now can she get a refill on her zonisamide (ZONEGRAN) 100 MG capsule, she only has 3 pills.

## 2023-04-21 NOTE — Telephone Encounter (Signed)
Requested Prescriptions   Pending Prescriptions Disp Refills   zonisamide (ZONEGRAN) 100 MG capsule 60 capsule 11    Sig: Take 2 capsules (200 mg total) by mouth at bedtime.   Last seen 12/04/21 Next appt 07/27/23 Dispenses   Dispensed Days Supply Quantity Provider Pharmacy  ZONISAMIDE 100 MG CAPSULE 02/09/2023 30 60 capsule Levert Feinstein, MD [facility]  ZONISAMIDE 100 MG CAPSULE 11/27/2022 30 60 capsule Levert Feinstein, MD [facility]  ZONISAMIDE 100 MG CAPSULE 09/28/2022 30 60 capsule Levert Feinstein, MD [facility]  ZONISAMIDE 100 MG CAPSULE 07/14/2022 30 60 capsule Levert Feinstein, MD [facility]  ZONISAMIDE 100 MG CAPSULE 05/08/2022 30 60 capsule Levert Feinstein, MD [facility].Marland KitchenMarland Kitchen

## 2023-04-29 MED ORDER — TRIAMCINOLONE ACETONIDE 40 MG/ML IJ SUSP
40.0000 mg | INTRAMUSCULAR | Status: AC | PRN
  Administered 2023-04-12: 40 mg

## 2023-04-29 MED ORDER — LIDOCAINE HCL 1 % IJ SOLN
1.0000 mL | INTRAMUSCULAR | Status: AC | PRN
Start: 2023-04-12 — End: 2023-04-12
  Administered 2023-04-12: 1 mL

## 2023-05-11 ENCOUNTER — Other Ambulatory Visit: Payer: Self-pay | Admitting: Nurse Practitioner

## 2023-05-11 DIAGNOSIS — I10 Essential (primary) hypertension: Secondary | ICD-10-CM

## 2023-05-26 ENCOUNTER — Other Ambulatory Visit: Payer: Self-pay | Admitting: Nurse Practitioner

## 2023-05-28 ENCOUNTER — Telehealth (INDEPENDENT_AMBULATORY_CARE_PROVIDER_SITE_OTHER): Payer: PPO | Admitting: Family

## 2023-05-28 ENCOUNTER — Encounter: Payer: Self-pay | Admitting: Family

## 2023-05-28 DIAGNOSIS — J0111 Acute recurrent frontal sinusitis: Secondary | ICD-10-CM | POA: Diagnosis not present

## 2023-05-28 MED ORDER — FLUCONAZOLE 150 MG PO TABS: 150.0000 mg | ORAL_TABLET | ORAL | 0 refills | Status: DC | PRN

## 2023-05-28 MED ORDER — AMOXICILLIN-POT CLAVULANATE 875-125 MG PO TABS
1.0000 | ORAL_TABLET | Freq: Two times a day (BID) | ORAL | 0 refills | Status: DC
Start: 2023-05-28 — End: 2023-07-15

## 2023-05-28 MED ORDER — PREDNISONE 10 MG (21) PO TBPK: ORAL_TABLET | ORAL | 0 refills | Status: DC

## 2023-05-28 NOTE — Progress Notes (Signed)
Virtual Visit Consent   Cheryl Reeves, you are scheduled for a virtual visit with a [facility] provider today. Just as with appointments in the office, your consent must be obtained to participate. Your consent will be active for this visit and any virtual visit you may have with one of our providers in the next 365 days. If you have a MyChart account, a copy of this consent can be sent to you electronically.  As this is a virtual visit, video technology does not allow for your provider to perform a traditional examination. This may limit your provider's ability to fully assess your condition. If your provider identifies any concerns that need to be evaluated in person or the need to arrange testing (such as labs, EKG, etc.), we will make arrangements to do so. Although advances in technology are sophisticated, we cannot ensure that it will always work on either your end or our end. If the connection with a video visit is poor, the visit may have to be switched to a telephone visit. With either a video or telephone visit, we are not always able to ensure that we have a secure connection.  By engaging in this virtual visit, you consent to the provision of healthcare and authorize for your insurance to be billed (if applicable) for the services provided during this visit. Depending on your insurance coverage, you may receive a charge related to this service.  I need to obtain your verbal consent now. Are you willing to proceed with your visit today? Cheryl Reeves has provided verbal consent on 05/28/2023 for a virtual visit (video or telephone). Jannifer Rodney, FNP  Date: 05/28/2023 2:29 PM  Virtual Visit via Video Note   I, Jannifer Rodney, connected with  Cheryl Reeves  ([id number], 11-28-1970) on 05/28/23 at  2:35 PM EST by a video-enabled telemedicine application and verified that I am speaking with the correct person using two identifiers.  Location: Patient: Virtual Visit Location  Patient: Home Provider: Virtual Visit Location Provider: Home Office   I discussed the limitations of evaluation and management by telemedicine and the availability of in person appointments. The patient expressed understanding and agreed to proceed.    History of Present Illness: Cheryl Reeves is a 52 y.o. who identifies as a female who was assigned female at birth, and is being seen today for sinus congestion and pain.  HPI: Sinusitis This is a new problem. The current episode started 1 to 4 weeks ago. The problem has been gradually worsening since onset. There has been no fever. Her pain is at a severity of 6/10. The pain is moderate. Associated symptoms include congestion, headaches, a hoarse voice, sinus pressure and sneezing. Pertinent negatives include no coughing, ear pain or sore throat. Past treatments include lying down, acetaminophen, oral decongestants, saline nose sprays and nasal decongestants. The treatment provided mild relief.    Problems:  Patient Active Problem List   Diagnosis Date Noted   Rheumatoid factor positive 03/05/2023   Anxiety 07/23/2022   Primary hypertension 07/23/2022   Pure hypercholesterolemia 07/23/2022   Spastic hemiparesis of right dominant side (HCC) 09/08/2021   Late effect of cerebrovascular accident (CVA) 09/08/2021   Chronic migraine w/o aura w/o status migrainosus, not intractable 01/27/2021   History of multiple allergies 01/31/2018   Compression fracture of thoracic vertebra (HCC) 09/27/2017   Carpal tunnel syndrome of left wrist 10/16/2016   Allergic rhinitis 10/09/2013   GERD (gastroesophageal reflux disease) 10/09/2013   Foot drop, right  10/09/2013   Rosacea, acne 10/09/2013   Seizures (HCC) 10/09/2013    Allergies:  Allergies  Allergen Reactions   Codeine     headaches   Medications:  Current Outpatient Medications:    amoxicillin-clavulanate (AUGMENTIN) 875-125 MG tablet, Take 1 tablet by mouth 2 (two) times daily., Disp:  14 tablet, Rfl: 0   fluconazole (DIFLUCAN) 150 MG tablet, Take 1 tablet (150 mg total) by mouth every three (3) days as needed., Disp: 2 tablet, Rfl: 0   predniSONE (STERAPRED UNI-PAK 21 TAB) 10 MG (21) TBPK tablet, Use as directed, Disp: 21 tablet, Rfl: 0   aspirin 81 MG tablet, Take 81 mg by mouth daily., Disp: , Rfl:    atorvastatin (LIPITOR) 20 MG tablet, Take 1 tablet (20 mg total) by mouth daily., Disp: 90 tablet, Rfl: 1   baclofen (LIORESAL) 10 MG tablet, TAKE ONE (1) TABLET BY MOUTH TWO (2) TIMES DAILY, Disp: 180 each, Rfl: 1   celecoxib (CELEBREX) 200 MG capsule, TAKE ONE (1) CAPSULE BY MOUTH 2 TIMES DAILY, Disp: 60 capsule, Rfl: 0   Cyanocobalamin (VITAMIN B 12 PO), Take by mouth., Disp: , Rfl:    fluticasone (FLONASE) 50 MCG/ACT nasal spray, Place 2 sprays into both nostrils daily., Disp: 16 g, Rfl: 6   hydrOXYzine (VISTARIL) 25 MG capsule, TAKE 1 CAPSULE BY MOUTH TWICE DAILY AS NEEDED, Disp: 180 capsule, Rfl: 0   ibuprofen (ADVIL) 800 MG tablet, TAKE 1 TABLET BY MOUTH EVERY 8 HOURS AS NEEDED FOR PAIN, Disp: 21 tablet, Rfl: 3   lisinopril (ZESTRIL) 20 MG tablet, TAKE ONE (1) TABLET BY MOUTH EVERY DAY, Disp: 90 tablet, Rfl: 0   Multiple Vitamins-Minerals (MULTIVITAMIN WITH MINERALS) tablet, Take 1 tablet by mouth daily., Disp: , Rfl:    ondansetron (ZOFRAN-ODT) 4 MG disintegrating tablet, TAKE 1 TABLET BY MOUTH EVERY 8 HOURS AS NEEDED FOR NAUSEA & VOMITING, Disp: 20 tablet, Rfl: 6   pantoprazole (PROTONIX) 40 MG tablet, Take 1 tablet (40 mg total) by mouth daily., Disp: 90 tablet, Rfl: 1   zonisamide (ZONEGRAN) 100 MG capsule, Take 2 capsules (200 mg total) by mouth at bedtime., Disp: 60 capsule, Rfl: 11  Current Facility-Administered Medications:    incobotulinumtoxinA (XEOMIN) 100 units injection 300 Units, 300 Units, Intramuscular, Q90 days, Levert Feinstein, MD, 300 Units at 09/08/21 1634   incobotulinumtoxinA (XEOMIN) 100 units injection 300 Units, 300 Units, Intramuscular, Q90 days, Levert Feinstein, MD, 300 Units at 12/05/21 1005  Observations/Objective: Patient is well-developed, well-nourished in no acute distress.  Resting comfortably at home.  Head is normocephalic, atraumatic.  No labored breathing.  Speech is clear and coherent with logical content.  Patient is alert and oriented at baseline.  Congestion and sinus pressure   Assessment and Plan: 1. Acute recurrent frontal sinusitis (Primary) - predniSONE (STERAPRED UNI-PAK 21 TAB) 10 MG (21) TBPK tablet; Use as directed  Dispense: 21 tablet; Refill: 0 - amoxicillin-clavulanate (AUGMENTIN) 875-125 MG tablet; Take 1 tablet by mouth 2 (two) times daily.  Dispense: 14 tablet; Refill: 0 - fluconazole (DIFLUCAN) 150 MG tablet; Take 1 tablet (150 mg total) by mouth every three (3) days as needed.  Dispense: 2 tablet; Refill: 0  - Take meds as prescribed - Use a cool mist humidifier  -Use saline nose sprays frequently -Force fluids -For any cough or congestion  Use plain Mucinex- regular strength or max strength is fine -For fever or aces or pains- take tylenol or ibuprofen. -Throat lozenges if help -Follow up if symptoms worsen  or do not improve   Follow Up Instructions: I discussed the assessment and treatment plan with the patient. The patient was provided an opportunity to ask questions and all were answered. The patient agreed with the plan and demonstrated an understanding of the instructions.  A copy of instructions were sent to the patient via MyChart unless otherwise noted below.     The patient was advised to call back or seek an in-person evaluation if the symptoms worsen or if the condition fails to improve as anticipated.    Jannifer Rodney, FNP

## 2023-06-23 DIAGNOSIS — F1410 Cocaine abuse, uncomplicated: Secondary | ICD-10-CM | POA: Diagnosis not present

## 2023-06-23 DIAGNOSIS — R569 Unspecified convulsions: Secondary | ICD-10-CM | POA: Diagnosis not present

## 2023-06-23 DIAGNOSIS — R259 Unspecified abnormal involuntary movements: Secondary | ICD-10-CM | POA: Diagnosis not present

## 2023-06-23 DIAGNOSIS — R0902 Hypoxemia: Secondary | ICD-10-CM | POA: Diagnosis not present

## 2023-06-23 DIAGNOSIS — F10129 Alcohol abuse with intoxication, unspecified: Secondary | ICD-10-CM | POA: Diagnosis not present

## 2023-06-23 DIAGNOSIS — R0689 Other abnormalities of breathing: Secondary | ICD-10-CM | POA: Diagnosis not present

## 2023-06-30 NOTE — Progress Notes (Deleted)
 Office Visit Note  Patient: Cheryl Reeves             Date of Birth: 01/08/1971           MRN: [id number]             PCP: Gladis Mustard, FNP Referring: Gladis Mustard, * Visit Date: 07/13/2023   Subjective:  No chief complaint on file.   History of Present Illness: Quetzaly Teresa Paternoster is a 53 y.o. female here for follow up for ongoing joint pain in her hands and back and positive rheumatoid factor.    Previous HPI 04/12/2023 Cecilie Teresa Bogdon is a 53 y.o. female here for follow up for ongoing joint pain in her hands and back and positive rheumatoid factor.  Workup at initial visit was negative for serum inflammatory markers and for CCP antibody.  X-ray of the hands was consistent with osteoarthritis involving the MCP joints on her left hand.  She has tried doing the range of motion exercises and wearing a rigid wrist brace at night and sometimes in the day without any resolution of symptoms.  Still wakes her up from sleep frequently with hand pain and numbness.    Previous HPI 03/05/23 Fany Teresa Kluger is a 53 y.o. female here for evaluation of joint pain in multiple areas especially in her hands and back and with positive rheumatoid factor.  She has somewhat longstanding back pain although this has been worse especially in the past year.  She had a previous stroke with residual spastic hemiparesis of the right side and since then feels she keeps a degree of muscle pain and soreness.  She had right knee replacement in 2009.  She has had chronic pain and stiffness in her hand performed with study for left wrist carpal tunnel syndrome in 2018 showing moderately severe disease.  She tried using a wrist brace with limited benefit also did not find 1 that fit well without causing pressure areas on her hand.  In the past year hand pain is doing worse especially on the left side which she does use much more heavily due to residual right-sided deficit.  She notices swelling at  sometimes but not on others not associated with specific time of day position or activity.  She feels like there are nodules or more prominent bumps on the palmar side under her MCP joint.  Does not notice an increase in foot symptoms she does use ankle-foot orthotic for right sided foot drop.  Currently prescribed Celebrex  200 mg twice daily and takes baclofen  10 mg twice daily for spastic paresis.  Also treated with Xeomin  injections and takes ibuprofen  as needed. Besides joint pain she has numerous widespread symptoms including chronic fatigue, dryness of eyes and mouth, intermittent painful mouth sores, alternating constipation and diarrhea, and frequent dizziness and headaches. Does not report cervical axillary lymphadenopathy, Raynaud's symptoms, abnormal bruising or bleeding. Mother had rheumatoid arthritis with deforming arthritis of both hands.  She has some concern regarding RA treatments due to her mother developing lung cancer on long-term methotrexate treatment though she was also a long-term cigarette smoker.     Labs reviewed 07/2022 RF 16.4 ESR 2   Labs reviewed 03/2021 RF 23.3   No Rheumatology ROS completed.   PMFS History:  Patient Active Problem List   Diagnosis Date Noted   Rheumatoid factor positive 03/05/2023   Anxiety 07/23/2022   Primary hypertension 07/23/2022   Pure hypercholesterolemia 07/23/2022   Spastic hemiparesis of right dominant side (  HCC) 09/08/2021   Late effect of cerebrovascular accident (CVA) 09/08/2021   Chronic migraine w/o aura w/o status migrainosus, not intractable 01/27/2021   History of multiple allergies 01/31/2018   Compression fracture of thoracic vertebra (HCC) 09/27/2017   Carpal tunnel syndrome of left wrist 10/16/2016   Allergic rhinitis 10/09/2013   GERD (gastroesophageal reflux disease) 10/09/2013   Foot drop, right 10/09/2013   Rosacea, acne 10/09/2013   Seizures (HCC) 10/09/2013    Past Medical History:  Diagnosis Date    Allergy    DJD (degenerative joint disease)    GERD (gastroesophageal reflux disease)    Headache    Stroke (HCC) 1999   S/p childbirth-weakness rt leg   Thoracic injuries    Wears glasses     Family History  Problem Relation Age of Onset   Cancer Mother        lung   Arthritis Mother    Heart disease Father    Cancer Father        lung    Hypertension Father    Heart disease Brother    Hypertension Brother    Diabetes Other    Arthritis/Rheumatoid Other    Breast cancer Neg Hx    Past Surgical History:  Procedure Laterality Date   ARTHROSCOPY KNEE W/ DRILLING  2008   rt   REPLACEMENT TOTAL KNEE Right 2009   SEPTOPLASTY Bilateral 07/21/2013   Procedure: SEPTOPLASTY;  Surgeon: Norleen Notice, MD;  Location: [facility];  Service: ENT;  Laterality: Bilateral;   SINUS ENDO W/FUSION Bilateral 07/21/2013   Procedure: ENDOSCOPIC SINUS SURGERY WITH FUSION NAVIGATION;  Surgeon: Norleen Notice, MD;  Location: [facility];  Service: ENT;  Laterality: Bilateral;   TUBAL LIGATION  2000   Social History   Social History Narrative   Lives at home with son   Married, 1 son and 1 stepson , 1 daughter    Right handed   10 th grade   3-4 cups caffeine  per day.   Immunization History  Administered Date(s) Administered   Influenza Split 03/15/2013   Tdap 09/01/2017     Objective: Vital Signs: There were no vitals taken for this visit.   Physical Exam   Musculoskeletal Exam: ***  CDAI Exam: CDAI Score: -- Patient Global: --; Provider Global: -- Swollen: --; Tender: -- Joint Exam 07/13/2023   No joint exam has been documented for this visit   There is currently no information documented on the homunculus. Go to the Rheumatology activity and complete the homunculus joint exam.  Investigation: No additional findings.  Imaging: No results found.  Recent Labs: Lab Results  Component Value Date   WBC 4.6 01/21/2023   HGB 13.9 01/21/2023   PLT 207  01/21/2023   NA 140 01/21/2023   K 4.3 01/21/2023   CL 104 01/21/2023   CO2 25 01/21/2023   GLUCOSE 69 (L) 01/21/2023   BUN 13 01/21/2023   CREATININE 0.82 01/21/2023   BILITOT 0.4 01/21/2023   ALKPHOS 100 01/21/2023   AST 25 01/21/2023   ALT 12 01/21/2023   PROT 6.6 01/21/2023   ALBUMIN 4.3 01/21/2023   CALCIUM  8.9 01/21/2023   GFRAA 92 06/17/2020    Speciality Comments: No specialty comments available.  Procedures:  No procedures performed Allergies: Codeine   Assessment / Plan:     Visit Diagnoses: No diagnosis found.  ***  Orders: No orders of the defined types were placed in this encounter.  No orders of the  defined types were placed in this encounter.    Follow-Up Instructions: No follow-ups on file.   Shelba SHAUNNA Potters, RT  Note - This record has been created using [employer].  Chart creation errors have been sought, but may not always  have been located. Such creation errors do not reflect on  the standard of medical care.

## 2023-07-01 ENCOUNTER — Other Ambulatory Visit: Payer: Self-pay | Admitting: Nurse Practitioner

## 2023-07-01 DIAGNOSIS — F419 Anxiety disorder, unspecified: Secondary | ICD-10-CM

## 2023-07-13 ENCOUNTER — Ambulatory Visit: Payer: PPO | Admitting: Internal Medicine

## 2023-07-13 DIAGNOSIS — S22000S Wedge compression fracture of unspecified thoracic vertebra, sequela: Secondary | ICD-10-CM

## 2023-07-13 DIAGNOSIS — G8111 Spastic hemiplegia affecting right dominant side: Secondary | ICD-10-CM

## 2023-07-13 DIAGNOSIS — R768 Other specified abnormal immunological findings in serum: Secondary | ICD-10-CM

## 2023-07-13 DIAGNOSIS — G5602 Carpal tunnel syndrome, left upper limb: Secondary | ICD-10-CM

## 2023-07-15 ENCOUNTER — Encounter: Payer: Self-pay | Admitting: Nurse Practitioner

## 2023-07-15 ENCOUNTER — Ambulatory Visit (INDEPENDENT_AMBULATORY_CARE_PROVIDER_SITE_OTHER): Payer: PPO | Admitting: Nurse Practitioner

## 2023-07-15 VITALS — BP 154/88 | HR 67 | Temp 97.6°F | Ht 65.0 in | Wt 148.0 lb

## 2023-07-15 DIAGNOSIS — J069 Acute upper respiratory infection, unspecified: Secondary | ICD-10-CM | POA: Diagnosis not present

## 2023-07-15 DIAGNOSIS — K146 Glossodynia: Secondary | ICD-10-CM | POA: Diagnosis not present

## 2023-07-15 MED ORDER — METHYLPREDNISOLONE ACETATE 80 MG/ML IJ SUSP
80.0000 mg | Freq: Once | INTRAMUSCULAR | Status: AC
  Administered 2023-07-15: 80 mg via INTRAMUSCULAR

## 2023-07-15 MED ORDER — FLUCONAZOLE 150 MG PO TABS: ORAL_TABLET | ORAL | 0 refills | Status: AC

## 2023-07-15 MED ORDER — LEVOFLOXACIN 500 MG PO TABS: 500.0000 mg | ORAL_TABLET | Freq: Every day | ORAL | 0 refills | Status: AC

## 2023-07-15 NOTE — Patient Instructions (Signed)

## 2023-07-15 NOTE — Progress Notes (Signed)
 Subjective:    Patient ID: Cheryl Reeves, female    DOB: [birth year], 53 y.o.   MRN: [id number]   Chief Complaint: Sinus Problem and Mouth still burning   She had virtual visit on 05/28/23 and was given anitbiotics, and steroids but is no better. Mouth feels raw and burning.  Sinus Problem This is a new problem. The current episode started 1 to 4 weeks ago. The problem has been waxing and waning since onset. There has been no fever. Her pain is at a severity of 7/10. The pain is mild. Associated symptoms include chills, congestion, coughing, sinus pressure and a sore throat. Pertinent negatives include no headaches. Past treatments include acetaminophen . The treatment provided mild relief.    Patient Active Problem List   Diagnosis Date Noted   Rheumatoid factor positive 03/05/2023   Anxiety 07/23/2022   Primary hypertension 07/23/2022   Pure hypercholesterolemia 07/23/2022   Spastic hemiparesis of right dominant side (HCC) 09/08/2021   Late effect of cerebrovascular accident (CVA) 09/08/2021   Chronic migraine w/o aura w/o status migrainosus, not intractable 01/27/2021   History of multiple allergies 01/31/2018   Compression fracture of thoracic vertebra (HCC) 09/27/2017   Carpal tunnel syndrome of left wrist 10/16/2016   Allergic rhinitis 10/09/2013   GERD (gastroesophageal reflux disease) 10/09/2013   Foot drop, right 10/09/2013   Rosacea, acne 10/09/2013   Seizures (HCC) 10/09/2013       Review of Systems  Constitutional:  Positive for chills. Negative for fever.  HENT:  Positive for congestion, sinus pressure and sore throat.   Respiratory:  Positive for cough.   Neurological:  Negative for dizziness and headaches.       Objective:   Physical Exam Constitutional:      Appearance: Normal appearance.  HENT:     Right Ear: Tympanic membrane normal.     Left Ear: Tympanic membrane normal.     Nose: Congestion and rhinorrhea present.     Mouth/Throat:      Mouth: Mucous membranes are moist.     Pharynx: No oropharyngeal exudate or posterior oropharyngeal erythema.  Cardiovascular:     Rate and Rhythm: Normal rate and regular rhythm.     Pulses: Normal pulses.     Heart sounds: Normal heart sounds.  Pulmonary:     Effort: Pulmonary effort is normal.     Breath sounds: Normal breath sounds.  Skin:    General: Skin is warm.  Neurological:     General: No focal deficit present.     Mental Status: She is alert and oriented to person, place, and time.  Psychiatric:        Mood and Affect: Mood normal.        Behavior: Behavior normal.    BP (!) 154/88   Pulse 67   Temp 97.6 F (36.4 C) (Temporal)   Ht 5' 5 (1.651 m)   Wt 148 lb (67.1 kg)   SpO2 98%   BMI 24.63 kg/m         Assessment & Plan:   Cheryl Reeves in today with chief complaint of Sinus Problem and Mouth still burning   1. URI with cough and congestion (Primary) 1. Take meds as prescribed 2. Use a cool mist humidifier especially during the winter months and when heat has been humid. 3. Use saline nose sprays frequently 4. Saline irrigations of the nose can be very helpful if done frequently.  * 4X daily for 1 week*  * Use  of a nettie pot can be helpful with this. Follow directions with this* 5. Drink plenty of fluids 6. Keep thermostat turn down low 7.For any cough or congestion- mucinex OTC 8. For fever or aces or pains- take tylenol  or ibuprofen  appropriate for age and weight.  * for fevers greater than 101 orally you may alternate ibuprofen  and tylenol  every  3 hours.    - levofloxacin  (LEVAQUIN ) 500 MG tablet; Take 1 tablet (500 mg total) by mouth daily for 7 days.  Dispense: 7 tablet; Refill: 0 - methylPREDNISolone  acetate (DEPO-MEDROL ) injection 80 mg  2. Burning mouth syndrome referral - Ambulatory referral to ENT  Meds ordered this encounter  Medications   levofloxacin  (LEVAQUIN ) 500 MG tablet    Sig: Take 1 tablet (500 mg total) by mouth  daily for 7 days.    Dispense:  7 tablet    Refill:  0    Supervising Provider:   DETTINGER, JOSHUA A [[id number]]   methylPREDNISolone  acetate (DEPO-MEDROL ) injection 80 mg   fluconazole  (DIFLUCAN ) 150 MG tablet    Sig: 1 po q week x 4 weeks    Dispense:  4 tablet    Refill:  0    Supervising Provider:   MARYANNE CHEW A [[id number]]     The above assessment and management plan was discussed with the patient. The patient verbalized understanding of and has agreed to the management plan. Patient is aware to call the clinic if symptoms persist or worsen. Patient is aware when to return to the clinic for a follow-up visit. Patient educated on when it is appropriate to go to the emergency department.   Cheryl Gladis, FNP

## 2023-07-23 ENCOUNTER — Ambulatory Visit: Payer: PPO | Admitting: Nurse Practitioner

## 2023-07-23 ENCOUNTER — Encounter: Payer: Self-pay | Admitting: Nurse Practitioner

## 2023-07-23 VITALS — BP 130/81 | HR 66 | Temp 97.2°F | Ht 65.0 in

## 2023-07-23 DIAGNOSIS — M21371 Foot drop, right foot: Secondary | ICD-10-CM | POA: Diagnosis not present

## 2023-07-23 DIAGNOSIS — R569 Unspecified convulsions: Secondary | ICD-10-CM | POA: Diagnosis not present

## 2023-07-23 DIAGNOSIS — I10 Essential (primary) hypertension: Secondary | ICD-10-CM | POA: Diagnosis not present

## 2023-07-23 DIAGNOSIS — F419 Anxiety disorder, unspecified: Secondary | ICD-10-CM

## 2023-07-23 DIAGNOSIS — F5101 Primary insomnia: Secondary | ICD-10-CM

## 2023-07-23 DIAGNOSIS — E7800 Pure hypercholesterolemia, unspecified: Secondary | ICD-10-CM | POA: Diagnosis not present

## 2023-07-23 DIAGNOSIS — I6930 Unspecified sequelae of cerebral infarction: Secondary | ICD-10-CM | POA: Diagnosis not present

## 2023-07-23 DIAGNOSIS — G8111 Spastic hemiplegia affecting right dominant side: Secondary | ICD-10-CM | POA: Diagnosis not present

## 2023-07-23 DIAGNOSIS — K219 Gastro-esophageal reflux disease without esophagitis: Secondary | ICD-10-CM | POA: Diagnosis not present

## 2023-07-23 DIAGNOSIS — G43709 Chronic migraine without aura, not intractable, without status migrainosus: Secondary | ICD-10-CM

## 2023-07-23 LAB — CMP14+EGFR
ALT: 7 [IU]/L (ref 0–32)
AST: 15 [IU]/L (ref 0–40)
Albumin: 4.6 g/dL (ref 3.8–4.9)
Alkaline Phosphatase: 90 [IU]/L (ref 44–121)
BUN/Creatinine Ratio: 16 (ref 9–23)
BUN: 15 mg/dL (ref 6–24)
Bilirubin Total: 0.8 mg/dL (ref 0.0–1.2)
CO2: 23 mmol/L (ref 20–29)
Calcium: 9.4 mg/dL (ref 8.7–10.2)
Chloride: 103 mmol/L (ref 96–106)
Creatinine, Ser: 0.96 mg/dL (ref 0.57–1.00)
Globulin, Total: 2.3 g/dL (ref 1.5–4.5)
Glucose: 74 mg/dL (ref 70–99)
Potassium: 4.8 mmol/L (ref 3.5–5.2)
Sodium: 140 mmol/L (ref 134–144)
Total Protein: 6.9 g/dL (ref 6.0–8.5)
eGFR: 71 mL/min/{1.73_m2} (ref >59)

## 2023-07-23 LAB — CBC WITH DIFFERENTIAL/PLATELET
Basophils Absolute: 0 10*3/uL (ref 0.0–0.2)
Basos: 1 %
EOS (ABSOLUTE): 0.1 10*3/uL (ref 0.0–0.4)
Eos: 2 %
Hematocrit: 42.6 % (ref 34.0–46.6)
Hemoglobin: 14.6 g/dL (ref 11.1–15.9)
Immature Grans (Abs): 0 10*3/uL (ref 0.0–0.1)
Immature Granulocytes: 0 %
Lymphocytes Absolute: 2.3 10*3/uL (ref 0.7–3.1)
Lymphs: 32 %
MCH: 33.6 pg — ABNORMAL HIGH (ref 26.6–33.0)
MCHC: 34.3 g/dL (ref 31.5–35.7)
MCV: 98 fL — ABNORMAL HIGH (ref 79–97)
Monocytes Absolute: 0.6 10*3/uL (ref 0.1–0.9)
Monocytes: 9 %
Neutrophils Absolute: 4 10*3/uL (ref 1.4–7.0)
Neutrophils: 56 %
Platelets: 235 10*3/uL (ref 150–450)
RBC: 4.34 x10E6/uL (ref 3.77–5.28)
RDW: 11.9 % (ref 11.7–15.4)
WBC: 7.1 10*3/uL (ref 3.4–10.8)

## 2023-07-23 LAB — LIPID PANEL
Chol/HDL Ratio: 2.5 {ratio} (ref 0.0–4.4)
Cholesterol, Total: 165 mg/dL (ref 100–199)
HDL: 66 mg/dL (ref >39)
LDL Chol Calc (NIH): 77 mg/dL (ref 0–99)
Triglycerides: 128 mg/dL (ref 0–149)
VLDL Cholesterol Cal: 22 mg/dL (ref 5–40)

## 2023-07-23 MED ORDER — PANTOPRAZOLE SODIUM 40 MG PO TBEC: 40.0000 mg | DELAYED_RELEASE_TABLET | Freq: Every day | ORAL | 1 refills | Status: DC

## 2023-07-23 MED ORDER — BACLOFEN 10 MG PO TABS: ORAL_TABLET | ORAL | 1 refills | Status: DC

## 2023-07-23 MED ORDER — LISINOPRIL 20 MG PO TABS: 20.0000 mg | ORAL_TABLET | Freq: Every day | ORAL | 1 refills | Status: DC

## 2023-07-23 MED ORDER — ZOLPIDEM TARTRATE 5 MG PO TABS: 5.0000 mg | ORAL_TABLET | Freq: Every evening | ORAL | 1 refills | Status: DC | PRN

## 2023-07-23 MED ORDER — ATORVASTATIN CALCIUM 20 MG PO TABS: 20.0000 mg | ORAL_TABLET | Freq: Every day | ORAL | 1 refills | Status: DC

## 2023-07-23 NOTE — Patient Instructions (Signed)
Fall Prevention in the Home, Adult Falls can cause injuries and can happen to people of all ages. There are many things you can do to make your home safer and to help prevent falls. What actions can I take to prevent falls? General information Use good lighting in all rooms. Make sure to: Replace any light bulbs that burn out. Turn on the lights in dark areas and use night-lights. Keep items that you use often in easy-to-reach places. Lower the shelves around your home if needed. Move furniture so that there are clear paths around it. Do not use throw rugs or other things on the floor that can make you trip. If any of your floors are uneven, fix them. Add color or contrast paint or tape to clearly mark and help you see: Grab bars or handrails. First and last steps of staircases. Where the edge of each step is. If you use a ladder or stepladder: Make sure that it is fully opened. Do not climb a closed ladder. Make sure the sides of the ladder are locked in place. Have someone hold the ladder while you use it. Know where your pets are as you move through your home. What can I do in the bathroom?     Keep the floor dry. Clean up any water on the floor right away. Remove soap buildup in the bathtub or shower. Buildup makes bathtubs and showers slippery. Use non-skid mats or decals on the floor of the bathtub or shower. Attach bath mats securely with double-sided, non-slip rug tape. If you need to sit down in the shower, use a non-slip stool. Install grab bars by the toilet and in the bathtub and shower. Do not use towel bars as grab bars. What can I do in the bedroom? Make sure that you have a light by your bed that is easy to reach. Do not use any sheets or blankets on your bed that hang to the floor. Have a firm chair or bench with side arms that you can use for support when you get dressed. What can I do in the kitchen? Clean up any spills right away. If you need to reach something  above you, use a step stool with a grab bar. Keep electrical cords out of the way. Do not use floor polish or wax that makes floors slippery. What can I do with my stairs? Do not leave anything on the stairs. Make sure that you have a light switch at the top and the bottom of the stairs. Make sure that there are handrails on both sides of the stairs. Fix handrails that are broken or loose. Install non-slip stair treads on all your stairs if they do not have carpet. Avoid having throw rugs at the top or bottom of the stairs. Choose a carpet that does not hide the edge of the steps on the stairs. Make sure that the carpet is firmly attached to the stairs. Fix carpet that is loose or worn. What can I do on the outside of my home? Use bright outdoor lighting. Fix the edges of walkways and driveways and fix any cracks. Clear paths of anything that can make you trip, such as tools or rocks. Add color or contrast paint or tape to clearly mark and help you see anything that might make you trip as you walk through a door, such as a raised step or threshold. Trim any bushes or trees on paths to your home. Check to see if handrails are loose  or broken and that both sides of all steps have handrails. Install guardrails along the edges of any raised decks and porches. Have leaves, snow, or ice cleared regularly. Use sand, salt, or ice melter on paths if you live where there is ice and snow during the winter. Clean up any spills in your garage right away. This includes grease or oil spills. What other actions can I take? Review your medicines with your doctor. Some medicines can cause dizziness or changes in blood pressure, which increase your risk of falling. Wear shoes that: Have a low heel. Do not wear high heels. Have rubber bottoms and are closed at the toe. Feel good on your feet and fit well. Use tools that help you move around if needed. These include: Canes. Walkers. Scooters. Crutches. Ask  your doctor what else you can do to help prevent falls. This may include seeing a physical therapist to learn to do exercises to move better and get stronger. Where to find more information Centers for Disease Control and Prevention, STEADI: TonerPromos.no [employer] on Aging: BaseRingTones.pl National Institute on Aging: BaseRingTones.pl Contact a doctor if: You are afraid of falling at home. You feel weak, drowsy, or dizzy at home. You fall at home. Get help right away if you: Lose consciousness or have trouble moving after a fall. Have a fall that causes a head injury. These symptoms may be an emergency. Get help right away. Call 911. Do not wait to see if the symptoms will go away. Do not drive yourself to the hospital. This information is not intended to replace advice given to you by your health care provider. Make sure you discuss any questions you have with your health care provider. Document Revised: 01/26/2022 Document Reviewed: 01/26/2022 Elsevier Patient Education  2024 [employer].

## 2023-07-23 NOTE — Progress Notes (Signed)
Subjective:    Patient ID: Cheryl Reeves, female    DOB: 1971-04-28, 53 y.o.   MRN: [id number]   Chief Complaint: medical management of chronic issues     HPI:  Cheryl Reeves is a 53 y.o. who identifies as a female who was assigned female at birth.   Social history: Lives with: husband Work history: disability   Comes in today for follow up of the following chronic medical issues:  1. Late effect of cerebrovascular accident (CVA) 2. Spastic hemiparesis of right dominant side (HCC) 3. Seizures (HCC) 4. Foot drop, right Patient had cva years ago. She has permanent effects on her right side side. She has severe foot drop and has to walk with a cane to maintain her balance. She falls frequently. No fall in the last several months. She gets xeomin injections every 3-4 months for her spastic movements.   5. Chronic migraine w/o aura w/o status migrainosus, not intractable She has frequent migraines. She is on zonegran nightly for prevention. She is not a candidate  for triptans. 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  6. Gastroesophageal reflux disease, unspecified whether esophagitis present She takes protonix daily, which works well for her.  7. Anxiety Weaned herself off wellbutirn. Takes vistaril at night now.    07/23/2023    8:40 AM 01/21/2023    8:40 AM 12/14/2022   10:08 AM 08/13/2022   10:38 AM  GAD 7 : Generalized Anxiety Score  Nervous, Anxious, on Edge 0 0 0 0  Control/stop worrying 0 0 0 0  Worry too much - different things 0 0 0 0  Trouble relaxing 0 0 1 0  Restless 0 0 1 0  Easily annoyed or irritable 0 0 1 0  Afraid - awful might happen 0 0 0 0  Total GAD 7 Score 0 0 3 0  Anxiety Difficulty Somewhat difficult Not difficult at all Not difficult at all Not difficult at all     8. Hypertension No c/o chest pain, sob or headache. Does not check blood pressure at home. BP Readings from Last 3  Encounters:  07/15/23 (!) 154/88  04/12/23 138/79  03/05/23 107/74   9. Hyperlipidemia Does not really watch diet and does no dedicated exercise Lab Results  Component Value Date   CHOL 157 01/21/2023   HDL 62 01/21/2023   LDLCALC 69 01/21/2023   TRIG 153 (H) 01/21/2023   CHOLHDL 2.5 01/21/2023      New complaints: None today  Allergies  Allergen Reactions   Codeine     headaches   Outpatient Encounter Medications as of 07/23/2023  Medication Sig   aspirin 81 MG tablet Take 81 mg by mouth daily.   atorvastatin (LIPITOR) 20 MG tablet Take 1 tablet (20 mg total) by mouth daily.   baclofen (LIORESAL) 10 MG tablet TAKE ONE (1) TABLET BY MOUTH TWO (2) TIMES DAILY   celecoxib (CELEBREX) 200 MG capsule TAKE ONE (1) CAPSULE BY MOUTH 2 TIMES DAILY   Cyanocobalamin (VITAMIN B 12 PO) Take by mouth.   fluconazole (DIFLUCAN) 150 MG tablet 1 po q week x 4 weeks   fluticasone (FLONASE) 50 MCG/ACT nasal spray Place 2 sprays into both nostrils daily.   hydrOXYzine (VISTARIL) 25 MG capsule TAKE 1 CAPSULE BY MOUTH TWICE DAILY AS NEEDED   ibuprofen (ADVIL) 800 MG tablet TAKE 1 TABLET BY MOUTH EVERY 8 HOURS AS NEEDED FOR PAIN  lisinopril (ZESTRIL) 20 MG tablet TAKE ONE (1) TABLET BY MOUTH EVERY DAY   Multiple Vitamins-Minerals (MULTIVITAMIN WITH MINERALS) tablet Take 1 tablet by mouth daily.   ondansetron (ZOFRAN-ODT) 4 MG disintegrating tablet TAKE 1 TABLET BY MOUTH EVERY 8 HOURS AS NEEDED FOR NAUSEA & VOMITING   pantoprazole (PROTONIX) 40 MG tablet Take 1 tablet (40 mg total) by mouth daily.   zonisamide (ZONEGRAN) 100 MG capsule Take 2 capsules (200 mg total) by mouth at bedtime.   Facility-Administered Encounter Medications as of 07/23/2023  Medication   incobotulinumtoxinA (XEOMIN) 100 units injection 300 Units   incobotulinumtoxinA (XEOMIN) 100 units injection 300 Units    Past Surgical History:  Procedure Laterality Date   ARTHROSCOPY KNEE W/ DRILLING  2008   rt   REPLACEMENT  TOTAL KNEE Right 2009   SEPTOPLASTY Bilateral 07/21/2013   Procedure: SEPTOPLASTY;  Surgeon: Suzanna Obey, MD;  Location: [facility];  Service: ENT;  Laterality: Bilateral;   SINUS ENDO W/FUSION Bilateral 07/21/2013   Procedure: ENDOSCOPIC SINUS SURGERY WITH FUSION NAVIGATION;  Surgeon: Suzanna Obey, MD;  Location: [facility];  Service: ENT;  Laterality: Bilateral;   TUBAL LIGATION  2000    Family History  Problem Relation Age of Onset   Cancer Mother        lung   Arthritis Mother    Heart disease Father    Cancer Father        lung    Hypertension Father    Heart disease Brother    Hypertension Brother    Diabetes Other    Arthritis/Rheumatoid Other    Breast cancer Neg Hx       Controlled substance contract: n/a     Review of Systems  Constitutional:  Negative for diaphoresis.  Eyes:  Negative for pain.  Respiratory:  Negative for shortness of breath.   Cardiovascular:  Negative for chest pain, palpitations and leg swelling.  Gastrointestinal:  Negative for abdominal pain.  Endocrine: Negative for polydipsia.  Skin:  Negative for rash.  Neurological:  Negative for dizziness, weakness and headaches.  Hematological:  Does not bruise/bleed easily.  All other systems reviewed and are negative.      Objective:   Physical Exam Vitals and nursing note reviewed.  Constitutional:      General: She is not in acute distress.    Appearance: Normal appearance. She is well-developed.  HENT:     Head: Normocephalic.     Right Ear: Tympanic membrane normal.     Left Ear: Tympanic membrane normal.     Nose: Nose normal.     Mouth/Throat:     Mouth: Mucous membranes are moist.  Eyes:     Pupils: Pupils are equal, round, and reactive to light.  Neck:     Vascular: No carotid bruit or JVD.  Cardiovascular:     Rate and Rhythm: Normal rate and regular rhythm.     Heart sounds: Normal heart sounds.  Pulmonary:     Effort: Pulmonary effort is  normal. No respiratory distress.     Breath sounds: Normal breath sounds. No wheezing or rales.  Chest:     Chest wall: No tenderness.  Abdominal:     General: Bowel sounds are normal. There is no distension or abdominal bruit.     Palpations: Abdomen is soft. There is no hepatomegaly, splenomegaly, mass or pulsatile mass.     Tenderness: There is no abdominal tenderness.  Musculoskeletal:  General: Normal range of motion.     Cervical back: Normal range of motion and neck supple.     Comments: Right sided weakness  Walks with cane for stability  Lymphadenopathy:     Cervical: No cervical adenopathy.  Skin:    General: Skin is warm and dry.  Neurological:     Mental Status: She is alert and oriented to person, place, and time.     Deep Tendon Reflexes: Reflexes are normal and symmetric.  Psychiatric:        Behavior: Behavior normal.        Thought Content: Thought content normal.        Judgment: Judgment normal.     BP 130/81   Pulse 66   Temp (!) 97.2 F (36.2 C)   Ht 5\' 5"  (1.651 m)   SpO2 100%   BMI 24.63 kg/m         Assessment & Plan:   Cheryl Reeves comes in today with chief complaint of medical management of chronic issues    Diagnosis and orders addressed:  1. Late effect of cerebrovascular accident (CVA) Fall prevention - CBC with Differential/Platelet - CMP14+EGFR - Lipid panel - baclofen (LIORESAL) 10 MG tablet; TAKE ONE (1) TABLET BY MOUTH TWO (2) TIMES DAILY  Dispense: 60 each; Refill: 2  2. Spastic hemiparesis of right dominant side (HCC) Fall prevention 3. Seizures (HCC) Report any seizure activity 4. Foot drop, right Keep followup with neurology  5. Chronic migraine w/o aura w/o status migrainosus, not intractable  6. Gastroesophageal reflux disease, unspecified whether esophagitis present Avoid spicy foods Do not eat 2 hours prior to bedtime - pantoprazole (PROTONIX) 40 MG tablet; Take 1 tablet (40 mg total) by mouth  daily.  Dispense: 90 tablet; Refill: 1  7. Anxiety Stress management  8. Thrush - nystatin (MYCOSTATIN) 100000 UNIT/ML suspension; Take 5 mLs (500,000 Units total) by mouth 4 (four) times daily.  Dispense: 60 mL; Refill: 0   9. Primary hypertension Low sodium diet - lisinopril (ZESTRIL) 20 MG tablet; Take 1 tablet (20 mg total) by mouth daily.  Dispense: 90 tablet; Refill: 1  10. Pure hypercholesterolemia Low fat diet - atorvastatin (LIPITOR) 20 MG tablet; Take 1 tablet (20 mg total) by mouth daily.  Dispense: 90 tablet; Refill: 1  11. Insomnia Ambien 5mg  I po qhs  Labs pending Health Maintenance reviewed Diet and exercise encouraged  Follow up plan: 6 months   [name], FNP

## 2023-07-27 ENCOUNTER — Ambulatory Visit (INDEPENDENT_AMBULATORY_CARE_PROVIDER_SITE_OTHER): Payer: PPO | Admitting: Neurology

## 2023-07-27 ENCOUNTER — Encounter: Payer: Self-pay | Admitting: Neurology

## 2023-07-27 ENCOUNTER — Telehealth: Payer: Self-pay | Admitting: Neurology

## 2023-07-27 VITALS — BP 130/83 | HR 75 | Ht 65.0 in | Wt 145.5 lb

## 2023-07-27 DIAGNOSIS — G43709 Chronic migraine without aura, not intractable, without status migrainosus: Secondary | ICD-10-CM

## 2023-07-27 DIAGNOSIS — G8111 Spastic hemiplegia affecting right dominant side: Secondary | ICD-10-CM | POA: Diagnosis not present

## 2023-07-27 DIAGNOSIS — I639 Cerebral infarction, unspecified: Secondary | ICD-10-CM | POA: Insufficient documentation

## 2023-07-27 DIAGNOSIS — Z8673 Personal history of transient ischemic attack (TIA), and cerebral infarction without residual deficits: Secondary | ICD-10-CM | POA: Insufficient documentation

## 2023-07-27 DIAGNOSIS — G40909 Epilepsy, unspecified, not intractable, without status epilepticus: Secondary | ICD-10-CM

## 2023-07-27 MED ORDER — ZONISAMIDE 100 MG PO CAPS: 100.0000 mg | ORAL_CAPSULE | Freq: Every day | ORAL | 3 refills | Status: AC

## 2023-07-27 MED ORDER — UBRELVY 50 MG PO TABS: ORAL_TABLET | ORAL | 11 refills | Status: AC

## 2023-07-27 MED ORDER — ONDANSETRON 4 MG PO TBDP: 4.0000 mg | ORAL_TABLET | Freq: Three times a day (TID) | ORAL | 3 refills | Status: DC | PRN

## 2023-07-27 NOTE — Telephone Encounter (Signed)
Refill on Zofran has been sent to the drug store in [place] as requested by pt.

## 2023-07-27 NOTE — Telephone Encounter (Signed)
Pt asking for a refill on her Zofran to be sent to [facility] in [place].

## 2023-07-27 NOTE — Progress Notes (Unsigned)
Chief Complaint  Patient presents with   Room 14    Pt is here with her Niece. Pt states that she has been better since her last appointment.       ASSESSMENT AND PLAN  Cheryl Reeves is a 53 y.o. female   Chronic migraine  Zonegran 100 mg every night as preventive medication  Not a good candidate for triptan, will try Bernita Raisin as needed, has the habit of frequent over-the-counter medication use, UDS was also positive for cocaine  History of seizure  1 seizure was during her stroke in 1999, recurrent seizure couple years later, was on Tegretol now switched to zonisamide, 100 mg every night, with no recurrent seizure, we will continue current medications  Check level, tsh  History of stroke in 1999, with residual right hemiparesis, Response to botulism toxin injection in the past, could not continue due to insurance-co-pay limitation  DIAGNOSTIC DATA (LABS, IMAGING, TESTING) - I reviewed patient records, labs, notes, testing and imaging myself where available.   MEDICAL HISTORY:  Cheryl Reeves is a 53 year old female, follow-up for seizure, migraine, stroke, her primary care is [facility] NP  Daphine Deutscher, Mary-Margaret,    History is obtained from the patient and review of electronic medical records. I personally reviewed pertinent available imaging films in PACS.   PMHx of  HTN HLD Stroke   She suffered stroke in 1999, was attributed to post partum hemorrhage, with mild aphasia, mild right upper extremity weakness, spastic gait right lower extremity weakness, she also had one seizure when she had a stroke, recurrent seizure in 2001, taking Tegretol 200 mg twice a day   She has frequent headaches even before she had stroke,  had severe headache the day she had stroke, and persistent almost daily headache ever since, since 2014, she has moderate sinus pressure headaches, has been taking daily ibuprofen 10-12 tablets each day, has tried different  over-the-counter sinus medications without helping her symptoms.   She also suffered depression anxiety, was taking Wellbutrin, but not on it anymore MRI of the brain in 2007, left frontal encephalomalacia  For her frequent migraine headache, for a while she was treated with nortriptyline, which was helpful, she has a habit of frequent over-the-counter medication use, despite multiple previous attempts for her to stop   For a while she was getting every 3 months botulism toxin injection for her spastic right upper extremity which has been helpful, but due to the cost concern, she was not able to continue the injection  She has lost follow-up since 2023, still has frequent headaches, declining functional status, increased gait abnormality, body achy pain,  UDS positive for cocaine in January 2025  MRI of brain in 2007:  No acute intracranial abnormality.  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     Concern of long-term side effects with Tegretol including osteoporosis close she was switched to zonisamide supposed to take 200 mg daily now only take 100 mg every night  PHYSICAL EXAM:   Vitals:   07/27/23 1128  BP: 130/83  Pulse: 75  Weight: 145 lb 8 oz (66 kg)  Height: 5\' 5"  (1.651 m)   Not recorded     Body mass index is 24.21 kg/m.  PHYSICAL EXAMNIATION:  Gen: NAD, conversant, well nourised, well groomed                     Cardiovascular: Regular rate rhythm, no peripheral  edema, warm, nontender. Eyes: Conjunctivae clear without exudates or hemorrhage Neck: Supple, no carotid bruits. Pulmonary: Clear to auscultation bilaterally   NEUROLOGICAL EXAM:  MENTAL STATUS: Speech/cognition: Awake, alert, oriented to history taking and casual conversation CRANIAL NERVES: CN II: Visual fields are full to confrontation. Pupils are round equal and briskly reactive to light. CN III, IV, VI: extraocular movement are  normal. No ptosis. CN V: Facial sensation is intact to light touch CN VII: Face is symmetric with normal eye closure  CN VIII: Hearing is normal to causal conversation. CN IX, X: Phonation is normal. CN XI: Head turning and shoulder shrug are intact  MOTOR: Spastic right hemiparesis antigravity movement of right upper and lower extremity  REFLEXES: Hyperreflexia of right upper and lower extremity  SENSORY: Intact to light touch, pinprick and vibratory sensation are intact in fingers and toes.  COORDINATION: There is no trunk or limb dysmetria noted.  GAIT/STANCE: Spastic right Hemi circumferential gait REVIEW OF SYSTEMS:  Full 14 system review of systems performed and notable only for as above All other review of systems were negative.   ALLERGIES: Allergies  Allergen Reactions   Codeine     headaches    HOME MEDICATIONS: Current Outpatient Medications  Medication Sig Dispense Refill   aspirin 81 MG tablet Take 81 mg by mouth daily.     atorvastatin (LIPITOR) 20 MG tablet Take 1 tablet (20 mg total) by mouth daily. 90 tablet 1   baclofen (LIORESAL) 10 MG tablet TAKE ONE (1) TABLET BY MOUTH TWO (2) TIMES DAILY 180 each 1   celecoxib (CELEBREX) 200 MG capsule TAKE ONE (1) CAPSULE BY MOUTH 2 TIMES DAILY 60 capsule 0   Cyanocobalamin (VITAMIN B 12 PO) Take by mouth.     fluconazole (DIFLUCAN) 150 MG tablet 1 po q week x 4 weeks 4 tablet 0   fluticasone (FLONASE) 50 MCG/ACT nasal spray Place 2 sprays into both nostrils daily. 16 g 6   hydrOXYzine (VISTARIL) 25 MG capsule TAKE 1 CAPSULE BY MOUTH TWICE DAILY AS NEEDED 180 capsule 0   ibuprofen (ADVIL) 800 MG tablet TAKE 1 TABLET BY MOUTH EVERY 8 HOURS AS NEEDED FOR PAIN 21 tablet 3   lisinopril (ZESTRIL) 20 MG tablet Take 1 tablet (20 mg total) by mouth daily. 90 tablet 1   Multiple Vitamins-Minerals (MULTIVITAMIN WITH MINERALS) tablet Take 1 tablet by mouth daily.     ondansetron (ZOFRAN-ODT) 4 MG disintegrating tablet TAKE 1  TABLET BY MOUTH EVERY 8 HOURS AS NEEDED FOR NAUSEA & VOMITING 20 tablet 6   pantoprazole (PROTONIX) 40 MG tablet Take 1 tablet (40 mg total) by mouth daily. 90 tablet 1   zolpidem (AMBIEN) 5 MG tablet Take 1 tablet (5 mg total) by mouth at bedtime as needed for sleep. 15 tablet 1   zonisamide (ZONEGRAN) 100 MG capsule Take 2 capsules (200 mg total) by mouth at bedtime. 60 capsule 11   Current Facility-Administered Medications  Medication Dose Route Frequency Provider Last Rate Last Admin   incobotulinumtoxinA (XEOMIN) 100 units injection 300 Units  300 Units Intramuscular Q90 days Levert Feinstein, MD   300 Units at 09/08/21 1634   incobotulinumtoxinA (XEOMIN) 100 units injection 300 Units  300 Units Intramuscular Q90 days Levert Feinstein, MD   300 Units at 12/05/21 1005    PAST MEDICAL HISTORY: Past Medical History:  Diagnosis Date   Allergy    DJD (degenerative joint disease)    GERD (gastroesophageal reflux disease)    Headache  Stroke [facility]) 1999   S/p childbirth-weakness rt leg   Thoracic injuries    Wears glasses     PAST SURGICAL HISTORY: Past Surgical History:  Procedure Laterality Date   ARTHROSCOPY KNEE W/ DRILLING  2008   rt   REPLACEMENT TOTAL KNEE Right 2009   SEPTOPLASTY Bilateral 07/21/2013   Procedure: SEPTOPLASTY;  Surgeon: Suzanna Obey, MD;  Location: [facility];  Service: ENT;  Laterality: Bilateral;   SINUS ENDO W/FUSION Bilateral 07/21/2013   Procedure: ENDOSCOPIC SINUS SURGERY WITH FUSION NAVIGATION;  Surgeon: Suzanna Obey, MD;  Location: [facility];  Service: ENT;  Laterality: Bilateral;   TUBAL LIGATION  2000    FAMILY HISTORY: Family History  Problem Relation Age of Onset   Cancer Mother        lung   Arthritis Mother    Heart disease Father    Cancer Father        lung    Hypertension Father    Heart disease Brother    Hypertension Brother    Diabetes Other    Arthritis/Rheumatoid Other    Breast cancer Neg Hx     SOCIAL  HISTORY: Social History   Socioeconomic History   Marital status: Married    Spouse name: Zella Ball   Number of children: 2   Years of education: 10   Highest education level: 10th grade  Occupational History   Occupation: Disabled  Tobacco Use   Smoking status: Every Day    Current packs/day: 0.50    Average packs/day: 0.5 packs/day for 30.0 years (15.0 ttl pk-yrs)    Types: Cigarettes   Smokeless tobacco: Never  Vaping Use   Vaping status: Never Used  Substance and Sexual Activity   Alcohol use: Yes    Alcohol/week: 4.0 standard drinks of alcohol    Types: 4 Cans of beer per week    Comment: occ   Drug use: Yes    Types: Marijuana    Comment: occasionally   Sexual activity: Yes  Other Topics Concern   Not on file  Social History Narrative   Lives at home with son   Married, 1 son and 1 stepson , 1 daughter    Right handed   10 th grade   3-4 cups caffeine per day.   Social Drivers of Corporate investment banker Strain: Low Risk  (09/16/2022)   Overall Financial Resource Strain (CARDIA)    Difficulty of Paying Living Expenses: Not hard at all  Food Insecurity: No Food Insecurity (09/16/2022)   Hunger Vital Sign    Worried About Running Out of Food in the Last Year: Never true    Ran Out of Food in the Last Year: Never true  Transportation Needs: No Transportation Needs (09/16/2022)   PRAPARE - Administrator, Civil Service (Medical): No    Lack of Transportation (Non-Medical): No  Physical Activity: Insufficiently Active (09/16/2022)   Exercise Vital Sign    Days of Exercise per Week: 3 days    Minutes of Exercise per Session: 30 min  Stress: No Stress Concern Present (09/16/2022)   [employer] of Occupational Health - Occupational Stress Questionnaire    Feeling of Stress : Not at all  Social Connections: Moderately Integrated (09/16/2022)   Social Connection and Isolation Panel [NHANES]    Frequency of Communication with Friends and Family: More  than three times a week    Frequency of Social Gatherings with Friends and Family: More than  three times a week    Attends Religious Services: More than 4 times per year    Active Member of Clubs or Organizations: No    Attends Banker Meetings: Never    Marital Status: Married  Catering manager Violence: Not At Risk (09/16/2022)   Humiliation, Afraid, Rape, and Kick questionnaire    Fear of Current or Ex-Partner: No    Emotionally Abused: No    Physically Abused: No    Sexually Abused: No      Levert Feinstein, M.D. Ph.D.  [facility] [address], [address] [place], [place] [address] Ph: [phone] Fax: [phone]  CC:  Bennie Pierini, FNP [address] [place],  [place] [address]  Bennie Pierini, FNP

## 2023-07-27 NOTE — Addendum Note (Signed)
Addended by: Berna Spare A on: 07/27/2023 12:50 PM   Modules accepted: Orders

## 2023-07-28 ENCOUNTER — Telehealth: Payer: Self-pay | Admitting: Pharmacist

## 2023-07-28 LAB — ZONISAMIDE LEVEL: Zonisamide: 4.8 ug/mL — ABNORMAL LOW (ref 10.0–40.0)

## 2023-07-28 LAB — TSH: TSH: 1.22 u[IU]/mL (ref 0.450–4.500)

## 2023-07-28 NOTE — Telephone Encounter (Signed)
Pharmacy Patient Advocate Encounter   Received notification from Patient Pharmacy that prior authorization for Ubrelvy 50MG  tablets is required/requested.   Insurance verification completed.   The patient is insured through [employer] ADVANTAGE/RX ADVANCE .   Per test claim: PA required; PA submitted to above mentioned insurance via CoverMyMeds Key/confirmation #/EOC [id number] Status is pending

## 2023-07-29 ENCOUNTER — Other Ambulatory Visit (HOSPITAL_COMMUNITY): Payer: Self-pay

## 2023-07-29 NOTE — Telephone Encounter (Signed)
Pharmacy Patient Advocate Encounter  Received notification from [facility] [employer]ANT[employer]/RX ADVANCE that Prior Authorization for Ubrelvy 50MG  tablets has been APPROVED from 07/29/2023 to 07/28/2024. Ran test claim, Copay is $411.99. This test claim was processed through [facility]- copay amounts may vary at other pharmacies due to pharmacy/plan contracts, or as the patient moves through the different stages of their insurance plan.   PA #/Case ID/Reference #: PA Case ID #: [id number]

## 2023-08-04 ENCOUNTER — Other Ambulatory Visit: Payer: Self-pay | Admitting: Nurse Practitioner

## 2023-08-06 ENCOUNTER — Encounter: Payer: Self-pay | Admitting: Neurology

## 2023-08-26 NOTE — Progress Notes (Signed)
 Office Visit Note  Patient: Cheryl Reeves             Date of Birth: 09/10/1970           MRN: [id number]             PCP: Bennie Pierini, FNP Referring: Bennie Pierini, * Visit Date: 09/08/2023   Subjective:  Follow-up (Patient states the shot in her left wrist helped last time and now the pain is back. )   Discussed the use of AI scribe software for clinical note transcription with the patient, who gave verbal consent to proceed.  History of Present Illness   Cheryl Reeves is a 53 year old female with carpal tunnel syndrome who presents with worsening symptoms in the left hand and wrist.  She has been experiencing worsening symptoms of carpal tunnel syndrome in her left hand and wrist over the past several weeks. Previously, she received a corticosteroid injection which provided significant relief, but her symptoms have returned. She experiences tingling, particularly at night, and swelling in her left hand. There is also some swelling in her right hand, though it is less severe. She has tried using a wrist brace, which provided minimal relief, but she misplaced it. The brace was obtained from a drugstore after the store where she had a prescription closed down. No recent falls, injuries, or changes in activities have been noted.  She has a history of osteoarthritis, which contributes to joint pain and tenderness, particularly in the left hand and wrist. There is no significant swelling in other joints. A previous rheumatoid factor test was negative, ruling out rheumatoid arthritis.  She uses a cane for stability due to hemiparesis from a stroke in 1999. This condition may influence her carpal tunnel syndrome symptoms due to altered biomechanics and increased use of the left hand for support.   Previous HPI 04/12/2023 Cheryl Reeves is a 53 y.o. female here for follow up for ongoing joint pain in her hands and back and positive rheumatoid factor.  Workup at  initial visit was negative for serum inflammatory markers and for CCP antibody.  X-ray of the hands was consistent with osteoarthritis involving the MCP joints on her left hand.  She has tried doing the range of motion exercises and wearing a rigid wrist brace at night and sometimes in the day without any resolution of symptoms.  Still wakes her up from sleep frequently with hand pain and numbness.    Previous HPI 03/05/23 Cheryl Reeves is a 53 y.o. female here for evaluation of joint pain in multiple areas especially in her hands and back and with positive rheumatoid factor.  She has somewhat longstanding back pain although this has been worse especially in the past year.  She had a previous stroke with residual spastic hemiparesis of the right side and since then feels she keeps a degree of muscle pain and soreness.  She had right knee replacement in 2009.  She has had chronic pain and stiffness in her hand performed with study for left wrist carpal tunnel syndrome in 2018 showing moderately severe disease.  She tried using a wrist brace with limited benefit also did not find 1 that fit well without causing pressure areas on her hand.  In the past year hand pain is doing worse especially on the left side which she does use much more heavily due to residual right-sided deficit.  She notices swelling at sometimes but not on others not associated with  specific time of day position or activity.  She feels like there are nodules or more prominent bumps on the palmar side under her MCP joint.  Does not notice an increase in foot symptoms she does use ankle-foot orthotic for right sided foot drop.  Currently prescribed Celebrex 200 mg twice daily and takes baclofen 10 mg twice daily for spastic paresis.  Also treated with Xeomin injections and takes ibuprofen as needed. Besides joint pain she has numerous widespread symptoms including chronic fatigue, dryness of eyes and mouth, intermittent painful mouth sores,  alternating constipation and diarrhea, and frequent dizziness and headaches. Does not report cervical axillary lymphadenopathy, Raynaud's symptoms, abnormal bruising or bleeding. Mother had rheumatoid arthritis with deforming arthritis of both hands.  She has some concern regarding RA treatments due to her mother developing lung cancer on long-term methotrexate treatment though she was also a long-term cigarette smoker.     Labs reviewed 07/2022 RF 16.4 ESR 2   Labs reviewed 03/2021 RF 23.3   Review of Systems  Constitutional:  Positive for fatigue.  HENT:  Positive for mouth dryness. Negative for mouth sores.   Eyes:  Negative for dryness.  Respiratory:  Negative for shortness of breath.   Cardiovascular:  Negative for chest pain and palpitations.  Gastrointestinal:  Positive for constipation and diarrhea. Negative for blood in stool.  Endocrine: Negative for increased urination.  Genitourinary:  Positive for involuntary urination.  Musculoskeletal:  Positive for joint pain, gait problem, joint pain, joint swelling, myalgias, muscle weakness, morning stiffness, muscle tenderness and myalgias.  Skin:  Positive for sensitivity to sunlight. Negative for color change, rash and hair loss.  Allergic/Immunologic: Positive for susceptible to infections.  Neurological:  Positive for headaches. Negative for dizziness.  Hematological:  Negative for swollen glands.  Psychiatric/Behavioral:  Positive for sleep disturbance. Negative for depressed mood. The patient is nervous/anxious.     PMFS History:  Patient Active Problem List   Diagnosis Date Noted   Cerebrovascular accident (CVA) (HCC) 07/27/2023   Rheumatoid factor positive 03/05/2023   Anxiety 07/23/2022   Primary hypertension 07/23/2022   Pure hypercholesterolemia 07/23/2022   Spastic hemiparesis of right dominant side (HCC) 09/08/2021   Late effect of cerebrovascular accident (CVA) 09/08/2021   Chronic migraine w/o aura w/o  status migrainosus, not intractable 01/27/2021   History of multiple allergies 01/31/2018   Compression fracture of thoracic vertebra (HCC) 09/27/2017   Carpal tunnel syndrome of left wrist 10/16/2016   Allergic rhinitis 10/09/2013   GERD (gastroesophageal reflux disease) 10/09/2013   Foot drop, right 10/09/2013   Rosacea, acne 10/09/2013   Seizure disorder (HCC) 10/09/2013    Past Medical History:  Diagnosis Date   Allergy    DJD (degenerative joint disease)    GERD (gastroesophageal reflux disease)    Headache    Stroke (HCC) 1999   S/p childbirth-weakness rt leg   Thoracic injuries    Wears glasses     Family History  Problem Relation Age of Onset   Cancer Mother        lung   Arthritis Mother    Heart disease Father    Cancer Father        lung    Hypertension Father    Heart disease Brother    Hypertension Brother    Diabetes Other    Arthritis/Rheumatoid Other    Breast cancer Neg Hx    Past Surgical History:  Procedure Laterality Date   ARTHROSCOPY KNEE W/ DRILLING  2008  rt   REPLACEMENT TOTAL KNEE Right 2009   SEPTOPLASTY Bilateral 07/21/2013   Procedure: SEPTOPLASTY;  Surgeon: Suzanna Obey, MD;  Location: [facility];  Service: ENT;  Laterality: Bilateral;   SINUS ENDO W/FUSION Bilateral 07/21/2013   Procedure: ENDOSCOPIC SINUS SURGERY WITH FUSION NAVIGATION;  Surgeon: Suzanna Obey, MD;  Location: [facility];  Service: ENT;  Laterality: Bilateral;   TUBAL LIGATION  2000   Social History   Social History Narrative   Lives at home with son   Married, 1 son and 1 stepson , 1 daughter    Right handed   10 th grade   3-4 cups caffeine per day.   Immunization History  Administered Date(s) Administered   Influenza Split 03/15/2013   Tdap 09/01/2017     Objective: Vital Signs: BP 119/81 (BP Location: Left Arm, Patient Position: Sitting, Cuff Size: Normal)   Pulse 77   Resp 16   Ht 5' 4.5" (1.638 m)   Wt 143 lb (64.9 kg)    BMI 24.17 kg/m    Physical Exam Eyes:     Conjunctiva/sclera: Conjunctivae normal.  Cardiovascular:     Rate and Rhythm: Normal rate and regular rhythm.  Pulmonary:     Effort: Pulmonary effort is normal.     Breath sounds: Normal breath sounds.  Skin:    General: Skin is warm and dry.  Neurological:     Mental Status: She is alert.  Psychiatric:        Mood and Affect: Mood normal.      Musculoskeletal Exam:  Neck stiffness with lateral rotation to both sides B/l shoulder soreness to pressure, pain raising left arm but without radiation down arm Left elbow tenderness to pressure, no focal symptoms at over cubital tunnel Wrists full ROM no swelling, left wrist tenderness to percussion radiating into hand Fingers full ROM, no palpable swelling   Investigation: No additional findings.  Imaging: US Guided Needle Placement Result Date: 09/08/2023 Left wrist carpal tunnel injection Ultrasound guided injection is preferred based studies that show increased duration, increased effect, greater accuracy, decreased procedural pain, increased response rate, and decreased cost with ultrasound guided versus blind injection. Verbal informed consent obtained.  Time-out conducted.  Noted no overlying erythema, induration, or other signs of local infection. Ultrasound-guided left wrist carpal tunnel injection. After sterile prep with Betadine, injected 1 mL 1% lidocaine and 40 mg kenalog using a 27g needle by radial approach. Image 1 shows advancement of needle next to median nerve. Images 2-5 show injection of medication into carpal tunnel space.  Images 6 and 7 show injection of remaining medication after further advancement of needle.    Recent Labs: Lab Results  Component Value Date   WBC 7.1 07/23/2023   HGB 14.6 07/23/2023   PLT 235 07/23/2023   NA 140 07/23/2023   K 4.8 07/23/2023   CL 103 07/23/2023   CO2 23 07/23/2023   GLUCOSE 74 07/23/2023   BUN 15 07/23/2023   CREATININE  0.96 07/23/2023   BILITOT 0.8 07/23/2023   ALKPHOS 90 07/23/2023   AST 15 07/23/2023   ALT 7 07/23/2023   PROT 6.9 07/23/2023   ALBUMIN 4.6 07/23/2023   CALCIUM 9.4 07/23/2023   GFRAA 92 06/17/2020    Speciality Comments: No specialty comments available.  Procedures:  Hand/UE Inj: L carpal tunnel for carpal tunnel syndrome on 09/08/2023 1:25 PM Details: 27 G needle, ultrasound-guided radial approach Medications: 1 mL lidocaine 1 %; 40 mg triamcinolone acetonide  40 MG/ML Outcome: tolerated well, no immediate complications Procedure, treatment alternatives, risks and benefits explained, specific risks discussed. Consent was given by the patient. Immediately prior to procedure a time out was called to verify the correct patient, procedure, equipment, support staff and site/side marked as required. Patient was prepped and draped in the usual sterile fashion.     Allergies: Codeine   Assessment / Plan:     Visit Diagnoses: Carpal tunnel syndrome of left wrist - S/P Hand/UE Inj: L carpal tunnel for carpal tunnel syndrome on 04/12/2023 - Plan: US Guided Needle Placement, Hand/UE Inj: L carpal tunnel Recurrent left carpal tunnel syndrome with tingling and swelling, probably exacerbated by cane use due to hemiparesis. Previous corticosteroid injection effective but symptoms recurred. Wrist brace minimally effective. Surgery may be more effective long-term solution if symptoms are continue to return after only a few months.  She is very averse to any aggressive treatment right now referred to repeat injection and work on exercises and splinting on her own.  She has an excellent therapy office but has reports financial strain due to co-pays and prefer to hold off on formal therapy referral at this time. - Administer corticosteroid injection to left wrist. - Provide printed exercises for home management. - Discuss potential referral to occupational therapy if symptoms persist or worsen- can call in for  this as needed.  Orders: Orders Placed This Encounter  Procedures   Hand/UE Inj: L carpal tunnel   US Guided Needle Placement   No orders of the defined types were placed in this encounter.    Follow-Up Instructions: Return in about 6 months (around 03/09/2024), or if symptoms worsen or fail to improve.   Fuller Plan, MD  Note - This record has been created using [employer].  Chart creation errors have been sought, but may not always  have been located. Such creation errors do not reflect on  the standard of medical care.

## 2023-09-08 ENCOUNTER — Ambulatory Visit: Payer: PPO | Attending: Internal Medicine | Admitting: Internal Medicine

## 2023-09-08 ENCOUNTER — Ambulatory Visit

## 2023-09-08 ENCOUNTER — Encounter: Payer: Self-pay | Admitting: Internal Medicine

## 2023-09-08 VITALS — BP 119/81 | HR 77 | Resp 16 | Ht 64.5 in | Wt 143.0 lb

## 2023-09-08 DIAGNOSIS — G8111 Spastic hemiplegia affecting right dominant side: Secondary | ICD-10-CM | POA: Diagnosis not present

## 2023-09-08 DIAGNOSIS — S22000S Wedge compression fracture of unspecified thoracic vertebra, sequela: Secondary | ICD-10-CM

## 2023-09-08 DIAGNOSIS — R768 Other specified abnormal immunological findings in serum: Secondary | ICD-10-CM

## 2023-09-08 DIAGNOSIS — G5602 Carpal tunnel syndrome, left upper limb: Secondary | ICD-10-CM | POA: Diagnosis not present

## 2023-09-08 MED ORDER — TRIAMCINOLONE ACETONIDE 40 MG/ML IJ SUSP
40.0000 mg | INTRAMUSCULAR | Status: AC | PRN
  Administered 2023-09-08: 40 mg

## 2023-09-08 MED ORDER — LIDOCAINE HCL 1 % IJ SOLN
1.0000 mL | INTRAMUSCULAR | Status: AC | PRN
  Administered 2023-09-08: 1 mL

## 2023-09-09 ENCOUNTER — Other Ambulatory Visit: Payer: Self-pay | Admitting: Nurse Practitioner

## 2023-09-14 ENCOUNTER — Other Ambulatory Visit: Payer: Self-pay | Admitting: Nurse Practitioner

## 2023-09-14 DIAGNOSIS — F5101 Primary insomnia: Secondary | ICD-10-CM

## 2023-09-17 ENCOUNTER — Encounter

## 2023-10-21 ENCOUNTER — Telehealth: Payer: Self-pay | Admitting: Nurse Practitioner

## 2023-10-21 NOTE — Telephone Encounter (Signed)
 Spoke with patient to schedule AWV - she asked that someone call her about calling her in a new medication to help or either seeing her earlier than August.  She said that she has recently separated from her husband and will be moving.   Thank you,  Trevor Fudge,  AMB Clinical Support [facility] AWV Program Direct Dial ??[phone]

## 2023-10-21 NOTE — Telephone Encounter (Signed)
 Spoke to patient. She expressed interest in restarting her depression medication. She is having a tough time.  Appointment scheduled with her PCP on Monday the 19th, 2025.

## 2023-10-25 ENCOUNTER — Ambulatory Visit (INDEPENDENT_AMBULATORY_CARE_PROVIDER_SITE_OTHER): Admitting: Nurse Practitioner

## 2023-10-25 ENCOUNTER — Encounter: Payer: Self-pay | Admitting: Nurse Practitioner

## 2023-10-25 VITALS — BP 119/81 | HR 79 | Temp 97.8°F | Ht 64.0 in | Wt 143.0 lb

## 2023-10-25 DIAGNOSIS — F32A Depression, unspecified: Secondary | ICD-10-CM | POA: Diagnosis not present

## 2023-10-25 DIAGNOSIS — F419 Anxiety disorder, unspecified: Secondary | ICD-10-CM | POA: Diagnosis not present

## 2023-10-25 DIAGNOSIS — F10230 Alcohol dependence with withdrawal, uncomplicated: Secondary | ICD-10-CM

## 2023-10-25 DIAGNOSIS — Z205 Contact with and (suspected) exposure to viral hepatitis: Secondary | ICD-10-CM

## 2023-10-25 DIAGNOSIS — F1920 Other psychoactive substance dependence, uncomplicated: Secondary | ICD-10-CM

## 2023-10-25 MED ORDER — CITALOPRAM HYDROBROMIDE 20 MG PO TABS: 20.0000 mg | ORAL_TABLET | Freq: Every day | ORAL | 5 refills | Status: AC

## 2023-10-25 NOTE — Addendum Note (Signed)
 Addended by: Delfina Feller on: 10/25/2023 12:31 PM   Modules accepted: Orders

## 2023-10-25 NOTE — Addendum Note (Signed)
 Addended by: Delfina Feller on: 10/25/2023 12:30 PM   Modules accepted: Orders

## 2023-10-25 NOTE — Patient Instructions (Signed)
 Illegal Drug Use Information, Adult Illegal drugs are chemicals and substances that are illegal to use, sell, or have (possess). Health care providers and pharmacies do not use or carry these types of drugs to treat medical problems because they can cause serious side effects and can lead to death. Examples of illegal drugs include: Cocaine or crack. Meth (methamphetamine) or crystal meth. "Bath salts" (synthetic cathinones). Heroin. LSD or acid. PCP (phencyclidine). Ecstasy. What is drug dependence? Using illegal drugs often leads to dependence or addiction. When you use certain drugs over a long period of time, your brain chemistry changes so that you can no longer function normally without that drug. This is called drug dependence. Drug dependence can cause you to: Have unpleasant feelings and physical problems when you stop using the drug (withdrawal). Be unable to perform at work or at home, or do the activities you used to do, without using the drug. Some drugs make people feel so good that they want to use the drug again and again. This is called drug addiction. People who are addicted spend a lot of time seeking out the drug so that they can get the feeling they want from it. Addiction and dependence can be very hard to overcome. How can illegal drug use and dependence affect me? Using an illegal drug only once can have a major impact on your life. It is possible to die from side effects after using a drug just once. If you use an illegal drug repeatedly, you may need to take larger and larger doses of the drug to experience the feelings you want. Drug dependency and addiction may lead to: Being unable to care for yourself and others. Trouble with finances due to using your money for drugs. Withdrawal, if you stop using the drug. Negative effects on your relationships and work performance. It causes others not to trust you. If you have children, you could lose custody of them. Behavior  problems, such as: Behaving in ways that do not match your values. Lying and crime, such as stealing. Jail or prison. This can affect your ability to find a good job or continue your education. Health problems such as tooth loss, skin problems, heart and lung disease, and stomach problems. A drug overdose. This is a dangerous situation that requires hospitalization and often leads to death. If you are a woman, you may have problems with your pregnancy, including: Losing the pregnancy early (miscarriage), early delivery (premature birth), or delivering a lifeless infant (stillbirth). Slow or abnormal growth (birth defects) of your unborn child. Giving birth to a newborn who is addicted to illegal drugs. What actions can I take to avoid illegal drug use? To avoid using illegal drugs: Find healthy ways to cope with stress, such as exercise, meditation, or spending time with family and friends. Talk with your health care provider about how you feel and how to cope with stress. Spend time with people who do not use illegal drugs, or make new friends who do not use drugs. Do something else instead of using drugs. You can exercise, take up a hobby, or participate in activities that you can do with others. Do not be afraid to say no if someone offers you an illegal drug. Speak up about why you do not want to use drugs. You can be a positive role model for others. Work with a health care provider or counselor to create a program for yourself to help you deal with various aspects of drug use or  addiction. Where to find more information You can find more information about illegal drug use, dependence, and addiction from: Your health care provider or mental health counselor. Narcotics Anonymous: www.na.org Substance Abuse and Mental Health Services Administration [facility]): Treatment finder: IdentityList.se National helpline: 1-800-662-HELP 306-571-4167) Contact a health care provider if: You  use illegal drugs and you want help to change your addictive behavior. You lose interest in things you used to enjoy, like hobbies or family activities. Your eating or sleeping habits change as a result of drug use. You use medicine to get the same effects as a drug. This is illegal use and can become addictive as well. You stopped illegal drug use previously and you start actively using it again (relapse). Get help right away if: You have thoughts about hurting yourself or others. If you ever feel like you may hurt yourself or others, or have thoughts about taking your own life, get help right away. Go to your nearest emergency department or: Call your local emergency services (911 in the [place] Call a suicide crisis helpline, such as the National Suicide Prevention Lifeline at 289-533-2571 or 988 in the [place] This is open 24 hours a day in the [place] If you're a Veteran: Call 988 and press 1. This is open 24 hours a day. Text the [employer] at 585-410-2176. Summary Illegal drugs are chemicals and substances that are illegal to use, sell, or possess. Using illegal drugs often leads to dependence or addiction. Addiction and dependence can be very hard to overcome. If you use illegal drugs, look for resources to help you quit and find healthy ways to cope with stress. This information is not intended to replace advice given to you by your health care provider. Make sure you discuss any questions you have with your health care provider. Document Revised: 01/07/2023 Document Reviewed: 09/20/2020 Elsevier Patient Education  2024 [employer].

## 2023-10-25 NOTE — Progress Notes (Addendum)
 Subjective:    Patient ID: Cheryl Reeves, female    DOB: [birth year], 53 y.o.   MRN: [id number]  Chief Complaint: Depression   Depression         Patient conmes in today to discuss depression. She says she is falling apart. Hr and her husband split up a couple of weeks ago. She says that have just not been getting along. She stayed with her cousin and while she was gone he changed the locks on the doors and she has not been able to get any of her stuff out. She has an appointment with an attorney this week. Since being with her husband she has become addicted to cocaine and alcohol . She also wants to make sure that she gets tested for hep C because her husband is positive. Has been on wellbutrin  in the past.     10/25/2023   12:03 PM 07/23/2023    8:40 AM 07/23/2023    8:33 AM  Depression screen PHQ 2/9  Decreased Interest 1 1 0  Down, Depressed, Hopeless 1 0 0  PHQ - 2 Score 2 1 0  Altered sleeping 1 1   Tired, decreased energy 1 1   Change in appetite 1 0   Feeling bad or failure about yourself  1 0   Trouble concentrating 0 0   Moving slowly or fidgety/restless 1 0   Suicidal thoughts 0 0   PHQ-9 Score 7 3   Difficult doing work/chores Somewhat difficult Somewhat difficult       10/25/2023   12:03 PM 07/23/2023    8:40 AM 01/21/2023    8:40 AM 12/14/2022   10:08 AM  GAD 7 : Generalized Anxiety Score  Nervous, Anxious, on Edge 1 0 0 0  Control/stop worrying 1 0 0 0  Worry too much - different things 1 0 0 0  Trouble relaxing 1 0 0 1  Restless 1 0 0 1  Easily annoyed or irritable 0 0 0 1  Afraid - awful might happen 0 0 0 0  Total GAD 7 Score 5 0 0 3  Anxiety Difficulty Somewhat difficult Somewhat difficult Not difficult at all Not difficult at all     Patient Active Problem List   Diagnosis Date Noted   Cerebrovascular accident (CVA) (HCC) 07/27/2023   Rheumatoid factor positive 03/05/2023   Anxiety 07/23/2022   Primary hypertension 07/23/2022   Pure  hypercholesterolemia 07/23/2022   Spastic hemiparesis of right dominant side (HCC) 09/08/2021   Late effect of cerebrovascular accident (CVA) 09/08/2021   Chronic migraine w/o aura w/o status migrainosus, not intractable 01/27/2021   History of multiple allergies 01/31/2018   Compression fracture of thoracic vertebra (HCC) 09/27/2017   Carpal tunnel syndrome of left wrist 10/16/2016   Allergic rhinitis 10/09/2013   GERD (gastroesophageal reflux disease) 10/09/2013   Foot drop, right 10/09/2013   Rosacea, acne 10/09/2013   Seizure disorder (HCC) 10/09/2013       Review of Systems  Psychiatric/Behavioral:  Positive for depression.        Objective:   Physical Exam Constitutional:      Appearance: Normal appearance.  Cardiovascular:     Rate and Rhythm: Normal rate and regular rhythm.     Heart sounds: Normal heart sounds.  Pulmonary:     Effort: Pulmonary effort is normal.     Breath sounds: Normal breath sounds.  Skin:    General: Skin is warm.  Neurological:     General: No focal  deficit present.     Mental Status: She is alert and oriented to person, place, and time.  Psychiatric:        Behavior: Behavior normal.    BP 119/81   Pulse 79   Temp 97.8 F (36.6 C) (Temporal)   Ht 5\' 4"  (1.626 m)   Wt 143 lb (64.9 kg)   SpO2 98%   BMI 24.55 kg/m         Assessment & Plan:   Cheryl Reeves in today with chief complaint of Depression   1. Perinatal hepatitis C exposure (Primary) Lab spending STD panel  2. Anxiety and depression Stress management - citalopram  (CELEXA ) 20 MG tablet; Take 1 tablet (20 mg total) by mouth daily.  Dispense: 30 tablet; Refill: 5  3. Drug addiction [facility]) Referral to rehab facility  4. Alcohol  dependence with uncomplicated withdrawal [facility]) Referral to rehab facility    The above assessment and management plan was discussed with the patient. The patient verbalized understanding of and has agreed to the management plan.  Patient is aware to call the clinic if symptoms persist or worsen. Patient is aware when to return to the clinic for a follow-up visit. Patient educated on when it is appropriate to go to the emergency department.   Mary-Margaret Gaylyn Keas, FNP

## 2023-10-26 ENCOUNTER — Ambulatory Visit: Payer: Self-pay | Admitting: Nurse Practitioner

## 2023-10-26 LAB — HEPB+HEPC+HIV PANEL
HIV Screen 4th Generation wRfx: NONREACTIVE
Hep B C IgM: NEGATIVE
Hep B Core Total Ab: NEGATIVE
Hep B E Ab: NONREACTIVE
Hep B E Ag: NEGATIVE
Hep B Surface Ab, Qual: NONREACTIVE
Hep C Virus Ab: NONREACTIVE
Hepatitis B Surface Ag: NEGATIVE

## 2024-01-18 ENCOUNTER — Other Ambulatory Visit: Payer: Self-pay | Admitting: Nurse Practitioner

## 2024-01-18 DIAGNOSIS — K219 Gastro-esophageal reflux disease without esophagitis: Secondary | ICD-10-CM

## 2024-01-19 ENCOUNTER — Ambulatory Visit

## 2024-01-19 VITALS — BP 119/81 | HR 79 | Ht 64.0 in | Wt 143.0 lb

## 2024-01-19 DIAGNOSIS — Z0000 Encounter for general adult medical examination without abnormal findings: Secondary | ICD-10-CM | POA: Diagnosis not present

## 2024-01-19 NOTE — Patient Instructions (Addendum)
 [name] , Thank you for taking time out of your busy schedule to complete your Annual Wellness Visit with me. I enjoyed our conversation and look forward to speaking with you again next year. I, as well as your care team,  appreciate your ongoing commitment to your health goals. Please review the following plan we discussed and let me know if I can assist you in the future. Your Game plan/ To Do List    Referrals: If you haven't heard from the office you've been referred to, please reach out to them at the phone provided.   Follow up Visits: We will see or speak with you next year for your Next Medicare AWV with our clinical staff on 01/19/25 at 8:00a.m. Have you seen your provider in the last 6 months (3 months if uncontrolled diabetes)? Yes  Clinician Recommendations:  Aim for 30 minutes of exercise or brisk walking, 6-8 glasses of water, and 5 servings of fruits and vegetables each day.       This is a list of the screenings recommended for you:  Health Maintenance  Topic Date Due   Pneumococcal Vaccine for age over 28 (1 of 2 - PCV) Never done   Hepatitis B Vaccine (1 of 3 - 19+ 3-dose series) Never done   Zoster (Shingles) Vaccine (1 of 2) Never done   COVID-19 Vaccine (1 - 2024-25 season) Never done   Medicare Annual Wellness Visit  09/16/2023   Pap with HPV screening  09/17/2023   Flu Shot  01/07/2024   Colon Cancer Screening  01/21/2024*   Pneumococcal Vaccine for high risk medical condition (1 of 2 - PCV) 07/22/2024*   Mammogram  02/03/2024   DTaP/Tdap/Td vaccine (2 - Td or Tdap) 09/02/2027   Hepatitis C Screening  Completed   HIV Screening  Completed   HPV Vaccine  Aged Out   Meningitis B Vaccine  Aged Out  *Topic was postponed. The date shown is not the original due date.    Advanced directives: (Declined) Advance directive discussed with you today. Even though you declined this today, please call our office should you change your mind, and we can give you the proper  paperwork for you to fill out. Advance Care Planning is important because it:  [x]  Makes sure you receive the medical care that is consistent with your values, goals, and preferences  [x]  It provides guidance to your family and loved ones and reduces their decisional burden about whether or not they are making the right decisions based on your wishes.  Follow the link provided in your after visit summary or read over the paperwork we have mailed to you to help you started getting your Advance Directives in place. If you need assistance in completing these, please reach out to us  so that we can help you!  See attachments for Preventive Care and Fall Prevention Tips.

## 2024-01-19 NOTE — Progress Notes (Signed)
 Subjective:   Cheryl Reeves is a 53 y.o. who presents for a Medicare Wellness preventive visit.  As a reminder, Annual Wellness Visits don't include a physical exam, and some assessments may be limited, especially if this visit is performed virtually. We may recommend an in-person follow-up visit with your provider if needed.  Visit Complete: Virtual I connected with  Cheryl Reeves on 01/19/24 by a audio enabled telemedicine application and verified that I am speaking with the correct person using two identifiers.  Patient Location: Home  Provider Location: Home Office  I discussed the limitations of evaluation and management by telemedicine. The patient expressed understanding and agreed to proceed.  Vital Signs: Because this visit was a virtual/telehealth visit, some criteria may be missing or patient reported. Any vitals not documented were not able to be obtained and vitals that have been documented are patient reported.  VideoDeclined- This patient declined Librarian, academic. Therefore the visit was completed with audio only.  Persons Participating in Visit: Patient.  AWV Questionnaire: No: Patient Medicare AWV questionnaire was not completed prior to this visit.  Cardiac Risk Factors include: advanced age (>60men, >106 women);hypertension;smoking/ tobacco exposure     Objective:    Today's Vitals   01/19/24 0827 01/19/24 0828  BP: 119/81   Pulse: 79   Weight: 143 lb (64.9 kg)   Height: 5' 4 (1.626 m)   PainSc:  10-Worst pain ever   Body mass index is 24.55 kg/m.     01/19/2024    8:35 AM 09/16/2022    1:24 PM 08/25/2021    2:50 PM 01/17/2021    7:21 AM 01/16/2021    7:13 AM 01/07/2021    2:17 PM 11/07/2020    9:07 AM  Advanced Directives  Does Patient Have a Medical Advance Directive? No No No No No No No  Would patient like information on creating a medical advance directive?  No - Patient declined No - Patient declined No -  Patient declined No - Patient declined      Current Medications (verified) Outpatient Encounter Medications as of 01/19/2024  Medication Sig   aspirin  81 MG tablet Take 81 mg by mouth daily.   atorvastatin  (LIPITOR) 20 MG tablet Take 1 tablet (20 mg total) by mouth daily.   baclofen  (LIORESAL ) 10 MG tablet TAKE ONE (1) TABLET BY MOUTH TWO (2) TIMES DAILY   celecoxib  (CELEBREX ) 200 MG capsule TAKE ONE (1) CAPSULE BY MOUTH 2 TIMES DAILY   citalopram  (CELEXA ) 20 MG tablet Take 1 tablet (20 mg total) by mouth daily.   Cyanocobalamin  (VITAMIN B 12 PO) Take by mouth.   fluconazole  (DIFLUCAN ) 150 MG tablet 1 po q week x 4 weeks   fluticasone  (FLONASE ) 50 MCG/ACT nasal spray USE 2 SPRAYS IN EACH NOSTRIL DAILY   hydrOXYzine  (VISTARIL ) 25 MG capsule TAKE 1 CAPSULE BY MOUTH TWICE DAILY AS NEEDED   ibuprofen  (ADVIL ) 800 MG tablet TAKE 1 TABLET BY MOUTH EVERY 8 HOURS AS NEEDED FOR PAIN   Ibuprofen  200 MG CAPS Take by mouth.   lisinopril  (ZESTRIL ) 20 MG tablet Take 1 tablet (20 mg total) by mouth daily.   Multiple Vitamins-Minerals (MULTIVITAMIN WITH MINERALS) tablet Take 1 tablet by mouth daily.   ondansetron  (ZOFRAN -ODT) 4 MG disintegrating tablet Take 1 tablet (4 mg total) by mouth every 8 (eight) hours as needed.   pantoprazole  (PROTONIX ) 40 MG tablet TAKE ONE (1) TABLET BY MOUTH EVERY DAY   Ubrogepant  (UBRELVY ) 50 MG TABS  Take 1 tab at onset of migraine.  May repeat in 2 hrs, if needed.  Max dose: 2 tabs/day. This is a 30 day prescription.   zolpidem  (AMBIEN ) 5 MG tablet TAKE 1 TABLET BY MOUTH AT BEDTIME AS NEEDED FOR SLEEP   zonisamide  (ZONEGRAN ) 100 MG capsule Take 1 capsule (100 mg total) by mouth at bedtime.   Facility-Administered Encounter Medications as of 01/19/2024  Medication   incobotulinumtoxinA  (XEOMIN ) 100 units injection 300 Units   incobotulinumtoxinA  (XEOMIN ) 100 units injection 300 Units    Allergies (verified) Codeine   History: Past Medical History:  Diagnosis Date    Allergy    DJD (degenerative joint disease)    GERD (gastroesophageal reflux disease)    Headache    Stroke (HCC) 1999   S/p childbirth-weakness rt leg   Thoracic injuries    Wears glasses    Past Surgical History:  Procedure Laterality Date   ARTHROSCOPY KNEE W/ DRILLING  2008   rt   REPLACEMENT TOTAL KNEE Right 2009   SEPTOPLASTY Bilateral 07/21/2013   Procedure: SEPTOPLASTY;  Surgeon: Norleen Notice, MD;  Location: [facility];  Service: ENT;  Laterality: Bilateral;   SINUS ENDO W/FUSION Bilateral 07/21/2013   Procedure: ENDOSCOPIC SINUS SURGERY WITH FUSION NAVIGATION;  Surgeon: Norleen Notice, MD;  Location: [facility];  Service: ENT;  Laterality: Bilateral;   TUBAL LIGATION  2000   Family History  Problem Relation Age of Onset   Cancer Mother        lung   Arthritis Mother    Heart disease Father    Cancer Father        lung    Hypertension Father    Heart disease Brother    Hypertension Brother    Diabetes Other    Arthritis/Rheumatoid Other    Breast cancer Neg Hx    Social History   Socioeconomic History   Marital status: Married    Spouse name: Cheryl Reeves   Number of children: 2   Years of education: 10   Highest education level: 10th grade  Occupational History   Occupation: Disabled  Tobacco Use   Smoking status: Every Day    Current packs/day: 0.50    Average packs/day: 0.5 packs/day for 30.0 years (15.0 ttl pk-yrs)    Types: Cigarettes   Smokeless tobacco: Never  Vaping Use   Vaping status: Never Used  Substance and Sexual Activity   Alcohol  use: Yes    Alcohol /week: 4.0 standard drinks of alcohol     Types: 4 Cans of beer per week    Comment: occ   Drug use: Yes    Types: Marijuana    Comment: occasionally   Sexual activity: Yes  Other Topics Concern   Not on file  Social History Narrative   Lives at home with son   Married, 1 son and 1 stepson , 1 daughter    Right handed   10 th grade   3-4 cups caffeine  per day.    Social Drivers of Corporate investment banker Strain: Low Risk  (01/19/2024)   Overall Financial Resource Strain (CARDIA)    Difficulty of Paying Living Expenses: Not hard at all  Food Insecurity: No Food Insecurity (01/19/2024)   Hunger Vital Sign    Worried About Running Out of Food in the Last Year: Never true    Ran Out of Food in the Last Year: Never true  Transportation Needs: No Transportation Needs (09/16/2022)   PRAPARE - Transportation  Lack of Transportation (Medical): No    Lack of Transportation (Non-Medical): No  Physical Activity: Insufficiently Active (01/19/2024)   Exercise Vital Sign    Days of Exercise per Week: 2 days    Minutes of Exercise per Session: 10 min  Stress: No Stress Concern Present (01/19/2024)   [employer] of Occupational Health - Occupational Stress Questionnaire    Feeling of Stress: Only a little  Social Connections: Moderately Isolated (01/19/2024)   Social Connection and Isolation Panel    Frequency of Communication with Friends and Family: More than three times a week    Frequency of Social Gatherings with Friends and Family: Twice a week    Attends Religious Services: More than 4 times per year    Active Member of [employer] or Organizations: No    Attends Engineer, structural: Never    Marital Status: Separated    Tobacco Counseling Ready to quit: No Counseling given: Yes    Clinical Intake:  Pre-visit preparation completed: Yes  Pain : 0-10 (Left hand carple-pain keeps her up/plus ankles swollen will see pcp tom) Pain Score: 10-Worst pain ever Pain Type: Chronic pain Pain Location: Hand Pain Orientation: Left Pain Descriptors / Indicators: Aching Pain Onset: More than a month ago Pain Frequency: Constant Pain Relieving Factors: ibuprofen /tylenol   Pain Relieving Factors: ibuprofen /tylenol   BMI - recorded: 24.55 Nutritional Status: BMI of 19-24  Normal Nutritional Risks: None Diabetes: No  No results found  for: HGBA1C   How often do you need to have someone help you when you read instructions, pamphlets, or other written materials from your doctor or pharmacy?: 1 - Never  Interpreter Needed?: No  Information entered by :: alia t/cma   Activities of Daily Living     01/19/2024    8:34 AM  In your present state of health, do you have any difficulty performing the following activities:  Hearing? 0  Vision? 0  Difficulty concentrating or making decisions? 0  Walking or climbing stairs? 0  Dressing or bathing? 0  Doing errands, shopping? 0  Preparing Food and eating ? N  Using the Toilet? N  In the past six months, have you accidently leaked urine? Y  Do you have problems with loss of bowel control? Y  Managing your Medications? N  Managing your Finances? N  Housekeeping or managing your Housekeeping? N    Patient Care Team: Gladis Mustard, FNP as PCP - General (Family Medicine) Onita Duos, MD as Consulting Physician (Neurology)  I have updated your Care Teams any recent Medical Services you may have received from other providers in the past year.     Assessment:   This is a routine wellness examination for Gabriel.  Hearing/Vision screen Hearing Screening - Comments:: Pt denies hearing dif Vision Screening - Comments:: Pt wear glasses/pt goes to Dr. Victor in [place], [place]/last ov 2024   Goals Addressed   None    Depression Screen     01/19/2024    8:40 AM 10/25/2023   12:03 PM 07/23/2023    8:40 AM 07/23/2023    8:33 AM 07/15/2023    9:08 AM 01/21/2023    8:40 AM 12/14/2022   10:08 AM  PHQ 2/9 Scores  PHQ - 2 Score 0 2 1 0 0 0 1  PHQ- 9 Score  7 3   3 3     Fall Risk     01/19/2024    8:31 AM 10/25/2023   12:02 PM 07/23/2023    8:40 AM  07/23/2023    8:33 AM 01/21/2023    8:40 AM  Fall Risk   Falls in the past year? 1 1 1  0 1  Number falls in past yr: 0 1 0  1  Injury with Fall? 1 0 0  1  Risk for fall due to :  History of fall(s) History of fall(s)  History  of fall(s)  Follow up  Education provided Falls evaluation completed  Education provided    MEDICARE RISK AT HOME:  Medicare Risk at Home Any stairs in or around the home?: Yes If so, are there any without handrails?: Yes Home free of loose throw rugs in walkways, pet beds, electrical cords, etc?: Yes Adequate lighting in your home to reduce risk of falls?: Yes Life alert?: No Use of a cane, walker or w/c?: Yes Grab bars in the bathroom?: Yes Shower chair or bench in shower?: Yes Elevated toilet seat or a handicapped toilet?: No  TIMED UP AND GO:  Was the test performed?  no  Cognitive Function: 6CIT completed    02/24/2018    8:27 AM  MMSE - Mini Mental State Exam  Orientation to time 5  Orientation to Place 5  Registration 3  Attention/ Calculation 5  Recall 3  Language- name 2 objects 2  Language- repeat 1  Language- follow 3 step command 3  Language- read & follow direction 1  Write a sentence 1  Copy design 1  Total score 30        01/19/2024    8:35 AM 09/16/2022    1:24 PM 08/13/2020    2:19 PM  6CIT Screen  What Year? 0 points 0 points 0 points  What month? 0 points 0 points 0 points  What time? 0 points 0 points 0 points  Count back from 20 0 points 0 points 0 points  Months in reverse 0 points 0 points 0 points  Repeat phrase 0 points 0 points 0 points  Total Score 0 points 0 points 0 points    Immunizations Immunization History  Administered Date(s) Administered   Influenza Split 03/15/2013   Tdap 09/01/2017    Screening Tests Health Maintenance  Topic Date Due   Pneumococcal Vaccine: 50+ Years (1 of 2 - PCV) Never done   Hepatitis B Vaccines (1 of 3 - 19+ 3-dose series) Never done   Zoster Vaccines- Shingrix (1 of 2) Never done   COVID-19 Vaccine (1 - 2024-25 season) Never done   Cervical Cancer Screening (HPV/Pap Cotest)  09/17/2023   INFLUENZA VACCINE  01/07/2024   Colonoscopy  01/21/2024 (Originally 10/05/2015)   Pneumococcal Vaccine:  19-49 Years (1 of 2 - PCV) 07/22/2024 (Originally 10[birth year]04/1989)   MAMMOGRAM  02/03/2024   Medicare Annual Wellness (AWV)  01/18/2025   DTaP/Tdap/Td (2 - Td or Tdap) 09/02/2027   Hepatitis C Screening  Completed   HIV Screening  Completed   HPV VACCINES  Aged Out   Meningococcal B Vaccine  Aged Out    Health Maintenance  Health Maintenance Due  Topic Date Due   Pneumococcal Vaccine: 50+ Years (1 of 2 - PCV) Never done   Hepatitis B Vaccines (1 of 3 - 19+ 3-dose series) Never done   Zoster Vaccines- Shingrix (1 of 2) Never done   COVID-19 Vaccine (1 - 2024-25 season) Never done   Cervical Cancer Screening (HPV/Pap Cotest)  09/17/2023   INFLUENZA VACCINE  01/07/2024   Health Maintenance Items Addressed: See Nurse Notes at the end of this  note  Additional Screening:  Vision Screening: Recommended annual ophthalmology exams for early detection of glaucoma and other disorders of the eye. Would you like a referral to an eye doctor? No    Dental Screening: Recommended annual dental exams for proper oral hygiene  Community Resource Referral / Chronic Care Management: CRR required this visit?  No   CCM required this visit?  No   Plan:    I have personally reviewed and noted the following in the patient's chart:   Medical and social history Use of alcohol , tobacco or illicit drugs  Current medications and supplements including opioid prescriptions. Patient is not currently taking opioid prescriptions. Functional ability and status Nutritional status Physical activity Advanced directives List of other physicians Hospitalizations, surgeries, and ER visits in previous 12 months Vitals Screenings to include cognitive, depression, and falls Referrals and appointments  In addition, I have reviewed and discussed with patient certain preventive protocols, quality metrics, and best practice recommendations. A written personalized care plan for preventive services as well as general  preventive health recommendations were provided to patient.   Ozie Ned, CMA   01/19/2024   After Visit Summary: (MyChart) Due to this being a telephonic visit, the after visit summary with patients personalized plan was offered to patient via MyChart   Notes: PCP Follow Up Recommendations: Pt is aware and due for the following: pap/pneumonia and shingles vaccine

## 2024-01-20 ENCOUNTER — Encounter: Payer: Self-pay | Admitting: Nurse Practitioner

## 2024-01-20 ENCOUNTER — Ambulatory Visit: Payer: PPO | Admitting: Nurse Practitioner

## 2024-01-20 VITALS — BP 149/86 | HR 56 | Temp 97.9°F | Ht 64.0 in | Wt 142.6 lb

## 2024-01-20 DIAGNOSIS — G40909 Epilepsy, unspecified, not intractable, without status epilepticus: Secondary | ICD-10-CM | POA: Diagnosis not present

## 2024-01-20 DIAGNOSIS — I6930 Unspecified sequelae of cerebral infarction: Secondary | ICD-10-CM | POA: Diagnosis not present

## 2024-01-20 DIAGNOSIS — K219 Gastro-esophageal reflux disease without esophagitis: Secondary | ICD-10-CM | POA: Diagnosis not present

## 2024-01-20 DIAGNOSIS — E7800 Pure hypercholesterolemia, unspecified: Secondary | ICD-10-CM | POA: Diagnosis not present

## 2024-01-20 DIAGNOSIS — G8111 Spastic hemiplegia affecting right dominant side: Secondary | ICD-10-CM

## 2024-01-20 DIAGNOSIS — F419 Anxiety disorder, unspecified: Secondary | ICD-10-CM | POA: Diagnosis not present

## 2024-01-20 DIAGNOSIS — G894 Chronic pain syndrome: Secondary | ICD-10-CM | POA: Diagnosis not present

## 2024-01-20 DIAGNOSIS — M21371 Foot drop, right foot: Secondary | ICD-10-CM

## 2024-01-20 DIAGNOSIS — I10 Essential (primary) hypertension: Secondary | ICD-10-CM | POA: Diagnosis not present

## 2024-01-20 DIAGNOSIS — G43709 Chronic migraine without aura, not intractable, without status migrainosus: Secondary | ICD-10-CM | POA: Diagnosis not present

## 2024-01-20 DIAGNOSIS — Z8673 Personal history of transient ischemic attack (TIA), and cerebral infarction without residual deficits: Secondary | ICD-10-CM

## 2024-01-20 MED ORDER — LISINOPRIL 20 MG PO TABS: 20.0000 mg | ORAL_TABLET | Freq: Every day | ORAL | 1 refills | Status: AC

## 2024-01-20 MED ORDER — GABAPENTIN 300 MG PO CAPS: 300.0000 mg | ORAL_CAPSULE | Freq: Two times a day (BID) | ORAL | 1 refills | Status: AC

## 2024-01-20 MED ORDER — PANTOPRAZOLE SODIUM 40 MG PO TBEC: 40.0000 mg | DELAYED_RELEASE_TABLET | Freq: Every day | ORAL | 1 refills | Status: AC

## 2024-01-20 MED ORDER — BACLOFEN 10 MG PO TABS: ORAL_TABLET | ORAL | 1 refills | Status: AC

## 2024-01-20 MED ORDER — HYDROXYZINE PAMOATE 25 MG PO CAPS
ORAL_CAPSULE | ORAL | 1 refills | Status: AC
Start: 2024-01-20 — End: ?

## 2024-01-20 MED ORDER — ATORVASTATIN CALCIUM 20 MG PO TABS: 20.0000 mg | ORAL_TABLET | Freq: Every day | ORAL | 1 refills | Status: AC

## 2024-01-20 NOTE — Patient Instructions (Signed)
Chronic Pain, Adult Chronic pain is a type of pain that lasts or keeps coming back for at least 3-6 months. You may have headaches, pain in the abdomen, or pain in other areas of the body. Chronic pain may be related to an illness, injury, or a health condition. Sometimes, the cause of chronic pain is not known. Chronic pain can make it hard for you to do daily activities. If it is not treated, chronic pain can lead to anxiety and depression. Treatment depends on the cause of your pain and how severe it is. You may need to work with a pain specialist to come up with a treatment plan. Many people benefit from two or more types of treatment to control their pain. Follow these instructions at home: Treatment plan Follow your treatment plan as told by your health care provider. This may include: Gentle, regular exercise. Eating a healthy diet that includes foods such as vegetables, fruits, fish, and lean meats. Mental health therapy (cognitive or behavioral therapy) that changes the way you think or act in response to the pain. This may help improve how you feel. Doing physical therapy exercises to improve movement and strength. Meditation, yoga, acupuncture, or massage therapy. Using the oils from plants in your environment or on your skin (aromatherapy). Other treatments may include: Over-the-counter or prescription medicines. Color, light, or sound therapy. Local electrical stimulation. The electrical pulses help to relieve pain by temporarily stopping the nerve impulses that cause you to feel pain. Injections. These deliver numbing or pain-relieving medicines into the spine or the area of pain.  Medicines Take over-the-counter and prescription medicines only as told by your health care provider. Ask your health care provider if the medicine prescribed to you: Requires you to avoid driving or using machinery. Can cause constipation. You may need to take these actions to prevent or treat  constipation: Drink enough fluid to keep your urine pale yellow. Take over-the-counter or prescription medicines. Eat foods that are high in fiber, such as beans, whole grains, and fresh fruits and vegetables. Limit foods that are high in fat and processed sugars, such as fried or sweet foods. Lifestyle  Ask your health care provider whether you should keep a pain diary. Your health care provider will tell you what information to write in the diary. This may include: When you have pain. What the pain feels like. How medicines and other behaviors or treatments help to reduce the pain. Consider talking with a mental health care provider about how to help manage chronic pain. Consider joining a chronic pain support group. Try to control or lower your stress levels. Talk with your health care provider about ways to do this. General instructions Learn as much as you can about how to manage your chronic pain. Ask your health care provider if an intensive pain rehabilitation program or a chronic pain specialist would be helpful. Check your pain level as told by your health care provider. Ask your health care provider if you should use a pain scale. Contact a health care provider if: Your pain is not controlled with treatment. You have new pain. You have side effects from pain medicine. You feel weak or you have trouble doing your normal activities. You have trouble sleeping or you develop confusion. You lose feeling or have numbness in your body. You lose control of your bowels or bladder. Get help right away if: Your pain suddenly gets much worse. You develop chest pain. You have trouble breathing or shortness of  breath. You faint, or another person sees you faint. These symptoms may be an emergency. Get help right away. Call 911. Do not wait to see if the symptoms will go away. Do not drive yourself to the hospital. Also, get help right away if: You have thoughts about hurting yourself  or others. Take one of these steps if you feel like you may hurt yourself or others, or have thoughts about taking your own life: Go to your nearest emergency room. Call 911. Call the National Suicide Prevention Lifeline at 613-055-8912 or 988. This is open 24 hours a day. Text the Crisis Text Line at 323-193-1954. This information is not intended to replace advice given to you by your health care provider. Make sure you discuss any questions you have with your health care provider. Document Revised: 01/14/2022 Document Reviewed: 12/17/2021 Elsevier Patient Education  2024 [employer].
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
## 2024-01-21 ENCOUNTER — Ambulatory Visit: Payer: Self-pay | Admitting: Nurse Practitioner

## 2024-01-21 LAB — CMP14+EGFR
ALT: 6 IU/L (ref 0–32)
AST: 19 IU/L (ref 0–40)
Albumin: 4.1 g/dL (ref 3.8–4.9)
Alkaline Phosphatase: 116 IU/L (ref 44–121)
BUN/Creatinine Ratio: 12 (ref 9–23)
BUN: 12 mg/dL (ref 6–24)
Bilirubin Total: 0.6 mg/dL (ref 0.0–1.2)
CO2: 23 mmol/L (ref 20–29)
Calcium: 9.3 mg/dL (ref 8.7–10.2)
Chloride: 103 mmol/L (ref 96–106)
Creatinine, Ser: 0.97 mg/dL (ref 0.57–1.00)
Globulin, Total: 2.4 g/dL (ref 1.5–4.5)
Glucose: 83 mg/dL (ref 70–99)
Potassium: 3.9 mmol/L (ref 3.5–5.2)
Sodium: 141 mmol/L (ref 134–144)
Total Protein: 6.5 g/dL (ref 6.0–8.5)
eGFR: 70 mL/min/1.73 (ref >59)

## 2024-01-21 LAB — CBC WITH DIFFERENTIAL/PLATELET
Basophils Absolute: 0 x10E3/uL (ref 0.0–0.2)
Basos: 1 %
EOS (ABSOLUTE): 0.1 x10E3/uL (ref 0.0–0.4)
Eos: 2 %
Hematocrit: 45.1 % (ref 34.0–46.6)
Hemoglobin: 14.9 g/dL (ref 11.1–15.9)
Immature Grans (Abs): 0 x10E3/uL (ref 0.0–0.1)
Immature Granulocytes: 0 %
Lymphocytes Absolute: 1.3 x10E3/uL (ref 0.7–3.1)
Lymphs: 25 %
MCH: 32.3 pg (ref 26.6–33.0)
MCHC: 33 g/dL (ref 31.5–35.7)
MCV: 98 fL — ABNORMAL HIGH (ref 79–97)
Monocytes Absolute: 0.5 x10E3/uL (ref 0.1–0.9)
Monocytes: 9 %
Neutrophils Absolute: 3.2 x10E3/uL (ref 1.4–7.0)
Neutrophils: 63 %
Platelets: 195 x10E3/uL (ref 150–450)
RBC: 4.62 x10E6/uL (ref 3.77–5.28)
RDW: 11.9 % (ref 11.7–15.4)
WBC: 5.1 x10E3/uL (ref 3.4–10.8)

## 2024-01-21 LAB — LIPID PANEL
Chol/HDL Ratio: 2.7 ratio (ref 0.0–4.4)
Cholesterol, Total: 154 mg/dL (ref 100–199)
HDL: 57 mg/dL (ref >39)
LDL Chol Calc (NIH): 78 mg/dL (ref 0–99)
Triglycerides: 108 mg/dL (ref 0–149)
VLDL Cholesterol Cal: 19 mg/dL (ref 5–40)

## 2024-02-29 NOTE — Progress Notes (Signed)
 Office Visit Note  Patient: Cheryl Reeves             Date of Birth: 24-Jul-1970           MRN: [id number]             PCP: Gladis Mustard, FNP Referring: Gladis Mustard, * Visit Date: 03/14/2024   Subjective:  Pain (Hands are getting worse , feeling some weakness in the left hand causing her to have trouble opening things. ) and Joint Pain (Hips, back/spine, shoulders)  Discussed the use of AI scribe software for clinical note transcription with the patient, who gave verbal consent to proceed.  History of Present Illness   Cheryl Reeves is a 53 y.o. female here for follow up carpal tunnel syndrome who presents with worsening symptoms in the left hand and wrist. Also suffering increased joint pain and stiffness in multiple areas that are not associated with swelling or numbness.  Over the past month, she has experienced worsening symptoms in her left hand, including increased stiffness, reduced grip strength, and noticeable swelling. She also reported pain going to her shoulder, neck, hips, back, and spine. A previous left wrist injection in April provided relief for about five months.  She is currently taking gabapentin , primarily at night, to aid with sleep, but finds it minimally effective for pain relief. She takes one pill at night despite being prescribed two, due to concerns about the medication. Additionally, she uses ibuprofen , taking five to six 200 mg tablets at a time, once or twice daily.  She has a family history of rheumatoid arthritis, as her mother had the condition. Previous RF testing was low positive but with negative acute inflammatory markers and no appreciable joint swelling.  During the review of symptoms, she notes that her grip is off, with a tingling sensation and difficulty gripping as she used to. Her pain also disrupts her sleep, particularly when lying on her hip.   Previous HPI 09/08/2023 Cheryl Reeves is a 53 year old female  with carpal tunnel syndrome who presents with worsening symptoms in the left hand and wrist.   She has been experiencing worsening symptoms of carpal tunnel syndrome in her left hand and wrist over the past several weeks. Previously, she received a corticosteroid injection which provided significant relief, but her symptoms have returned. She experiences tingling, particularly at night, and swelling in her left hand. There is also some swelling in her right hand, though it is less severe. She has tried using a wrist brace, which provided minimal relief, but she misplaced it. The brace was obtained from a drugstore after the store where she had a prescription closed down. No recent falls, injuries, or changes in activities have been noted.   She has a history of osteoarthritis, which contributes to joint pain and tenderness, particularly in the left hand and wrist. There is no significant swelling in other joints. A previous rheumatoid factor test was negative, ruling out rheumatoid arthritis.   She uses a cane for stability due to hemiparesis from a stroke in 1999. This condition may influence her carpal tunnel syndrome symptoms due to altered biomechanics and increased use of the left hand for support.     Previous HPI 04/12/2023 Cheryl Reeves is a 53 y.o. female here for follow up for ongoing joint pain in her hands and back and positive rheumatoid factor.  Workup at initial visit was negative for serum inflammatory markers and for CCP antibody.  X-ray  of the hands was consistent with osteoarthritis involving the MCP joints on her left hand.  She has tried doing the range of motion exercises and wearing a rigid wrist brace at night and sometimes in the day without any resolution of symptoms.  Still wakes her up from sleep frequently with hand pain and numbness.    Previous HPI 03/05/23 Cheryl Reeves is a 53 y.o. female here for evaluation of joint pain in multiple areas especially in her  hands and back and with positive rheumatoid factor.  She has somewhat longstanding back pain although this has been worse especially in the past year.  She had a previous stroke with residual spastic hemiparesis of the right side and since then feels she keeps a degree of muscle pain and soreness.  She had right knee replacement in 2009.  She has had chronic pain and stiffness in her hand performed with study for left wrist carpal tunnel syndrome in 2018 showing moderately severe disease.  She tried using a wrist brace with limited benefit also did not find 1 that fit well without causing pressure areas on her hand.  In the past year hand pain is doing worse especially on the left side which she does use much more heavily due to residual right-sided deficit.  She notices swelling at sometimes but not on others not associated with specific time of day position or activity.  She feels like there are nodules or more prominent bumps on the palmar side under her MCP joint.  Does not notice an increase in foot symptoms she does use ankle-foot orthotic for right sided foot drop.  Currently prescribed Celebrex  200 mg twice daily and takes baclofen  10 mg twice daily for spastic paresis.  Also treated with Xeomin  injections and takes ibuprofen  as needed. Besides joint pain she has numerous widespread symptoms including chronic fatigue, dryness of eyes and mouth, intermittent painful mouth sores, alternating constipation and diarrhea, and frequent dizziness and headaches. Does not report cervical axillary lymphadenopathy, Raynaud's symptoms, abnormal bruising or bleeding. Mother had rheumatoid arthritis with deforming arthritis of both hands.  She has some concern regarding RA treatments due to her mother developing lung cancer on long-term methotrexate treatment though she was also a long-term cigarette smoker.     Labs reviewed 07/2022 RF 16.4 ESR 2   Labs reviewed 03/2021 RF 23.3   Review of Systems   Constitutional:  Positive for fatigue.  HENT:  Positive for mouth dryness. Negative for mouth sores.   Eyes:  Negative for dryness.  Respiratory:  Negative for shortness of breath.   Cardiovascular:  Negative for chest pain and palpitations.  Gastrointestinal:  Negative for blood in stool, constipation and diarrhea.  Endocrine: Negative for increased urination.  Genitourinary:  Positive for involuntary urination.  Musculoskeletal:  Positive for joint pain, gait problem, joint pain, joint swelling, myalgias, muscle weakness, morning stiffness, muscle tenderness and myalgias.  Skin:  Positive for hair loss. Negative for color change, rash and sensitivity to sunlight.  Allergic/Immunologic: Positive for susceptible to infections.  Neurological:  Positive for headaches. Negative for dizziness.  Hematological:  Negative for swollen glands.  Psychiatric/Behavioral:  Positive for sleep disturbance. Negative for depressed mood. The patient is not nervous/anxious.     PMFS History:  Patient Active Problem List   Diagnosis Date Noted   History of CVA (cerebrovascular accident) 07/27/2023   Rheumatoid factor positive 03/05/2023   Anxiety 07/23/2022   Primary hypertension 07/23/2022   Pure hypercholesterolemia 07/23/2022   Spastic  hemiparesis of right dominant side (HCC) 09/08/2021   Chronic migraine w/o aura w/o status migrainosus, not intractable 01/27/2021   History of multiple allergies 01/31/2018   Compression fracture of thoracic vertebra (HCC) 09/27/2017   Carpal tunnel syndrome of left wrist 10/16/2016   Allergic rhinitis 10/09/2013   GERD (gastroesophageal reflux disease) 10/09/2013   Foot drop, right 10/09/2013   Rosacea, acne 10/09/2013   Seizure disorder (HCC) 10/09/2013    Past Medical History:  Diagnosis Date   Allergy    DJD (degenerative joint disease)    GERD (gastroesophageal reflux disease)    Headache    Stroke (HCC) 1999   S/p childbirth-weakness rt leg    Thoracic injuries    Wears glasses     Family History  Problem Relation Age of Onset   Cancer Mother        lung   Arthritis Mother    Heart disease Father    Cancer Father        lung    Hypertension Father    Heart disease Brother    Hypertension Brother    Diabetes Other    Arthritis/Rheumatoid Other    Breast cancer Neg Hx    Past Surgical History:  Procedure Laterality Date   ARTHROSCOPY KNEE W/ DRILLING  2008   rt   REPLACEMENT TOTAL KNEE Right 2009   SEPTOPLASTY Bilateral 07/21/2013   Procedure: SEPTOPLASTY;  Surgeon: Norleen Notice, MD;  Location: [facility];  Service: ENT;  Laterality: Bilateral;   SINUS ENDO W/FUSION Bilateral 07/21/2013   Procedure: ENDOSCOPIC SINUS SURGERY WITH FUSION NAVIGATION;  Surgeon: Norleen Notice, MD;  Location: [facility];  Service: ENT;  Laterality: Bilateral;   TUBAL LIGATION  2000   Social History   Social History Narrative   Lives at home with son   Married, 1 son and 1 stepson , 1 daughter    Right handed   10 th grade   3-4 cups caffeine  per day.   Immunization History  Administered Date(s) Administered   Influenza Split 03/15/2013   Tdap 09/01/2017     Objective: Vital Signs: BP 137/85   Pulse 76   Temp 97.8 F (36.6 C)   Resp 16   Ht 5' 4 (1.626 m)   Wt 149 lb 12.8 oz (67.9 kg)   BMI 25.71 kg/m    Physical Exam Eyes:     Conjunctiva/sclera: Conjunctivae normal.  Cardiovascular:     Rate and Rhythm: Normal rate and regular rhythm.  Pulmonary:     Effort: Pulmonary effort is normal.     Breath sounds: Normal breath sounds.  Skin:    General: Skin is warm and dry.  Neurological:     Mental Status: She is alert.  Psychiatric:        Mood and Affect: Mood normal.      Musculoskeletal Exam:  Neck stiffness with lateral rotation to both sides B/l shoulder soreness to pressure, pain raising left arm but without radiation down arm Left elbow tenderness to pressure, no focal symptoms at  over cubital tunnel Wrists full ROM no swelling, left wrist tenderness to percussion radiating into hand, mild swelling visible on flexor side Fingers full ROM, no palpable swelling, tenderness proximal to left 3rd MCP without palpable nodule  Investigation: No additional findings.  Imaging: US  Guided Needle Placement Result Date: 03/14/2024 Left wrist carpal tunnel injection Ultrasound guided injection is preferred based studies that show increased duration, increased effect, greater accuracy, decreased procedural  pain, increased response rate, and decreased cost with ultrasound guided versus blind injection. Verbal informed consent obtained.  Time-out conducted.  Noted no overlying erythema, induration, or other signs of local infection. Ultrasound-guided left carpal tunnel injection. After sterile prep with Betadine, injected 1 mL 1% lidocaine  and 40 mg kenalog  using a 27g needle by radial approach. Image 1 shows advancement of needle into carpal tunnel space. Images 2-3 shows injection of medication into carpal tunnel space.    Recent Labs: Lab Results  Component Value Date   WBC 5.1 01/20/2024   HGB 14.9 01/20/2024   PLT 195 01/20/2024   NA 141 01/20/2024   K 3.9 01/20/2024   CL 103 01/20/2024   CO2 23 01/20/2024   GLUCOSE 83 01/20/2024   BUN 12 01/20/2024   CREATININE 0.97 01/20/2024   BILITOT 0.6 01/20/2024   ALKPHOS 116 01/20/2024   AST 19 01/20/2024   ALT 6 01/20/2024   PROT 6.5 01/20/2024   ALBUMIN 4.1 01/20/2024   CALCIUM  9.3 01/20/2024   GFRAA 92 06/17/2020    Speciality Comments: No specialty comments available.  Procedures:  Hand/UE Inj: L carpal tunnel for carpal tunnel syndrome on 03/14/2024 1:45 PM Indications: pain and therapeutic Details: 27 G needle, radial approach Medications: 1 mL lidocaine  1 %; 40 mg triamcinolone  acetonide 40 MG/ML Procedure, treatment alternatives, risks and benefits explained, specific risks discussed. Consent was given by the  patient. Immediately prior to procedure a time out was called to verify the correct patient, procedure, equipment, support staff and site/side marked as required. Patient was prepped and draped in the usual sterile fashion.     Allergies: Codeine   Assessment / Plan:     Visit Diagnoses: Carpal tunnel syndrome of left wrist - S/P Left carpal tunnel 09/08/2023 - Plan: US  Guided Needle Placement, diclofenac  (VOLTAREN) 75 MG EC tablet Chronic with worsening symptoms. Previous injection effective for five months. Prefers to avoid surgery. - Administer left wrist injection using ultrasound guidance. - Discuss again referral surgical options if symptoms recur frequently.  Rheumatoid factor positive - Plan: diclofenac  (VOLTAREN) 75 MG EC tablet, Sedimentation rate, C-reactive protein, Rheumatoid factor Suspected with positive rheumatoid factor and family history. Symptoms include swelling and joint pain. Differential includes rheumatoid arthritis. - Order repeat rheumatoid factor test and ESR/CRP  Compression fracture of thoracic vertebra, unspecified thoracic vertebral level, sequela Chronic pain, multiple sites Chronic pain in shoulders, neck, hips, and spine. Gabapentin  inadequate. High-dose ibuprofen  risks gastrointestinal issues. Diclofenac  considered safer. - Prescribe diclofenac  sodium 75 mg as alternative to ibuprofen . - Educate on risks of high-dose ibuprofen , including potential for stomach ulcers.       Orders: Orders Placed This Encounter  Procedures   Hand/UE Inj   US  Guided Needle Placement   Sedimentation rate   C-reactive protein   Rheumatoid factor   Meds ordered this encounter  Medications   diclofenac  (VOLTAREN) 75 MG EC tablet    Sig: Take 1 tablet (75 mg total) by mouth every 12 (twelve) hours as needed. Do not combine with celebrex  or ibuprofen     Dispense:  60 tablet    Refill:  1     Follow-Up Instructions: Return in about 3 months (around 06/14/2024), or if  symptoms worsen or fail to improve, for CTS/?RA NSAID/inj f/u 3mos.   Lonni LELON Ester, MD  Note - This record has been created using [employer].  Chart creation errors have been sought, but may not always  have been located. Such creation errors do  not reflect on  the standard of medical care.

## 2024-03-14 ENCOUNTER — Encounter: Payer: Self-pay | Admitting: Internal Medicine

## 2024-03-14 ENCOUNTER — Ambulatory Visit (INDEPENDENT_AMBULATORY_CARE_PROVIDER_SITE_OTHER)

## 2024-03-14 ENCOUNTER — Ambulatory Visit: Attending: Internal Medicine | Admitting: Internal Medicine

## 2024-03-14 VITALS — BP 137/85 | HR 76 | Temp 97.8°F | Resp 16 | Ht 64.0 in | Wt 149.8 lb

## 2024-03-14 DIAGNOSIS — G5602 Carpal tunnel syndrome, left upper limb: Secondary | ICD-10-CM | POA: Diagnosis not present

## 2024-03-14 DIAGNOSIS — R7689 Other specified abnormal immunological findings in serum: Secondary | ICD-10-CM | POA: Diagnosis not present

## 2024-03-14 DIAGNOSIS — S22000S Wedge compression fracture of unspecified thoracic vertebra, sequela: Secondary | ICD-10-CM

## 2024-03-14 MED ORDER — DICLOFENAC SODIUM 75 MG PO TBEC: 75.0000 mg | DELAYED_RELEASE_TABLET | Freq: Two times a day (BID) | ORAL | 1 refills | Status: AC | PRN

## 2024-03-14 MED ORDER — LIDOCAINE HCL 1 % IJ SOLN
1.0000 mL | INTRAMUSCULAR | Status: AC | PRN
  Administered 2024-03-14: 1 mL

## 2024-03-14 MED ORDER — TRIAMCINOLONE ACETONIDE 40 MG/ML IJ SUSP
40.0000 mg | INTRAMUSCULAR | Status: AC | PRN
  Administered 2024-03-14: 40 mg

## 2024-03-15 ENCOUNTER — Ambulatory Visit: Payer: Self-pay | Admitting: Internal Medicine

## 2024-03-15 LAB — C-REACTIVE PROTEIN: CRP: <3 mg/L (ref <8.0)

## 2024-03-15 LAB — SEDIMENTATION RATE: Sed Rate: 6 mm/h (ref 0–30)

## 2024-03-15 LAB — RHEUMATOID FACTOR: Rheumatoid fact SerPl-aCnc: 14 [IU]/mL — ABNORMAL HIGH (ref <14)

## 2024-03-15 NOTE — Progress Notes (Signed)
 Rheumatoid factor decreased to 14 which is borderline. Sed rate and CRP were normal. Based on this I don't see evidence of RA causing her symptoms.

## 2024-03-28 NOTE — Progress Notes (Signed)
 Pharmacy Quality Measure Review  This patient is appearing on a report for being at risk of failing the adherence measure for hypertension (ACEi/ARB) medications this calendar year.   Medication: lisinopril  20 mg daily Last fill date: 03/10/2024 for 90 day supply  Insurance report was not up to date. No action needed at this time.   Woodie Jock, PharmD PGY1 Pharmacy Resident  03/28/2024

## 2024-06-14 ENCOUNTER — Other Ambulatory Visit: Payer: Self-pay | Admitting: Neurology

## 2024-06-14 NOTE — Telephone Encounter (Signed)
 Last seen on 07/27/23 No follow up scheduled

## 2024-06-19 ENCOUNTER — Ambulatory Visit: Admitting: Internal Medicine

## 2025-01-19 ENCOUNTER — Ambulatory Visit: Payer: Self-pay
